# Patient Record
Sex: Female | Born: 1980
Health system: Southern US, Community
[De-identification: ages and names within clinical notes are randomized; demographics above are authoritative.]

## PROBLEM LIST (undated history)

## (undated) DIAGNOSIS — D649 Anemia, unspecified: Secondary | ICD-10-CM

## (undated) DIAGNOSIS — K922 Gastrointestinal hemorrhage, unspecified: Secondary | ICD-10-CM

## (undated) DIAGNOSIS — F419 Anxiety disorder, unspecified: Secondary | ICD-10-CM

## (undated) DIAGNOSIS — F329 Major depressive disorder, single episode, unspecified: Secondary | ICD-10-CM

## (undated) DIAGNOSIS — Z5189 Encounter for other specified aftercare: Secondary | ICD-10-CM

## (undated) DIAGNOSIS — F32A Depression, unspecified: Secondary | ICD-10-CM

## (undated) DIAGNOSIS — K529 Noninfective gastroenteritis and colitis, unspecified: Secondary | ICD-10-CM

## (undated) DIAGNOSIS — Z862 Personal history of diseases of the blood and blood-forming organs and certain disorders involving the immune mechanism: Secondary | ICD-10-CM

## (undated) DIAGNOSIS — D689 Coagulation defect, unspecified: Secondary | ICD-10-CM

## (undated) HISTORY — PX: COSMETIC SURGERY: SHX468

## (undated) HISTORY — DX: Personal history of diseases of the blood and blood-forming organs and certain disorders involving the immune mechanism: Z86.2

## (undated) HISTORY — DX: Anxiety disorder, unspecified: F41.9

## (undated) HISTORY — DX: Anemia, unspecified: D64.9

## (undated) HISTORY — DX: Encounter for other specified aftercare: Z51.89

## (undated) HISTORY — PX: OTHER SURGICAL HISTORY: SHX169

## (undated) HISTORY — PX: BREAST ENHANCEMENT SURGERY: SHX7

## (undated) HISTORY — DX: Major depressive disorder, single episode, unspecified: F32.9

## (undated) HISTORY — DX: Gastrointestinal hemorrhage, unspecified: K92.2

## (undated) HISTORY — DX: Coagulation defect, unspecified: D68.9

## (undated) HISTORY — DX: Depression, unspecified: F32.A

## (undated) HISTORY — DX: Noninfective gastroenteritis and colitis, unspecified: K52.9

---

## 2012-12-06 ENCOUNTER — Other Ambulatory Visit: Payer: Self-pay | Admitting: Physician Assistant

## 2012-12-06 DIAGNOSIS — Z83438 Family history of other disorder of lipoprotein metabolism and other lipidemia: Secondary | ICD-10-CM

## 2012-12-06 DIAGNOSIS — Z131 Encounter for screening for diabetes mellitus: Secondary | ICD-10-CM

## 2012-12-06 DIAGNOSIS — Z13220 Encounter for screening for lipoid disorders: Secondary | ICD-10-CM

## 2012-12-06 NOTE — Progress Notes (Signed)
Coming for visit tomorrow.

## 2012-12-07 ENCOUNTER — Telehealth: Payer: Self-pay | Admitting: Physician Assistant

## 2012-12-07 ENCOUNTER — Ambulatory Visit (INDEPENDENT_AMBULATORY_CARE_PROVIDER_SITE_OTHER): Payer: 59 | Admitting: Physician Assistant

## 2012-12-07 ENCOUNTER — Encounter: Payer: Self-pay | Admitting: Physician Assistant

## 2012-12-07 VITALS — BP 113/74 | HR 101 | Wt 127.0 lb

## 2012-12-07 DIAGNOSIS — G43109 Migraine with aura, not intractable, without status migrainosus: Secondary | ICD-10-CM | POA: Insufficient documentation

## 2012-12-07 DIAGNOSIS — I493 Ventricular premature depolarization: Secondary | ICD-10-CM

## 2012-12-07 DIAGNOSIS — R5381 Other malaise: Secondary | ICD-10-CM

## 2012-12-07 DIAGNOSIS — R51 Headache: Secondary | ICD-10-CM

## 2012-12-07 DIAGNOSIS — I4949 Other premature depolarization: Secondary | ICD-10-CM

## 2012-12-07 LAB — COMPREHENSIVE METABOLIC PANEL
Alkaline Phosphatase: 39 U/L (ref 39–117)
CO2: 24 mEq/L (ref 19–32)
Creat: 0.8 mg/dL (ref 0.50–1.10)
Glucose, Bld: 83 mg/dL (ref 70–99)
Total Bilirubin: 0.5 mg/dL (ref 0.3–1.2)

## 2012-12-07 LAB — LIPID PANEL
Cholesterol: 179 mg/dL (ref 0–200)
LDL Cholesterol: 107 mg/dL — ABNORMAL HIGH (ref 0–99)
Total CHOL/HDL Ratio: 3.1 Ratio
Triglycerides: 72 mg/dL (ref <150)
VLDL: 14 mg/dL (ref 0–40)

## 2012-12-07 NOTE — Patient Instructions (Addendum)
Premature Ventricular Contraction Premature ventricular contraction (PVC) is an irregularity of the heart rhythm involving extra or skipped heartbeats. In some cases, they may occur without obvious cause or heart disease. Other times, they can be caused by an electrolyte change in the blood. These need to be corrected. They can also be seen when there is not enough oxygen going to the heart. A common cause of this is plaque or cholesterol buildup. This buildup decreases the blood supply to the heart. In addition, extra beats may be caused or aggravated by:  Excessive smoking.  Alcohol consumption.  Caffeine.  Certain medications  Some street drugs. SYMPTOMS   The sensation of feeling your heart skipping a beat (palpitations).  In many cases, the person may have no symptoms. SIGNS AND TESTS   A physical examination may show an occasional irregularity, but if the PVC beats do not happen often, they may not be found on physical exam.  Blood pressure is usually normal.  Other tests that may find extra beats of the heart are:  An EKG (electrocardiogram)  A Holter monitor which can monitor your heart over longer periods of time  An Angiogram (study of the heart arteries). TREATMENT  Usually extra heartbeats do not need treatment. The condition is treated only if symptoms are severe or if extra beats are very frequent or are causing problems. An underlying cause, if discovered, may also require treatment.  Treatment may also be needed if there may be a risk for other more serious cardiac arrhythmias.  PREVENTION   Moderation in caffeine, alcohol, and tobacco use may reduce the risk of ectopic heartbeats in some people.  Exercise often helps people who lead a sedentary (inactive) lifestyle. PROGNOSIS  PVC heartbeats are generally harmless and do not need treatment.  RISKS AND COMPLICATIONS   Ventricular tachycardia (occasionally).  There usually are no complications.  Other  arrhythmias (occasionally). SEEK IMMEDIATE MEDICAL CARE IF:   You feel palpitations that are frequent or continual.  You develop chest pain or other problems such as shortness of breath, sweating, or nausea and vomiting.  You become light-headed or faint (pass out).  You get worse or do not improve with treatment. Document Released: 02/27/2004 Document Revised: 10/04/2011 Document Reviewed: 09/08/2007 [facility] Patient Information 2013 [place], [place].

## 2012-12-07 NOTE — Telephone Encounter (Signed)
Can we call lab and see if we can add CBC and TSH to labs this am.

## 2012-12-07 NOTE — Telephone Encounter (Signed)
Added TSH could not add CBC as no lavendar tube was filled. Barry Dienes, LPN

## 2012-12-07 NOTE — Telephone Encounter (Signed)
Will get POC hgb in office.

## 2012-12-07 NOTE — Progress Notes (Signed)
  Subjective:    Patient ID: Jennifer Abbott, female    DOB: 04-01-1981, 32 y.o.   MRN: [id number]  HPI Patient presents to the clinic to establish care. PMH is negative for any ongoing conditions. She does smoke a black and mild every day. She drinks alcohol occasionally. Family hx positive for hyperlipidemia and heart attacks.   She comes in today wanting blood work to screen for any cholesterol issues.   She also complains of regular headaches.They are not every day but 1-2 a week. They happen usually during work week after lunch. They are located around the top of her head and do not radiate. No n/v, light, or sound sensitive. Ibuprofen works. NO vision changes.denies stress but reports does not happen on weekends. Drinks 2 cups of coffee every morning but water throughout the day.   She has also been having some heart palpitations or feeling like heart is skipping a beat. 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 She has had these feeling for years.they are the worst when she is hung over. She denies any CP, SOB.      Review of Systems  All other systems reviewed and are negative.       Objective:   Physical Exam  Constitutional: She is oriented to person, place, and time. She appears well-developed and well-nourished.  HENT:  Head: Normocephalic and atraumatic.  Neck: Normal range of motion. Neck supple. No thyromegaly present.  Cardiovascular: Normal rate, regular rhythm and normal heart sounds.   Pulmonary/Chest: Effort normal and breath sounds normal.  Lymphadenopathy:    She has no cervical adenopathy.  Neurological: She is alert and oriented to person, place, and time.  Skin: Skin is warm and dry.  Psychiatric: She has a normal mood and affect. Her behavior is normal.          Assessment & Plan:  Screening for lipid/diabetes- sent to lab.  Headaches- They sound like simple headaches. Since ibuprofen  is working I suggest using what works. They do not sound worrisome. There could be a food, stress, dehydration trigger. Stay hydrated. Follow up if headaches worsen or ibuprofen not working.   PVC's- Gave handout on PVC's. Discuss potential causes. Will check TSH, CMP. hgb was 12.5. On low side told to start taking iron daily 325mg  and see if that helps any. If continues will send to cardiology for a holter monitor. Reassured pt.

## 2012-12-11 ENCOUNTER — Encounter: Payer: Self-pay | Admitting: *Deleted

## 2012-12-27 ENCOUNTER — Telehealth: Payer: Self-pay | Admitting: Physician Assistant

## 2012-12-27 MED ORDER — CLINDAMYCIN PHOS-BENZOYL PEROX 1-5 % EX GEL: Freq: Two times a day (BID) | CUTANEOUS | Status: DC

## 2012-12-27 NOTE — Telephone Encounter (Signed)
Pt calls in with acne. Would like a cream called in.

## 2012-12-29 ENCOUNTER — Other Ambulatory Visit: Payer: Self-pay | Admitting: Physician Assistant

## 2012-12-29 ENCOUNTER — Ambulatory Visit (INDEPENDENT_AMBULATORY_CARE_PROVIDER_SITE_OTHER): Payer: 59 | Admitting: Physician Assistant

## 2012-12-29 VITALS — BP 98/64 | HR 71

## 2012-12-29 DIAGNOSIS — R34 Anuria and oliguria: Secondary | ICD-10-CM

## 2012-12-29 DIAGNOSIS — N390 Urinary tract infection, site not specified: Secondary | ICD-10-CM

## 2012-12-29 LAB — POCT URINALYSIS DIPSTICK
Bilirubin, UA: NEGATIVE
Ketones, UA: NEGATIVE
Spec Grav, UA: >=1.03
pH, UA: 6

## 2012-12-29 MED ORDER — CIPROFLOXACIN HCL 500 MG PO TABS: 500.0000 mg | ORAL_TABLET | Freq: Two times a day (BID) | ORAL | Status: DC

## 2012-12-29 NOTE — Progress Notes (Signed)
  Subjective:    Patient ID: Jennifer Abbott, female    DOB: 05-Jul-1981, 32 y.o.   MRN: [id number]  HPI Patient presents to the clinic with decreased urine output for 2 days. She has also felt very fatigued and worn down today. She denies fever, chills, abdominal or back pain. She has not tried anything to make better. She does have a history of getting UTI's after sex. She had sex earlier this week for the first time in a while. She denies any n/v/d.    Review of Systems     Objective:   Physical Exam  Constitutional: She is oriented to person, place, and time. She appears well-developed and well-nourished.  HENT:  Head: Normocephalic and atraumatic.  Cardiovascular: Normal rate, regular rhythm and normal heart sounds.   Pulmonary/Chest: Effort normal and breath sounds normal.  Negative for CVA tenderness.  Abdominal: Soft. There is no tenderness.  Neurological: She is alert and oriented to person, place, and time.  Skin: Skin is warm and dry.  Psychiatric: She has a normal mood and affect. Her behavior is normal.          Assessment & Plan:  UTI- UA positive for blood and leuks. Treated with cipro for 3 days. HO given for symptomatic care. Follow up if not improving.

## 2013-01-15 ENCOUNTER — Other Ambulatory Visit: Payer: Self-pay | Admitting: Physician Assistant

## 2013-01-15 DIAGNOSIS — N939 Abnormal uterine and vaginal bleeding, unspecified: Secondary | ICD-10-CM

## 2013-01-15 DIAGNOSIS — R5383 Other fatigue: Secondary | ICD-10-CM

## 2013-01-15 LAB — VITAMIN B12: Vitamin B-12: 644 pg/mL (ref 211–911)

## 2013-01-15 LAB — IRON AND TIBC
%SAT: 26 % (ref 20–55)
TIBC: 368 ug/dL (ref 250–470)

## 2013-04-07 ENCOUNTER — Emergency Department (INDEPENDENT_AMBULATORY_CARE_PROVIDER_SITE_OTHER)
Admission: EM | Admit: 2013-04-07 | Discharge: 2013-04-07 | Disposition: A | Discharge status: Home / Self Care | Payer: 59 | Source: Home / Self Care | Attending: Family Medicine | Admitting: Family Medicine

## 2013-04-07 ENCOUNTER — Encounter: Payer: Self-pay | Admitting: Emergency Medicine

## 2013-04-07 DIAGNOSIS — J029 Acute pharyngitis, unspecified: Secondary | ICD-10-CM

## 2013-04-07 MED ORDER — PENICILLIN V POTASSIUM 500 MG PO TABS: ORAL_TABLET | ORAL | Status: DC

## 2013-04-07 NOTE — ED Notes (Signed)
Reports onset of sore throat, headache, general aches, and fever 24 hours ago; works in [employer]. Took 1Gm tylenol 2 hours ago.

## 2013-04-07 NOTE — ED Provider Notes (Signed)
CSN: [id number]     Arrival date & time 04/07/13  1632 History   First MD Initiated Contact with Patient 04/07/13 1650     Chief Complaint  Patient presents with  . Sore Throat  . Generalized Body Aches  . Fever  . Headache     HPI Comments: Patient complains of onset of sore throat, headache, myalgias, and fever yesterday without sinus congestion or cough.  She noticed a rash developing on her arms today.  The history is provided by the patient.    History reviewed. No pertinent past medical history. Past Surgical History  Procedure Laterality Date  . Ovarirectomy       right for cyst  . Breast enhancement surgery Bilateral     2003   Family History  Problem Relation Age of Onset  . Hyperlipidemia Father   . Hypertension Father   . Heart attack Father   . Diabetes Maternal Grandfather   . Heart attack Paternal Grandfather    History  Substance Use Topics  . Smoking status: Current Every Day Smoker  . Smokeless tobacco: Not on file  . Alcohol Use: Not on file   OB History   Grav Para Term Preterm Abortions TAB SAB Ect Mult Living                 Review of Systems + sore throat No cough No pleuritic pain No wheezing No nasal congestion No post-nasal drainage No sinus pain/pressure No itchy/red eyes No earache No hemoptysis No SOB + fever, + chills No nausea No vomiting No abdominal pain No diarrhea No urinary symptoms + skin rash today + fatigue + myalgias + headache Used OTC meds without relief  Allergies  Review of patient's allergies indicates no known allergies.  Home Medications   Current Outpatient Rx  Name  Route  Sig  Dispense  Refill  . Biotin 800 MCG TABS   Oral   Take 1,600 mcg by mouth.         . ciprofloxacin (CIPRO) 500 MG tablet   Oral   Take 1 tablet (500 mg total) by mouth 2 (two) times daily. For 3 days.   6 tablet   0   . clindamycin-benzoyl peroxide (BENZACLIN) gel   Topical   Apply topically 2 (two) times  daily.   25 g   0   . Multiple Vitamin (MULTIVITAMIN) tablet   Oral   Take 1 tablet by mouth daily.         . penicillin v potassium (VEETID) 500 MG tablet      Take one tab by mouth twice daily for 10 days   20 tablet   0    BP 92/65  Pulse 107  Temp(Src) 98.3 F (36.8 C) (Oral)  Resp 16  Ht 5\' 5"  (1.651 m)  Wt 129 lb (58.514 kg)  BMI 21.47 kg/m2  SpO2 98% Physical Exam Nursing notes and Vital Signs reviewed. Appearance:  Patient appears healthy, stated age, and in no acute distress Eyes:  Pupils are equal, round, and reactive to light and accomodation.  Extraocular movement is intact.  Conjunctivae are not inflamed  Ears:  Canals normal.  Tympanic membranes normal.  Nose:  Normal turbinates.  No sinus tenderness.    Pharynx:  Erythematous right pharynx; no exudate Neck:  Supple.   Tender enlarged right anterior tonsillar node. Lungs:  Clear to auscultation.  Breath sounds are equal.  Heart:  Regular rate and rhythm without murmurs, rubs, or gallops.  Abdomen:  Nontender without masses or hepatosplenomegaly.  Bowel sounds are present.  No CVA or flank tenderness.  Extremities:  No edema.  No calf tenderness Skin:  Faint macular erythema on forearms, neck, and.flexural creases of arms   ED Course  Procedures  none    Labs Reviewed  STREP A DNA PROBE pending  POCT RAPID STREP A (OFFICE) negative       MDM   1. Acute pharyngitis; ?false negative strep    Throat culture pending. Begin penicillin. Take Ibuprofen 200mg , 4 tabs every 8 hours with food.  Try warm salt water gargles. Followup with Family Doctor if not improved in about 5 days.    Lattie Haw, MD 04/08/13 1256

## 2013-04-10 ENCOUNTER — Telehealth: Payer: Self-pay | Admitting: *Deleted

## 2013-04-27 ENCOUNTER — Other Ambulatory Visit: Payer: Self-pay | Admitting: Physician Assistant

## 2013-04-27 MED ORDER — PREDNISONE 20 MG PO TABS: ORAL_TABLET | ORAL | Status: DC

## 2013-05-07 ENCOUNTER — Other Ambulatory Visit: Payer: Self-pay | Admitting: Physician Assistant

## 2013-05-28 ENCOUNTER — Encounter: Payer: Self-pay | Admitting: Sports Medicine

## 2013-05-28 ENCOUNTER — Ambulatory Visit (INDEPENDENT_AMBULATORY_CARE_PROVIDER_SITE_OTHER): Payer: 59

## 2013-05-28 ENCOUNTER — Other Ambulatory Visit: Payer: Self-pay | Admitting: Sports Medicine

## 2013-05-28 ENCOUNTER — Ambulatory Visit (INDEPENDENT_AMBULATORY_CARE_PROVIDER_SITE_OTHER): Payer: 59 | Admitting: Sports Medicine

## 2013-05-28 VITALS — BP 96/70 | HR 85 | Resp 18 | Wt 128.0 lb

## 2013-05-28 DIAGNOSIS — M79672 Pain in left foot: Secondary | ICD-10-CM

## 2013-05-28 DIAGNOSIS — S92302A Fracture of unspecified metatarsal bone(s), left foot, initial encounter for closed fracture: Secondary | ICD-10-CM | POA: Insufficient documentation

## 2013-05-28 DIAGNOSIS — I498 Other specified cardiac arrhythmias: Secondary | ICD-10-CM | POA: Insufficient documentation

## 2013-05-28 DIAGNOSIS — S92309A Fracture of unspecified metatarsal bone(s), unspecified foot, initial encounter for closed fracture: Secondary | ICD-10-CM

## 2013-05-28 DIAGNOSIS — I4949 Other premature depolarization: Secondary | ICD-10-CM

## 2013-05-28 DIAGNOSIS — M79609 Pain in unspecified limb: Secondary | ICD-10-CM

## 2013-05-28 DIAGNOSIS — M7989 Other specified soft tissue disorders: Secondary | ICD-10-CM

## 2013-05-28 DIAGNOSIS — I493 Ventricular premature depolarization: Secondary | ICD-10-CM

## 2013-05-28 MED ORDER — NEBIVOLOL HCL 5 MG PO TABS: 5.0000 mg | ORAL_TABLET | Freq: Every day | ORAL | Status: DC

## 2013-05-28 NOTE — Assessment & Plan Note (Addendum)
After inversion injury. I am oriented she does have a fracture through the base of the fifth metatarsal. X-rays.  The foot is broken, I strapped it with compressive dressing, she would be a candidate for a cast boot, we will discuss this in person, she is a nurse here at the office.

## 2013-05-28 NOTE — Progress Notes (Signed)
  Subjective:    CC: Foot pain  HPI: This is a very pleasant 32 year old female, 2 days ago she inverted her left foot, she had immediate pain, swelling, bruising, with pain localized over the base of the fifth metatarsal bone without radiation. Bruising has been persistent and progressive. Pain is moderate to severe. No numbness or tingling, no pain over the medial or lateral malleoli.  PVCs: Still having some PVCs on low dose beta blocker/bystolic. Can up titrate this to affect with PCP.   Past medical history, Surgical history, Family history not pertinant except as noted below, Social history, Allergies, and medications have been entered into the medical record, reviewed, and no changes needed.   Review of Systems: No fevers, chills, night sweats, weight loss, chest pain, or shortness of breath.   Objective:    General: Well Developed, well nourished, and in no acute distress.  Neuro: Alert and oriented x3, extra-ocular muscles intact, sensation grossly intact.  HEENT: Normocephalic, atraumatic, pupils equal round reactive to light, neck supple, no masses, no lymphadenopathy, thyroid nonpalpable.  Skin: Warm and dry, no rashes. Cardiac: Regular rate and rhythm, no murmurs rubs or gallops, no lower extremity edema.  Respiratory: Clear to auscultation bilaterally. Not using accessory muscles, speaking in full sentences. Left Foot: Bruised and swollen. Range of motion is full in all directions. Strength is 5/5 in all directions. No hallux valgus. No pes cavus or pes planus. No abnormal callus noted. No pain over the navicular prominence, or base of fifth metatarsal. No tenderness to palpation of the calcaneal insertion of plantar fascia. No pain at the Achilles insertion. No pain over the calcaneal bursa. No pain of the retrocalcaneal bursa. Tender to palpation at the base of the fifth metatarsal shaft. No hallux rigidus or limitus. No tenderness palpation over interphalangeal  joints. No pain with compression of the metatarsal heads. Neurovascularly intact distally.  Foot was strapped with compressive dressing.  X-rays were personally reviewed, there does appear to be a fracture through the base of the fifth metatarsal bone in the avulsion region, this is only seen a single image, and was discussed with the radiologist.  Impression and Recommendations:

## 2013-05-28 NOTE — Assessment & Plan Note (Signed)
Continue low dose Bystolic, titrate to effect.

## 2013-06-18 ENCOUNTER — Encounter: Payer: Self-pay | Admitting: Physician Assistant

## 2013-06-18 ENCOUNTER — Ambulatory Visit (INDEPENDENT_AMBULATORY_CARE_PROVIDER_SITE_OTHER): Payer: 59 | Admitting: Physician Assistant

## 2013-06-18 VITALS — BP 95/58 | HR 98 | Ht 66.0 in | Wt 127.0 lb

## 2013-06-18 DIAGNOSIS — I4949 Other premature depolarization: Secondary | ICD-10-CM

## 2013-06-18 DIAGNOSIS — D229 Melanocytic nevi, unspecified: Secondary | ICD-10-CM

## 2013-06-18 DIAGNOSIS — Z0000 Encounter for general adult medical examination without abnormal findings: Secondary | ICD-10-CM

## 2013-06-18 DIAGNOSIS — I493 Ventricular premature depolarization: Secondary | ICD-10-CM

## 2013-06-18 DIAGNOSIS — D239 Other benign neoplasm of skin, unspecified: Secondary | ICD-10-CM

## 2013-06-18 DIAGNOSIS — D509 Iron deficiency anemia, unspecified: Secondary | ICD-10-CM

## 2013-06-18 NOTE — Patient Instructions (Addendum)

## 2013-06-18 NOTE — Progress Notes (Signed)
  Subjective:     Jennifer Abbott is a 32 y.o. female and is here for a comprehensive physical exam. The patient reports no problems.  PVC's ongoing- stopped byostolic because did not feel was helping. Happens worse when laying down and resting.   History   Social History  . Marital Status: Single    Spouse Name: N/A    Number of Children: N/A  . Years of Education: N/A   Occupational History  . Not on file.   Social History Main Topics  . Smoking status: Current Some Day Smoker  . Smokeless tobacco: Not on file  . Alcohol Use: Not on file  . Drug Use: Not on file  . Sexual Activity: No   Other Topics Concern  . Not on file   Social History Narrative  . No narrative on file   Health Maintenance  Topic Date Due  . Influenza Vaccine  02/23/2014  . Pap Smear  01/24/2016  . Tetanus/tdap  07/26/2021    The following portions of the patient's history were reviewed and updated as appropriate: allergies, current medications, past family history, past medical history, past social history, past surgical history and problem list.  Review of Systems A comprehensive review of systems was negative.   Objective:    BP 95/58  Pulse 98  Ht 5\' 6"  (1.676 m)  Wt 127 lb (57.607 kg)  BMI 20.51 kg/m2  LMP 05/14/2013 General appearance: alert, cooperative and appears stated age Head: Normocephalic, without obvious abnormality, atraumatic Eyes: conjunctivae/corneas clear. PERRL, EOM's intact. Fundi benign. Ears: normal TM's and external ear canals both ears Nose: Nares normal. Septum midline. Mucosa normal. No drainage or sinus tenderness. Throat: lips, mucosa, and tongue normal; teeth and gums normal Neck: no adenopathy, no carotid bruit, no JVD, supple, symmetrical, trachea midline and thyroid not enlarged, symmetric, no tenderness/mass/nodules Back: symmetric, no curvature. ROM normal. No CVA tenderness. Lungs: clear to auscultation bilaterally Heart: regular rate and rhythm, S1, S2  normal, no murmur, click, rub or gallop Abdomen: soft, non-tender; bowel sounds normal; no masses,  no organomegaly Extremities: extremities normal, atraumatic, no cyanosis or edema Pulses: 2+ and symmetric Skin: Skin color, texture, turgor normal. No rashes or lesions or atypical pigmented mole on right lower back. Other moles scattered with some atypia. Lymph nodes: Cervical, supraclavicular, and axillary nodes normal. Neurologic: Grossly normal    Assessment:    Healthy female exam.      Plan:    CPE- Flu shot, Tdap other immunizations up to date. Pap smear done this year. Gave HO for health maintence. Discussed regular exercise and low fat diet. Recommended calcium and vitamin D 1200mg  and 800mg .   IDA- pt is not taking ferrous sulfate due to GI side effects.   PVCS- pt only got to 10mg  of byostolic before stopping. Encourage her to start at 10mg  daily. Check BP and pulse to make sure not dropping too low and then will consider increasing to 20mg . Perhaps pt would response better to higher dose of beta blocker.   Atypical mole- schedule appt to have mole removed and sent for biopsy.  See After Visit Summary for Counseling Recommendations

## 2013-07-23 ENCOUNTER — Other Ambulatory Visit: Payer: Self-pay | Admitting: Physician Assistant

## 2013-07-23 ENCOUNTER — Ambulatory Visit (INDEPENDENT_AMBULATORY_CARE_PROVIDER_SITE_OTHER): Payer: 59 | Admitting: Physician Assistant

## 2013-07-23 ENCOUNTER — Encounter: Payer: Self-pay | Admitting: Physician Assistant

## 2013-07-23 VITALS — BP 131/86 | HR 88 | Temp 98.3°F | Wt 130.0 lb

## 2013-07-23 DIAGNOSIS — J329 Chronic sinusitis, unspecified: Secondary | ICD-10-CM

## 2013-07-23 DIAGNOSIS — J069 Acute upper respiratory infection, unspecified: Secondary | ICD-10-CM

## 2013-07-23 MED ORDER — AMOXICILLIN 500 MG PO TABS: 500.0000 mg | ORAL_TABLET | Freq: Two times a day (BID) | ORAL | Status: DC

## 2013-07-23 NOTE — Patient Instructions (Signed)

## 2013-07-23 NOTE — Progress Notes (Signed)
   Subjective:    Patient ID: Jennifer Abbott, female    DOB: Dec 27, 1980, 32 y.o.   MRN: [id number]  HPI Pt is a 32 yo female who presents to the clinic with 3 days of sinus pressure, ST, and feeling bad. She reported some teeth pain on Friday. Saturday teeth pain went away but she got a headache. Now she has lots of sinus pressure and feel fatigue and worn down. She does have ST. She continues to eat and drink. She denies fever, chills, n/v/d, ear pain. She has not tried anything to make better. Seems to be getting worse. She denies a mild non productive cough.   Review of Systems     Objective:   Physical Exam  Constitutional: She is oriented to person, place, and time. She appears well-developed and well-nourished.  HENT:  Head: Normocephalic and atraumatic.  Right Ear: External ear normal.  Left Ear: External ear normal.  Nose: Nose normal.  TM's clear.  Oropharynx erythematous with 1+ bilateral swollen tonsils with no exudate.   Frontal and maxillary tenderness to palpation. No illumination of light in frontal and minimal in maxillary sinuses.   Eyes: Conjunctivae are normal.  Neck: Normal range of motion. Neck supple.  Anterior cervical lymphnodes swollen but not tender bilaterally.   Cardiovascular: Normal rate, regular rhythm and normal heart sounds.   Pulmonary/Chest: Effort normal and breath sounds normal. She has no wheezes.  Neurological: She is alert and oriented to person, place, and time.  Skin: Skin is warm and dry.  Psychiatric: She has a normal mood and affect. Her behavior is normal.          Assessment & Plan:  Sinusitis/URI- Discussed with pt that I feel like not bacterial at this point. She has not tired anything and discussed trying mucinex, flonase, sinus rinses, ibuprofen and tylenol. If not improving or worsening in next 48 hours gave amoxil to take for 10 days.

## 2013-10-04 ENCOUNTER — Other Ambulatory Visit: Payer: Self-pay | Admitting: *Deleted

## 2013-10-04 MED ORDER — FLUCONAZOLE 150 MG PO TABS: 150.0000 mg | ORAL_TABLET | Freq: Once | ORAL | Status: DC

## 2013-11-02 ENCOUNTER — Ambulatory Visit (INDEPENDENT_AMBULATORY_CARE_PROVIDER_SITE_OTHER): Payer: 59 | Admitting: Physician Assistant

## 2013-11-02 ENCOUNTER — Encounter: Payer: Self-pay | Admitting: Physician Assistant

## 2013-11-02 VITALS — BP 94/63 | HR 103 | Temp 98.2°F | Wt 132.0 lb

## 2013-11-02 DIAGNOSIS — J069 Acute upper respiratory infection, unspecified: Secondary | ICD-10-CM

## 2013-11-02 DIAGNOSIS — J329 Chronic sinusitis, unspecified: Secondary | ICD-10-CM

## 2013-11-02 DIAGNOSIS — J029 Acute pharyngitis, unspecified: Secondary | ICD-10-CM

## 2013-11-02 MED ORDER — AMOXICILLIN 500 MG PO CAPS: 500.0000 mg | ORAL_CAPSULE | Freq: Two times a day (BID) | ORAL | Status: DC

## 2013-11-02 NOTE — Progress Notes (Addendum)
   Subjective:    Patient ID: Jennifer Abbott, female    DOB: [birth year], 33 y.o.   MRN: [id number]  HPI Pt is a 33 yo female who presents to the clinic with 21 days of sinus pressure, ST, bilateral ear pressure. She works here in office. She felt bad a week ago but she felt like her symptoms have resolved. Acutely yesterday she started feeling worse.  ST is most concerning symptom but feels pressure behind eyes and face. . No other sick contacts. Mild cough but when she coughs feels like something is pushing through her right lower ribs. Cough not productive. Tried sudafed and tylenol with minimal relief. No fever, chills, nausea, vomiting, wheezing, or SOB.    Review of Systems     Objective:   Physical Exam  Constitutional: She is oriented to person, place, and time. She appears well-developed and well-nourished.  HENT:  Head: Normocephalic and atraumatic.  Right TM slightly buldging with some dullness accumulating in lower TM.   Tenderness over nasal bridge and frontal sinus. With palpation she almost feels like relieves pressure. No transillumination with light source of frontal sinuses.   Oropharynx erythematous. Tonsils not swollen, no exudate. PND present.   Bilateral nares pale turbinates and patent bilaterally.   Eyes: Conjunctivae are normal. Right eye exhibits no discharge. Left eye exhibits no discharge.  Neck: Normal range of motion. Neck supple.  Tender enlarged anterior cervical adenopathy bilaterally.   Cardiovascular: Normal rate, regular rhythm and normal heart sounds.   Pulmonary/Chest: Effort normal and breath sounds normal. She has no wheezes.  Palpated right ribs with no sign of herniation/buldge.   Neurological: She is alert and oriented to person, place, and time.  Skin: Skin is dry.  Psychiatric: She has a normal mood and affect. Her behavior is normal.          Assessment & Plan:  URI/sinusitis/acute pharyngitis- short duration could most certainly be URI  with acute pharyngitis;however, it seems like she could be experiencing a secondary sickening consistent with sinus infection. Will treat with amoxil. Discussed adding mucinex. Discussed medrol dose pk but pt does not want to try prednisone. Call if not improving. Discussed symptomatic control of ST.   Cough- discussed with patient muscle spasm/costochondritis can occur with forceful coughing. Continue to monitor. No alarming signs today. Consider OtC cough syrup.

## 2013-11-20 ENCOUNTER — Other Ambulatory Visit: Payer: Self-pay | Admitting: Physician Assistant

## 2013-11-22 ENCOUNTER — Ambulatory Visit (INDEPENDENT_AMBULATORY_CARE_PROVIDER_SITE_OTHER): Payer: 59 | Admitting: Sports Medicine

## 2013-11-22 ENCOUNTER — Encounter: Payer: Self-pay | Admitting: Sports Medicine

## 2013-11-22 VITALS — BP 108/70 | HR 85 | Wt 134.0 lb

## 2013-11-22 DIAGNOSIS — R51 Headache: Secondary | ICD-10-CM

## 2013-11-22 DIAGNOSIS — IMO0002 Reserved for concepts with insufficient information to code with codable children: Secondary | ICD-10-CM

## 2013-11-22 MED ORDER — DOXYCYCLINE HYCLATE 100 MG PO TABS: 100.0000 mg | ORAL_TABLET | Freq: Two times a day (BID) | ORAL | Status: AC

## 2013-11-22 MED ORDER — TOPIRAMATE 50 MG PO TABS: ORAL_TABLET | ORAL | Status: DC

## 2013-11-22 NOTE — Progress Notes (Signed)
  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 Symptoms are moderate, persistent.  Headaches: She gets these occasionally, there are various triggers, the headaches are excruciating, typically start in her neck, and radiating to the head, throbbing, associated with nausea, no localizing or focal neurologic symptoms, no visual symptoms. She has no aura. They last for hours, and then resolve. No head trauma. She does tend to prefer a dark quiet room and she gets these headaches.  Past medical history, Surgical history, Family history not pertinant except as noted below, Social history, Allergies, and medications have been entered into the medical record, reviewed, and no changes needed.   Review of Systems: No fevers, chills, night sweats, weight loss, chest pain, or shortness of breath.   Objective:    General: Well Developed, well nourished, and in no acute distress.  Neuro: Alert and oriented x3, extra-ocular muscles intact, sensation grossly intact. Cranial nerves II through XII are intact, motor, sensory, and coordination functions are all intact. Funduscopy was normal. HEENT: Normocephalic, atraumatic, pupils equal round reactive to light, neck supple, no masses, no lymphadenopathy, thyroid nonpalpable.  Skin: Warm and dry, no rashes. There is a paronychia on the ulnar aspect of the right ring finger. There is no purulence, is only erythematous and swollen. Cardiac: Regular rate and rhythm, no murmurs rubs or gallops, no lower extremity edema.  Respiratory: Clear to auscultation bilaterally. Not using accessory muscles, speaking in full sentences.  Impression and Recommendations:

## 2013-11-22 NOTE — Assessment & Plan Note (Addendum)
Headaches do seem migrainous with various triggers. We are going to start Topamax in an up taper for preventative purposes. With a normal neurologic exam, and no warning signs it is unlikely this represents headaches due to subarachnoid hemorrhage, or vascular aneurysms, if headaches persist despite treatment, we certainly do want to consider brain MRI.

## 2013-11-22 NOTE — Patient Instructions (Signed)
Paronychia Paronychia is an inflammatory reaction involving the folds of the skin surrounding the fingernail. This is commonly caused by an infection in the skin around a nail. The most common cause of paronychia is frequent wetting of the hands (as seen with bartenders, food servers, nurses or others who wet their hands). This makes the skin around the fingernail susceptible to infection by bacteria (germs) or fungus. Other predisposing factors are:  Aggressive manicuring.  Nail biting.  Thumb sucking. The most common cause is a staphylococcal (a type of germ) infection, or a fungal (Candida) infection. When caused by a germ, it usually comes on suddenly with redness, swelling, pus and is often painful. It may get under the nail and form an abscess (collection of pus), or form an abscess around the nail. If the nail itself is infected with a fungus, the treatment is usually prolonged and may require oral medicine for up to one year. Your caregiver will determine the length of time treatment is required. The paronychia caused by bacteria (germs) may largely be avoided by not pulling on hangnails or picking at cuticles. When the infection occurs at the tips of the finger it is called felon. When the cause of paronychia is from the herpes simplex virus (HSV) it is called herpetic whitlow. TREATMENT  When an abscess is present treatment is often incision and drainage. This means that the abscess must be cut open so the pus can get out. When this is done, the following home care instructions should be followed. HOME CARE INSTRUCTIONS   It is important to keep the affected fingers very dry. Rubber or plastic gloves over cotton gloves should be used whenever the hand must be placed in water.  Keep wound clean, dry and dressed as suggested by your caregiver between warm soaks or warm compresses.  Soak in warm water for fifteen to twenty minutes three to four times per day for bacterial infections. Fungal  infections are very difficult to treat, so often require treatment for long periods of time.  For bacterial (germ) infections take antibiotics (medicine which kill germs) as directed and finish the prescription, even if the problem appears to be solved before the medicine is gone.  Only take over-the-counter or prescription medicines for pain, discomfort, or fever as directed by your caregiver. SEEK IMMEDIATE MEDICAL CARE IF:  You have redness, swelling, or increasing pain in the wound.  You notice pus coming from the wound.  You have a fever.  You notice a bad smell coming from the wound or dressing. Document Released: 01/05/2001 Document Revised: 10/04/2011 Document Reviewed: 09/06/2008 [employer] Patient Information 2014 [employer].  

## 2013-11-22 NOTE — Assessment & Plan Note (Signed)
Paronychia is located on the ulnar aspect of the right ring finger. We will start course of doxycycline, continue topical compresses and massage.

## 2013-12-20 ENCOUNTER — Other Ambulatory Visit: Payer: Self-pay | Admitting: Sports Medicine

## 2013-12-20 DIAGNOSIS — N3090 Cystitis, unspecified without hematuria: Secondary | ICD-10-CM | POA: Insufficient documentation

## 2013-12-20 MED ORDER — CEPHALEXIN 500 MG PO CAPS: 500.0000 mg | ORAL_CAPSULE | Freq: Two times a day (BID) | ORAL | Status: DC

## 2013-12-20 MED ORDER — PHENAZOPYRIDINE HCL 200 MG PO TABS: 200.0000 mg | ORAL_TABLET | Freq: Three times a day (TID) | ORAL | Status: AC

## 2013-12-20 NOTE — Assessment & Plan Note (Signed)
With leukocytes, blood, dysuria. Keflex, Pyridium. Return as needed.

## 2014-01-07 ENCOUNTER — Ambulatory Visit (INDEPENDENT_AMBULATORY_CARE_PROVIDER_SITE_OTHER): Payer: 59 | Admitting: Physician Assistant

## 2014-01-07 ENCOUNTER — Encounter: Payer: Self-pay | Admitting: Physician Assistant

## 2014-01-07 VITALS — BP 92/60 | HR 96 | Wt 133.0 lb

## 2014-01-07 DIAGNOSIS — D229 Melanocytic nevi, unspecified: Secondary | ICD-10-CM

## 2014-01-07 DIAGNOSIS — D485 Neoplasm of uncertain behavior of skin: Secondary | ICD-10-CM

## 2014-01-07 NOTE — Progress Notes (Deleted)
   Subjective:    Patient ID: Jennifer Abbott, female    DOB: [birth year], 33 y.o.   MRN: [id number]  HPI    Review of Systems     Objective:   Physical Exam        Assessment & Plan:  Punch Biopsy Procedure Note  Pre-operative Diagnosis: {lesions:10351}  Post-operative Diagnosis: {Condition:17813}  Locations:{description:15099} {body part:32401}  Indications: ***  Anesthesia: {RFFMB:84665} {BICARB:16398}  Procedure Details  History of allergy to iodine: {YES***/NO:17258} Patient informed of the risks (including bleeding and infection) and benefits of the  procedure and {VERBAL:16408} informed consent obtained.  The lesion and surrounding area was given a sterile prep using {SKIN PREP:16616} and draped in the usual sterile fashion. The skin was then stretched perpendicular to the skin tension lines and the lesion removed using the ***mm punch. The resulting ellipse was then closed. The wound was closed with {ED SUTURE SIZE:11070} {SUTURE:16395} using {STITCH:16399}. Antibiotic ointment and a sterile dressing applied. The specimen {LDJ:57017} sent for pathologic examination. The patient tolerated the procedure well.  EBL: *** ml  Findings: ***  Condition: Stable  Complications: {BLTJQZESPQZRA:07622}.  Plan: 1. Instructed to keep the wound dry and covered for 24-48h and clean thereafter. 2. Warning signs of infection were reviewed.   3. Recommended that the patient use {PAIN MEDS:16413} as needed for pain.  4. Return for suture removal in {NUMBERS; 0-10:33138} {time units w/ wo plural:11}.

## 2014-01-08 NOTE — Progress Notes (Signed)
   Subjective:    Patient ID: Jennifer Abbott, female    DOB: 1981/01/27, 33 y.o.   MRN: [id number]  HPI Patient comes in to have an atypical nevus removed off her right lower abdomen/flank area. She's had history of abnormal cells but no skin cancer diagnosis. She tans and sunbathes regularly and more in the summer. She has multiple new moles that have popped up.    Review of Systems  All other systems reviewed and are negative.      Objective:   Physical Exam  Abdominal:            Assessment & Plan:  Atypical nevus- biopsy sent today.  Shave Biopsy Procedure Note  Pre-operative Diagnosis: Suspicious lesion  Post-operative Diagnosis: same  Locations:right lower abdomen  Indications: suspicous  Anesthesia: Lidocaine 1% without epinephrine without added sodium bicarbonate  Procedure Details  History of allergy to iodine: no  Patient informed of the risks (including bleeding and infection) and benefits of the  procedure and Verbal informed consent obtained.  The lesion and surrounding area were given a sterile prep using alcohol and draped in the usual sterile fashion. A scalpel was used to shave an area of skin approximately 1cm by 1cm.  Hemostasis achieved with alumuninum chloride. Antibiotic ointment and a sterile dressing applied.  The specimen was sent for pathologic examination. The patient tolerated the procedure well.  EBL: scant ml  Findings:  Condition: Stable  Complications: none.  Plan: 1. Instructed to keep the wound dry and covered for 24-48h and clean thereafter. 2. Warning signs of infection were reviewed.   3. Recommended that the patient use OTC acetaminophen as needed for pain.

## 2014-01-09 ENCOUNTER — Other Ambulatory Visit: Payer: Self-pay | Admitting: Physician Assistant

## 2014-01-09 DIAGNOSIS — D239 Other benign neoplasm of skin, unspecified: Secondary | ICD-10-CM

## 2014-01-23 ENCOUNTER — Other Ambulatory Visit: Payer: Self-pay | Admitting: Sports Medicine

## 2014-01-23 DIAGNOSIS — N760 Acute vaginitis: Secondary | ICD-10-CM | POA: Insufficient documentation

## 2014-01-23 MED ORDER — FLUCONAZOLE 150 MG PO TABS: 150.0000 mg | ORAL_TABLET | Freq: Once | ORAL | Status: DC

## 2014-01-23 NOTE — Assessment & Plan Note (Signed)
Discharge, odor, irritation. Diflucan per patient request, declines wet prep, flagyl if no response.

## 2014-02-10 ENCOUNTER — Other Ambulatory Visit: Payer: Self-pay | Admitting: Sports Medicine

## 2014-02-10 MED ORDER — AMOXICILLIN-POT CLAVULANATE 875-125 MG PO TABS: 1.0000 | ORAL_TABLET | Freq: Two times a day (BID) | ORAL | Status: DC

## 2014-02-10 NOTE — Progress Notes (Signed)
Increasing sinus pressure and pain. tx with amox x10d.

## 2014-02-13 ENCOUNTER — Ambulatory Visit (INDEPENDENT_AMBULATORY_CARE_PROVIDER_SITE_OTHER): Payer: 59 | Admitting: Sports Medicine

## 2014-02-13 VITALS — BP 108/70 | HR 80 | Temp 97.6°F | Resp 18 | Wt 134.0 lb

## 2014-02-13 DIAGNOSIS — S60459A Superficial foreign body of unspecified finger, initial encounter: Secondary | ICD-10-CM

## 2014-02-13 NOTE — Progress Notes (Signed)
  Subjective:    CC: Foreign body right thumb  HPI: This is a pleasant 33 year old female, for the past couple of days she's had a painful foreign body that appears to be a splinter in her right thumb over the metacarpophalangeal joint. There is pain, and loss of function, moderate, persistent.  Past medical history, Surgical history, Family history not pertinant except as noted below, Social history, Allergies, and medications have been entered into the medical record, reviewed, and no changes needed.   Review of Systems: No fevers, chills, night sweats, weight loss, chest pain, or shortness of breath.   Objective:    General: Well Developed, well nourished, and in no acute distress.  Neuro: Alert and oriented x3, extra-ocular muscles intact, sensation grossly intact.  HEENT: Normocephalic, atraumatic, pupils equal round reactive to light, neck supple, no masses, no lymphadenopathy, thyroid nonpalpable.  Skin: Warm and dry, no rashes. Cardiac: Regular rate and rhythm, no murmurs rubs or gallops, no lower extremity edema.  Respiratory: Clear to auscultation bilaterally. Not using accessory muscles, speaking in full sentences. Right hand: There is a visible shard, appears to be a splinter, in the skin overlying the first metacarpophalangeal joint.  Procedure:  Removal of foreign body. Risks, benefits, alternatives explained to patient. Consent obtained. Time out conducted. Noted no overlying induration or erythema at site of injection. A small amount of lidocaine infiltrated under and around the foreign body for local anesthesia. An incision was made over the foreign body with an 18-gauge needle and using both sharp and blunt dissection, the foreign body was successfully removed. Antibiotic ointment applied. Wound dressed. Advised to return if increased redness, swelling, drainage, fevers, or chills.  Impression and Recommendations:

## 2014-02-13 NOTE — Assessment & Plan Note (Signed)
Removed with both sharp and blunt dissection. Return as needed. Up-to-date on tetanus vaccination.

## 2014-02-14 ENCOUNTER — Ambulatory Visit (INDEPENDENT_AMBULATORY_CARE_PROVIDER_SITE_OTHER): Payer: 59 | Admitting: Sports Medicine

## 2014-02-14 DIAGNOSIS — L909 Atrophic disorder of skin, unspecified: Secondary | ICD-10-CM

## 2014-02-14 DIAGNOSIS — L919 Hypertrophic disorder of the skin, unspecified: Secondary | ICD-10-CM

## 2014-02-14 DIAGNOSIS — L918 Other hypertrophic disorders of the skin: Secondary | ICD-10-CM | POA: Insufficient documentation

## 2014-02-14 NOTE — Assessment & Plan Note (Signed)
Removal with Hyfrecator. Return as needed.

## 2014-02-14 NOTE — Progress Notes (Signed)
  Subjective:    CC: Skin lesion  HPI: Breezy complains of the left shoulder skin lesion present for some time now, it gets in the way of her bra strap, catches, and causes pain. Symptoms are moderate, persistent. Has been present for a long time, no changes.  Past medical history, Surgical history, Family history not pertinant except as noted below, Social history, Allergies, and medications have been entered into the medical record, reviewed, and no changes needed.   Review of Systems: No fevers, chills, night sweats, weight loss, chest pain, or shortness of breath.   Objective:    General: Well Developed, well nourished, and in no acute distress.  Neuro: Alert and oriented x3, extra-ocular muscles intact, sensation grossly intact.  HEENT: Normocephalic, atraumatic, pupils equal round reactive to light, neck supple, no masses, no lymphadenopathy, thyroid nonpalpable.  Skin: Warm and dry, no rashes. There is a small left shoulder skin tag/acrochordon Cardiac: Regular rate and rhythm, no murmurs rubs or gallops, no lower extremity edema.  Respiratory: Clear to auscultation bilaterally. Not using accessory muscles, speaking in full sentences.  Procedure:  Removal of left shoulder skin tag(s). Risks, benefits, alternatives explained to patient. Consent obtained. Time out conducted. Noted no overlying induration or erythema at site of injection. A small amount of lidocaine with epinephrine infiltrated under the skin tag(s) for local anesthesia. Hyfrecator used to both destroy skin tag and control minor bleeding. Antibiotic ointment applied. Wound dressed. Advised to return if increased redness, swelling, drainage, fevers, or chills.  Impression and Recommendations:

## 2014-03-05 ENCOUNTER — Encounter: Payer: Self-pay | Admitting: Physician Assistant

## 2014-03-05 ENCOUNTER — Ambulatory Visit (INDEPENDENT_AMBULATORY_CARE_PROVIDER_SITE_OTHER): Payer: 59 | Admitting: Physician Assistant

## 2014-03-05 VITALS — BP 104/62 | HR 72 | Ht 66.0 in | Wt 135.0 lb

## 2014-03-05 DIAGNOSIS — L708 Other acne: Secondary | ICD-10-CM

## 2014-03-05 DIAGNOSIS — J0100 Acute maxillary sinusitis, unspecified: Secondary | ICD-10-CM

## 2014-03-05 DIAGNOSIS — L700 Acne vulgaris: Secondary | ICD-10-CM

## 2014-03-05 DIAGNOSIS — R05 Cough: Secondary | ICD-10-CM

## 2014-03-05 DIAGNOSIS — R059 Cough, unspecified: Secondary | ICD-10-CM

## 2014-03-05 MED ORDER — DROSPIRENONE-ETHINYL ESTRADIOL 3-0.02 MG PO TABS: 1.0000 | ORAL_TABLET | Freq: Every day | ORAL | Status: DC

## 2014-03-05 MED ORDER — HYDROCODONE-HOMATROPINE 5-1.5 MG/5ML PO SYRP: 5.0000 mL | ORAL_SOLUTION | Freq: Every evening | ORAL | Status: DC | PRN

## 2014-03-05 MED ORDER — AZITHROMYCIN 250 MG PO TABS: ORAL_TABLET | ORAL | Status: DC

## 2014-03-05 NOTE — Progress Notes (Signed)
   Subjective:    Patient ID: Jennifer Abbott, female    DOB: Jun 16, 1981, 33 y.o.   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 She cannot sleep at night due to cough. She has a lot of drainage down the back of throat. Not tried anything to make better. No fever, chills, vomiting, ST. She does have some ear pressure and occasional nausea. She is very run down and tired.   She would also like something else for acne. She has had some many more skin lesions in the last couple of years. She notices worsening around her period. Her period are also very heavy and painful. She uses cetaphil acne wash, clindamycin gel, and epiduo. They help but acne persist.    Review of Systems  All other systems reviewed and are negative.      Objective:   Physical Exam  Constitutional: She is oriented to person, place, and time. She appears well-developed and well-nourished.  HENT:  Head: Normocephalic and atraumatic.    Right Ear: External ear normal.  Left Ear: External ear normal.  Nose: Nose normal.  TM"s with slight bulging of fluid behind.  Left sided maxillary tenderness to palpation.  Bilateral nasal turbinates red and swollen.  Oropharynx with PND.   Eyes: Conjunctivae are normal. Right eye exhibits no discharge. Left eye exhibits no discharge.  Neck: Normal range of motion. Neck supple.  Bilateral anterior cervical adenopathy, non-tender.   Cardiovascular: Normal rate, regular rhythm and normal heart sounds.   Pulmonary/Chest: Effort normal and breath sounds normal. She has no wheezes.  Neurological: She is alert and oriented to person, place, and time.  Psychiatric: She has a normal mood and affect. Her behavior is normal.          Assessment & Plan:  Acute sinusitis/cough- discussed with pt cough from drainage on back of throat. zpak given. Hycodan for cough. Consider mucinex. Follow up if not improving. Reassured pt that  lungs sound great.   Acne- continue clindamycin and epiduo with cetaphil was twice a day. Will add birth control. Jennifer Abbott was discussed and started. She had side effects with other OCP. Discussed can happen again. She is not sexually active so can start pill this Sunday. Discussed NOT smoking with OcP due to increased risk of blood clots. Follow up in 3 months. Discussed doxycycline but acne seems more hormonal than inflammatory.

## 2014-03-15 ENCOUNTER — Telehealth: Payer: Self-pay | Admitting: Family Medicine

## 2014-03-15 MED ORDER — PHENTERMINE HCL 15 MG PO CAPS
15.0000 mg | ORAL_CAPSULE | ORAL | Status: DC
Start: 2014-03-15 — End: 2014-04-23

## 2014-03-15 NOTE — Telephone Encounter (Addendum)
Pt requesting to take phentermine for short period of time to lose about 5 lbs.  She would like to try a low dose  She weighs 135 lbs and she is 5'6'' tall.  She doesn't have hx of BP or heart problems. 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  Ok to fill for one month. BP 98/62 today  Jennifer Lecher, MD

## 2014-04-23 ENCOUNTER — Ambulatory Visit (INDEPENDENT_AMBULATORY_CARE_PROVIDER_SITE_OTHER): Payer: 59 | Admitting: Sports Medicine

## 2014-04-23 VITALS — BP 100/70 | HR 70 | Ht 66.0 in | Wt 127.0 lb

## 2014-04-23 DIAGNOSIS — R635 Abnormal weight gain: Secondary | ICD-10-CM | POA: Insufficient documentation

## 2014-04-23 MED ORDER — PHENTERMINE HCL 37.5 MG PO TABS: ORAL_TABLET | ORAL | Status: DC

## 2014-04-23 NOTE — Assessment & Plan Note (Signed)
Patient has now been on phentermine, good weight loss. Refilling one half tab daily.

## 2014-04-23 NOTE — Progress Notes (Signed)
  Subjective:    CC: Weight check  HPI: This is a pleasant 33 year old female, she has been on phentermine for the past month and has lost a good 7 pounds. No side effects, currently doing 15 mg daily. Eager to continue.  Past medical history, Surgical history, Family history not pertinant except as noted below, Social history, Allergies, and medications have been entered into the medical record, reviewed, and no changes needed.   Review of Systems: No fevers, chills, night sweats, weight loss, chest pain, or shortness of breath.   Objective:    General: Well Developed, well nourished, and in no acute distress.  Neuro: Alert and oriented x3, extra-ocular muscles intact, sensation grossly intact.  HEENT: Normocephalic, atraumatic, pupils equal round reactive to light, neck supple, no masses, no lymphadenopathy, thyroid nonpalpable.  Skin: Warm and dry, no rashes. Cardiac: Regular rate and rhythm, no murmurs rubs or gallops, no lower extremity edema.  Respiratory: Clear to auscultation bilaterally. Not using accessory muscles, speaking in full sentences.  Impression and Recommendations:

## 2014-05-22 ENCOUNTER — Other Ambulatory Visit: Payer: Self-pay | Admitting: Physician Assistant

## 2014-05-22 MED ORDER — CIPROFLOXACIN HCL 500 MG PO TABS: 500.0000 mg | ORAL_TABLET | Freq: Two times a day (BID) | ORAL | Status: DC

## 2014-05-22 NOTE — Progress Notes (Signed)
Nurse visit. Positive for blood and leuks in dipstick. Treated with cipro for 3 days.

## 2014-06-03 ENCOUNTER — Other Ambulatory Visit: Payer: Self-pay | Admitting: Sports Medicine

## 2014-06-03 DIAGNOSIS — R635 Abnormal weight gain: Secondary | ICD-10-CM

## 2014-06-03 MED ORDER — PHENTERMINE HCL 37.5 MG PO TABS: ORAL_TABLET | ORAL | Status: DC

## 2014-06-03 NOTE — Assessment & Plan Note (Signed)
Good weight loss.  Refilling phentermine for another 4 months. We will track weight here at the office.

## 2014-07-17 ENCOUNTER — Other Ambulatory Visit: Payer: Self-pay | Admitting: Sports Medicine

## 2014-07-17 MED ORDER — TRAMADOL HCL 50 MG PO TABS: ORAL_TABLET | ORAL | Status: DC

## 2014-07-17 NOTE — Telephone Encounter (Signed)
Persistent, frequent, and intractable migraines despite NSAIDs, a sedimentation, ergo alkaloids. Adding tramadol for use as needed. Normal neurologic exam, no red flags.

## 2014-08-27 ENCOUNTER — Other Ambulatory Visit: Payer: Self-pay | Admitting: Physician Assistant

## 2014-08-27 MED ORDER — TERBINAFINE HCL 250 MG PO TABS: 250.0000 mg | ORAL_TABLET | Freq: Every day | ORAL | Status: AC

## 2014-08-28 ENCOUNTER — Other Ambulatory Visit: Payer: Self-pay | Admitting: Physician Assistant

## 2014-08-28 DIAGNOSIS — Z79899 Other long term (current) drug therapy: Secondary | ICD-10-CM

## 2014-08-28 NOTE — Progress Notes (Signed)
Needs liver enzymes checked before starting lamisil.

## 2014-08-29 LAB — COMPREHENSIVE METABOLIC PANEL
ALBUMIN: 4.4 g/dL (ref 3.5–5.2)
ALT: 8 U/L (ref 0–35)
AST: 11 U/L (ref 0–37)
Alkaline Phosphatase: 34 U/L — ABNORMAL LOW (ref 39–117)
BUN: 16 mg/dL (ref 6–23)
CHLORIDE: 102 meq/L (ref 96–112)
CO2: 26 meq/L (ref 19–32)
Calcium: 9.8 mg/dL (ref 8.4–10.5)
Creat: 0.71 mg/dL (ref 0.50–1.10)
Glucose, Bld: 87 mg/dL (ref 70–99)
POTASSIUM: 4.3 meq/L (ref 3.5–5.3)
SODIUM: 137 meq/L (ref 135–145)
TOTAL PROTEIN: 7.3 g/dL (ref 6.0–8.3)
Total Bilirubin: 0.3 mg/dL (ref 0.2–1.2)

## 2014-09-20 ENCOUNTER — Ambulatory Visit (INDEPENDENT_AMBULATORY_CARE_PROVIDER_SITE_OTHER): Payer: 59 | Admitting: Family Medicine

## 2014-09-20 VITALS — BP 108/71 | HR 106 | Wt 122.0 lb

## 2014-09-20 DIAGNOSIS — G43009 Migraine without aura, not intractable, without status migrainosus: Secondary | ICD-10-CM

## 2014-09-20 MED ORDER — PROMETHAZINE HCL 25 MG/ML IJ SOLN
25.0000 mg | Freq: Once | INTRAMUSCULAR | Status: AC
  Administered 2014-09-20: 25 mg via INTRAMUSCULAR

## 2014-09-20 MED ORDER — KETOROLAC TROMETHAMINE 60 MG/2ML IM SOLN
60.0000 mg | Freq: Once | INTRAMUSCULAR | Status: AC
  Administered 2014-09-20: 60 mg via INTRAMUSCULAR

## 2014-09-20 NOTE — Progress Notes (Signed)
   Subjective:    Patient ID: Jennifer Abbott, female    DOB: 1980/09/26, 34 y.o.   MRN: [id number]  HPI  Jennifer Abbott has had migraines daily for the last week. She reports light sensitivity and nausea. She did take Midol with some relief. In the past she was on maintanice medication, topiramate, and it caused her to have a change in taste and she didn't feel good while on medication.   Review of Systems     Objective:   Physical Exam        Assessment & Plan:  Migraine - Toradol 60 mg IM - Promethazine 25 mg IM - Patient advised to rest and follow up next week.   Jennifer Lecher, MD

## 2014-09-23 ENCOUNTER — Ambulatory Visit: Payer: 59 | Admitting: Physician Assistant

## 2014-09-30 ENCOUNTER — Ambulatory Visit (INDEPENDENT_AMBULATORY_CARE_PROVIDER_SITE_OTHER): Payer: 59 | Admitting: Physician Assistant

## 2014-09-30 ENCOUNTER — Encounter: Payer: Self-pay | Admitting: Physician Assistant

## 2014-09-30 VITALS — BP 99/64 | HR 84 | Ht 66.0 in | Wt 125.0 lb

## 2014-09-30 DIAGNOSIS — F329 Major depressive disorder, single episode, unspecified: Secondary | ICD-10-CM

## 2014-09-30 DIAGNOSIS — G43011 Migraine without aura, intractable, with status migrainosus: Secondary | ICD-10-CM

## 2014-09-30 DIAGNOSIS — F32A Depression, unspecified: Secondary | ICD-10-CM | POA: Insufficient documentation

## 2014-09-30 DIAGNOSIS — F39 Unspecified mood [affective] disorder: Secondary | ICD-10-CM | POA: Diagnosis not present

## 2014-09-30 DIAGNOSIS — R4586 Emotional lability: Secondary | ICD-10-CM | POA: Insufficient documentation

## 2014-09-30 MED ORDER — CITALOPRAM HYDROBROMIDE 10 MG PO TABS: 10.0000 mg | ORAL_TABLET | Freq: Every day | ORAL | Status: DC

## 2014-09-30 NOTE — Progress Notes (Signed)
   Subjective:    Patient ID: Jennifer Abbott, female    DOB: 24-Jan-1981, 34 y.o.   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   Headaches started about 6 months ago and usually right after lunch. That has resolved and now she will even wake up with headaches. Usually over right pariatal lobe but the worse one about 1 week ago was above the left eye. No vision changes or light sensitivity. She is nauseated and has used zofran to ease. She had another headache 3 days ago and took midol, execedrin migraine and finally duexis which did help. She had a dull headache this morning that resolved with ibuprofen. On average having 1-2 headaches a week.  In past tried topamax for migraine like headache. Changed taste to the point she had no desire to eat. She has not tried anything else. Never tried rescue.   Pt also admits that her stress and mood level are not controlled. She is a single mother, working and in school. She does not have a lot of support. She is very irritable and angry at times. She does find her self worrying. She has no hobbies and does not find a lot of pleasure in doing much of anything. Denies any sucidal or homicidal thoughts. Never tried anything for mood.      Review of Systems  All other systems reviewed and are negative.      Objective:   Physical Exam  Constitutional: She is oriented to person, place, and time. She appears well-developed and well-nourished.  HENT:  Head: Normocephalic and atraumatic.  Cardiovascular: Normal rate, regular rhythm and normal heart sounds.   Pulmonary/Chest: Effort normal and breath sounds normal.  Neurological: She is alert and oriented to person, place, and time.  Psychiatric: She has a normal mood and affect. Her behavior is normal.          Assessment & Plan:  depression/mood changes- GAD-7 was 5. PHQ-9 was 8. Pt admits scores change based on when she takes the  test. Will start celexa at bedtime. Side effects discussed. Follow up in 4-6 weeks.   Migraine intractable without aura and status migrainosus- discussed preventative options. Pt has not tolerated topamax in the past even at lowest dose. BP low and hesistate to put on propranolol. Discussed Elavil but pt would like to hold off at this time and consider stress/anxiety as a trigger and treat that first. Please complete HA diary to get a better picture of triggers. i did encourage pt to try a sample of relpax at onset of next migraine to see if giving any relief.   Spent 30 minutes with patient and greater than 50 percent of visit spent counseling pt regarding treatment plan.

## 2014-10-02 MED ORDER — ELETRIPTAN HYDROBROMIDE 20 MG PO TABS: 20.0000 mg | ORAL_TABLET | ORAL | Status: DC | PRN

## 2014-10-16 ENCOUNTER — Telehealth: Payer: Self-pay | Admitting: Physician Assistant

## 2014-10-16 NOTE — Telephone Encounter (Signed)
Confirmed wrong patient will re route.

## 2014-10-16 NOTE — Telephone Encounter (Signed)
Please call pt finally got assesment. Dx negative for ADHD rather GAD and MDD. This means that likely and stimulant would not be the solution to your problem and rather exacerbate it with another controlled substance.   We do however need to find better control in medication and behavioral techniques.

## 2014-10-16 NOTE — Telephone Encounter (Signed)
Incorrect patient

## 2014-11-19 ENCOUNTER — Other Ambulatory Visit: Payer: Self-pay | Admitting: Physician Assistant

## 2014-11-19 MED ORDER — CITALOPRAM HYDROBROMIDE 10 MG PO TABS: 10.0000 mg | ORAL_TABLET | Freq: Every day | ORAL | Status: DC

## 2014-11-19 NOTE — Telephone Encounter (Signed)
PHQ-9 score: 3 Gad-7 anxiety: 2  Patient scores are much improved, will refill for 6 months.

## 2014-12-03 ENCOUNTER — Other Ambulatory Visit: Payer: Self-pay

## 2014-12-03 DIAGNOSIS — R635 Abnormal weight gain: Secondary | ICD-10-CM

## 2014-12-03 MED ORDER — PHENTERMINE HCL 37.5 MG PO TABS: ORAL_TABLET | ORAL | Status: DC

## 2014-12-18 ENCOUNTER — Encounter: Payer: Self-pay | Admitting: Sports Medicine

## 2014-12-19 ENCOUNTER — Other Ambulatory Visit: Payer: Self-pay | Admitting: Sports Medicine

## 2014-12-19 ENCOUNTER — Other Ambulatory Visit: Payer: Self-pay | Admitting: Family Medicine

## 2014-12-19 DIAGNOSIS — R000 Tachycardia, unspecified: Secondary | ICD-10-CM

## 2014-12-19 DIAGNOSIS — R002 Palpitations: Secondary | ICD-10-CM

## 2014-12-19 DIAGNOSIS — D509 Iron deficiency anemia, unspecified: Secondary | ICD-10-CM

## 2014-12-19 LAB — CBC WITH DIFFERENTIAL/PLATELET
BASOS ABS: 0.1 10*3/uL (ref 0.0–0.1)
Basophils Relative: 1 % (ref 0–1)
Eosinophils Absolute: 0.1 10*3/uL (ref 0.0–0.7)
Eosinophils Relative: 2 % (ref 0–5)
HEMATOCRIT: 40.4 % (ref 36.0–46.0)
HEMOGLOBIN: 14 g/dL (ref 12.0–15.0)
LYMPHS ABS: 2 10*3/uL (ref 0.7–4.0)
LYMPHS PCT: 34 % (ref 12–46)
MCH: 31.1 pg (ref 26.0–34.0)
MCHC: 34.7 g/dL (ref 30.0–36.0)
MCV: 89.8 fL (ref 78.0–100.0)
MONO ABS: 0.6 10*3/uL (ref 0.1–1.0)
MONOS PCT: 10 % (ref 3–12)
MPV: 11 fL (ref 8.6–12.4)
NEUTROS PCT: 53 % (ref 43–77)
Neutro Abs: 3.1 10*3/uL (ref 1.7–7.7)
PLATELETS: 316 10*3/uL (ref 150–400)
RBC: 4.5 MIL/uL (ref 3.87–5.11)
RDW: 12.3 % (ref 11.5–15.5)
WBC: 5.9 10*3/uL (ref 4.0–10.5)

## 2014-12-19 LAB — IRON AND TIBC
%SAT: 35 % (ref 20–55)
Iron: 141 ug/dL (ref 42–145)
TIBC: 399 ug/dL (ref 250–470)
UIBC: 258 ug/dL (ref 125–400)

## 2014-12-19 LAB — TSH: TSH: 1.096 u[IU]/mL (ref 0.350–4.500)

## 2014-12-19 LAB — FERRITIN: FERRITIN: 44 ng/mL (ref 10–291)

## 2014-12-19 MED ORDER — FERROUS FUMARATE 325 (106 FE) MG PO TABS: 1.0000 | ORAL_TABLET | Freq: Two times a day (BID) | ORAL | Status: DC

## 2014-12-19 NOTE — Progress Notes (Signed)
Pat have tachycardia and palps x 2 days. Minimal caffeine intake. No other meds. Hx of iron de 2 yr ago.  Recheck per Dr. Madilyn Fireman, MD.

## 2014-12-25 ENCOUNTER — Other Ambulatory Visit: Payer: Self-pay | Admitting: Sports Medicine

## 2014-12-25 DIAGNOSIS — R002 Palpitations: Secondary | ICD-10-CM

## 2015-01-09 ENCOUNTER — Other Ambulatory Visit: Payer: Self-pay | Admitting: Physician Assistant

## 2015-01-09 MED ORDER — DROSPIRENONE-ETHINYL ESTRADIOL 3-0.02 MG PO TABS: 1.0000 | ORAL_TABLET | Freq: Every day | ORAL | Status: DC

## 2015-02-16 ENCOUNTER — Other Ambulatory Visit: Payer: Self-pay | Admitting: Sports Medicine

## 2015-02-21 ENCOUNTER — Encounter: Payer: Self-pay | Admitting: Family Medicine

## 2015-02-21 ENCOUNTER — Ambulatory Visit (INDEPENDENT_AMBULATORY_CARE_PROVIDER_SITE_OTHER): Payer: 59 | Admitting: Family Medicine

## 2015-02-21 VITALS — BP 108/70 | HR 92 | Ht 66.0 in | Wt 125.0 lb

## 2015-02-21 DIAGNOSIS — I493 Ventricular premature depolarization: Secondary | ICD-10-CM

## 2015-02-21 DIAGNOSIS — R002 Palpitations: Secondary | ICD-10-CM

## 2015-02-21 NOTE — Progress Notes (Signed)
Jennifer Abbott is a 34 y.o. female who presents to [facility]  today for occasions. Patient notes persistent bothersome palpitations present for years. She notes the palpitations are typically worse in the morning at work. She denies any chest pains or shortness of breath with palpitations. She notes a few episodes of syncope and her youth typically occurring when she stood up suddenly from a seated position. She has never had exertional syncope. She has been documented to have PVCs in the past. Her symptoms have been somewhat worse recently. She notes that she stopped taking phentermine about a week and a half ago which seemed to help reduce or palpitations.   No past medical history on file. Past Surgical History  Procedure Laterality Date  . Ovarirectomy       right for cyst  . Breast enhancement surgery Bilateral     2003   History  Substance Use Topics  . Smoking status: Former Research scientist (life sciences)  . Smokeless tobacco: Not on file  . Alcohol Use: Not on file   ROS as above Medications: Current Outpatient Prescriptions  Medication Sig Dispense Refill  . Biotin 800 MCG TABS Take 1,600 mcg by mouth.    . citalopram (CELEXA) 10 MG tablet Take 1 tablet (10 mg total) by mouth daily. 90 tablet 1  . drospirenone-ethinyl estradiol (YAZ,GIANVI,LORYNA) 3-0.02 MG tablet Take 1 tablet by mouth daily. 3 Package 4  . Multiple Vitamin (MULTIVITAMIN) tablet Take 1 tablet by mouth daily.    Marland Kitchen eletriptan (RELPAX) 20 MG tablet Take 1 tablet (20 mg total) by mouth as needed for migraine (Use at the first sign of migraine, may repeat x1 in 2h if needed.). One tablet by mouth at onset of headache. May repeat in 2 hours if headache persists or recurs. (Patient not taking: Reported on 02/21/2015) 2 tablet 0  . phentermine (ADIPEX-P) 37.5 MG tablet One half tab daily at around 11 AM (Patient not taking: Reported on 02/21/2015) 60 tablet 0   No current facility-administered medications  for this visit.   Allergies  Allergen Reactions  . Sulfa Antibiotics     Excessive yeast infection overload.  . Topamax [Topiramate]     Extreme taste changes.      Exam:  BP 108/70 mmHg  Pulse 92  Ht 5\' 6"  (1.676 m)  Wt 125 lb (56.7 kg)  BMI 20.19 kg/m2 Gen: Well NAD HEENT: EOMI,  MMM Lungs: Normal work of breathing. CTABL Heart: RRR no MRG Abd: NABS, Soft. Nondistended, Nontender Exts: Brisk capillary refill, warm and well perfused.   Twelve-lead EKG shows normal sinus rhythm with no ST segment elevation or depression. One PVC noted on EKG. Normal intervals.  No results found for this or any previous visit (from the past 24 hour(s)). No results found.   Please see individual assessment and plan sections.

## 2015-02-21 NOTE — Assessment & Plan Note (Signed)
Palpitations with PVCs. This is persistent and bothersome. Sent for Holter monitor to fully evaluate the cause of palpitations and frequency of PVC. May consider low dose beta blocker for symptomatically control as needed. Continue to abstain from phentermine.

## 2015-02-21 NOTE — Patient Instructions (Signed)
Thank you for coming in today. Lets get the holter.  Depending on the test we will start low dose beta blockers.  Call or go to the emergency room if you get worse, have trouble breathing, have chest pains, or palpitations.   Premature Ventricular Contraction Premature ventricular contraction (PVC) is an irregularity of the heart rhythm involving extra or skipped heartbeats. In some cases, they may occur without obvious cause or heart disease. Other times, they can be caused by an electrolyte change in the blood. These need to be corrected. They can also be seen when there is not enough oxygen going to the heart. A common cause of this is plaque or cholesterol buildup. This buildup decreases the blood supply to the heart. In addition, extra beats may be caused or aggravated by:  Excessive smoking.  Alcohol consumption.  Caffeine.  Certain medications  Some street drugs. SYMPTOMS   The sensation of feeling your heart skipping a beat (palpitations).  In many cases, the person may have no symptoms. SIGNS AND TESTS   A physical examination may show an occasional irregularity, but if the PVC beats do not happen often, they may not be found on physical exam.  Blood pressure is usually normal.  Other tests that may find extra beats of the heart are:  An EKG (electrocardiogram)  A Holter monitor which can monitor your heart over longer periods of time  An Angiogram (study of the heart arteries). TREATMENT  Usually extra heartbeats do not need treatment. The condition is treated only if symptoms are severe or if extra beats are very frequent or are causing problems. An underlying cause, if discovered, may also require treatment.  Treatment may also be needed if there may be a risk for other more serious cardiac arrhythmias.  PREVENTION   Moderation in caffeine, alcohol, and tobacco use may reduce the risk of ectopic heartbeats in some people.  Exercise often helps people who lead a  sedentary (inactive) lifestyle. PROGNOSIS  PVC heartbeats are generally harmless and do not need treatment.  RISKS AND COMPLICATIONS   Ventricular tachycardia (occasionally).  There usually are no complications.  Other arrhythmias (occasionally). SEEK IMMEDIATE MEDICAL CARE IF:   You feel palpitations that are frequent or continual.  You develop chest pain or other problems such as shortness of breath, sweating, or nausea and vomiting.  You become light-headed or faint (pass out).  You get worse or do not improve with treatment. Document Released: 02/27/2004 Document Revised: 10/04/2011 Document Reviewed: 09/08/2007 [facility] Patient Information 2015 [place], [place]. This information is not intended to replace advice given to you by your health care provider. Make sure you discuss any questions you have with your health care provider.

## 2015-02-27 NOTE — Addendum Note (Signed)
Addended by: Gregor Hams on: 02/27/2015 03:17 PM   Modules accepted: Orders

## 2015-02-27 NOTE — Addendum Note (Signed)
Addended by: Darla Lesches T on: 02/27/2015 03:21 PM   Modules accepted: Orders

## 2015-02-27 NOTE — Addendum Note (Signed)
Addended by: Huel Cote on: 02/27/2015 09:05 AM   Modules accepted: Orders

## 2015-03-03 DIAGNOSIS — R002 Palpitations: Secondary | ICD-10-CM | POA: Insufficient documentation

## 2015-03-04 ENCOUNTER — Ambulatory Visit (INDEPENDENT_AMBULATORY_CARE_PROVIDER_SITE_OTHER): Payer: 59

## 2015-03-04 DIAGNOSIS — I493 Ventricular premature depolarization: Secondary | ICD-10-CM | POA: Diagnosis not present

## 2015-03-04 DIAGNOSIS — R002 Palpitations: Secondary | ICD-10-CM

## 2015-03-06 ENCOUNTER — Telehealth: Payer: Self-pay | Admitting: Sports Medicine

## 2015-03-06 MED ORDER — AZITHROMYCIN 250 MG PO TABS: ORAL_TABLET | ORAL | Status: DC

## 2015-03-06 NOTE — Telephone Encounter (Signed)
Pleasant 34 year old female with increasing sinus pain, pressure, as well as malaise. Adding azithromycin for suspected acute maxillary sinusitis. She will do over-the-counter nasal decongestants.

## 2015-03-19 ENCOUNTER — Other Ambulatory Visit: Payer: Self-pay | Admitting: Physician Assistant

## 2015-03-19 MED ORDER — CITALOPRAM HYDROBROMIDE 10 MG PO TABS: 10.0000 mg | ORAL_TABLET | Freq: Every day | ORAL | Status: DC

## 2015-03-19 NOTE — Progress Notes (Signed)
Pt needs refill today. Sent 90 supply. PHQ-9 was 11. Told pt to increase to 20mg  daily for next couple of weeks and see if any improvement. If so will send over increased dose.

## 2015-03-25 ENCOUNTER — Telehealth: Payer: Self-pay | Admitting: Family Medicine

## 2015-03-25 NOTE — Telephone Encounter (Signed)
I reviewed the Holter monitor results with the patient. She expressed understanding. I offered beta blockers for symptom control and she stated that she will think about it.

## 2015-04-24 ENCOUNTER — Other Ambulatory Visit: Payer: Self-pay | Admitting: Sports Medicine

## 2015-04-24 ENCOUNTER — Other Ambulatory Visit: Payer: Self-pay | Admitting: Physician Assistant

## 2015-04-24 MED ORDER — PRENATAL VITAMIN PLUS LOW IRON 27-1 MG PO TABS: 1.0000 | ORAL_TABLET | Freq: Every day | ORAL | Status: DC

## 2015-04-24 MED ORDER — FLUCONAZOLE 150 MG PO TABS: ORAL_TABLET | ORAL | Status: DC

## 2015-05-12 ENCOUNTER — Other Ambulatory Visit: Payer: Self-pay | Admitting: Physician Assistant

## 2015-05-12 MED ORDER — CITALOPRAM HYDROBROMIDE 20 MG PO TABS: 20.0000 mg | ORAL_TABLET | Freq: Every day | ORAL | Status: DC

## 2015-05-15 ENCOUNTER — Telehealth: Payer: Self-pay | Admitting: Family Medicine

## 2015-05-15 NOTE — Telephone Encounter (Signed)
Chart updated

## 2015-05-28 ENCOUNTER — Other Ambulatory Visit: Payer: Self-pay | Admitting: Sports Medicine

## 2015-05-28 MED ORDER — PRENATAL VITAMIN PLUS LOW IRON 27-1 MG PO TABS: 1.0000 | ORAL_TABLET | Freq: Every day | ORAL | Status: DC

## 2015-06-26 ENCOUNTER — Ambulatory Visit (INDEPENDENT_AMBULATORY_CARE_PROVIDER_SITE_OTHER): Payer: 59 | Admitting: Family Medicine

## 2015-06-26 VITALS — BP 107/70 | HR 69

## 2015-06-26 DIAGNOSIS — G43011 Migraine without aura, intractable, with status migrainosus: Secondary | ICD-10-CM

## 2015-06-26 DIAGNOSIS — R519 Headache, unspecified: Secondary | ICD-10-CM

## 2015-06-26 DIAGNOSIS — R51 Headache: Secondary | ICD-10-CM

## 2015-06-26 MED ORDER — KETOROLAC TROMETHAMINE 60 MG/2ML IM SOLN
60.0000 mg | Freq: Once | INTRAMUSCULAR | Status: AC
  Administered 2015-06-26: 60 mg via INTRAMUSCULAR

## 2015-06-26 NOTE — Progress Notes (Signed)
   Subjective:    Patient ID: Jennifer Abbott, female    DOB: 1980/10/13, 35 y.o.   MRN: [id number]  HPI  Jennifer Abbott complains of headache since Monday. She also has not been sleeping well. She also reports some nausea for 1 day. Today her headache is worse to the point where she feels she may need to leave work early. Pain level is an 8/10 on the pain scale. She has a history of migraines and typically uses ibuprofen and sometimes Relpax.  Review of Systems     Objective:   Physical Exam  Constitutional: She is oriented to person, place, and time. She appears well-developed and well-nourished.  HENT:  Head: Normocephalic and atraumatic.  Eyes: Conjunctivae and EOM are normal.  Cardiovascular: Normal rate.   Pulmonary/Chest: Effort normal.  Neurological: She is alert and oriented to person, place, and time.  Skin: Skin is dry. No pallor.  Psychiatric: She has a normal mood and affect. Her behavior is normal.  Vitals reviewed.         Assessment & Plan:  Headache - tordal 60 mg given, per Dr Madilyn Fireman - Patient tolerated injection well without complications. Patient sent home from work to go home and rest and try to sleep today. Call if headache recurs. Okay to use Relpax up to 2 tabs in 24 hours if needed.  Jennifer Lecher, MD

## 2015-07-29 ENCOUNTER — Other Ambulatory Visit: Payer: Self-pay | Admitting: Physician Assistant

## 2015-07-29 MED ORDER — DROSPIRENONE-ETHINYL ESTRADIOL 3-0.02 MG PO TABS: 1.0000 | ORAL_TABLET | Freq: Every day | ORAL | Status: DC

## 2015-07-29 NOTE — Progress Notes (Signed)
Pt called and was told her OCP was not being made any more and needed to switch.

## 2015-07-31 ENCOUNTER — Other Ambulatory Visit: Payer: Self-pay | Admitting: Sports Medicine

## 2015-07-31 ENCOUNTER — Ambulatory Visit (INDEPENDENT_AMBULATORY_CARE_PROVIDER_SITE_OTHER): Payer: 59 | Admitting: Family Medicine

## 2015-07-31 VITALS — BP 114/70 | HR 101 | Temp 98.6°F | Ht 66.0 in | Wt 123.0 lb

## 2015-07-31 DIAGNOSIS — G43801 Other migraine, not intractable, with status migrainosus: Secondary | ICD-10-CM | POA: Diagnosis not present

## 2015-07-31 DIAGNOSIS — G43109 Migraine with aura, not intractable, without status migrainosus: Secondary | ICD-10-CM

## 2015-07-31 MED ORDER — RIZATRIPTAN BENZOATE 5 MG PO TBDP: 5.0000 mg | ORAL_TABLET | ORAL | Status: DC | PRN

## 2015-07-31 MED ORDER — DEXAMETHASONE SODIUM PHOSPHATE 4 MG/ML IJ SOLN
4.0000 mg | Freq: Once | INTRAMUSCULAR | Status: AC
  Administered 2015-07-31: 4 mg via INTRAMUSCULAR

## 2015-07-31 MED ORDER — ATENOLOL 25 MG PO TABS: 12.5000 mg | ORAL_TABLET | Freq: Every day | ORAL | Status: DC

## 2015-07-31 MED ORDER — KETOROLAC TROMETHAMINE 60 MG/2ML IM SOLN
60.0000 mg | Freq: Once | INTRAMUSCULAR | Status: AC
  Administered 2015-07-31: 60 mg via INTRAMUSCULAR

## 2015-07-31 NOTE — Progress Notes (Signed)
   Subjective:    Patient ID: [name], female    DOB: 1981/06/26, 35 y.o.   MRN: [id number]  HPI 35 year old female with a history of migraines who was seen today for persistent headache. It started Monday night, approximately 3 days ago. Most of her pain is focused on the top left side of her head. She rates it as severe.. Fact yesterday she had to leave work early to go home and rest. She did try taking some Advil. She does not have a prescription for a triptan and has not tried them in the past. Today her headache has persisted and she is requesting an injection of Toradol for pain relief. This normally does work well for her.  Review of Systems     Objective:   Physical Exam  Constitutional: She is oriented to person, place, and time. She appears well-developed and well-nourished.  HENT:  Head: Normocephalic and atraumatic.  Eyes: Conjunctivae and EOM are normal.  Cardiovascular: Normal rate.   Pulmonary/Chest: Effort normal.  Neurological: She is alert and oriented to person, place, and time.  Skin: Skin is dry. No pallor.  Psychiatric: She has a normal mood and affect. Her behavior is normal.  Vitals reviewed.         Assessment & Plan:  Migraine headache, intractable-given Toradol injection. After 2 hours she did not get any significant relief so given an injection of Depo-Medrol. She also really needs a prescription for a triptan to use at the onset of these headaches. Recommend follow-up with PCP to discuss prophylaxis as well as need for possible FMLA.

## 2015-07-31 NOTE — Telephone Encounter (Signed)
Persistent migraine headache, Toradol 60 and Decadron for given intramuscular, good improvement in symptoms however increase in palpitations, patient with known PACs and PVCs. Adding low-dose atenolol for migraine prevention and to block palpitations.

## 2015-07-31 NOTE — Assessment & Plan Note (Signed)
Persistent migraine headache, Toradol 60 and Decadron for given intramuscular, good (75%) improvement in symptoms however increase in palpitations, patient with known PACs and PVCs. Adding low-dose atenolol for migraine prevention and to block palpitations. She was intolerant to Topamax.

## 2015-07-31 NOTE — Addendum Note (Signed)
Addended by: Darla Lesches T on: 07/31/2015 01:39 PM   Modules accepted: Orders

## 2015-08-18 DIAGNOSIS — H5213 Myopia, bilateral: Secondary | ICD-10-CM | POA: Diagnosis not present

## 2015-08-18 DIAGNOSIS — H52223 Regular astigmatism, bilateral: Secondary | ICD-10-CM | POA: Diagnosis not present

## 2015-08-29 ENCOUNTER — Other Ambulatory Visit: Payer: Self-pay | Admitting: Physician Assistant

## 2015-08-29 MED ORDER — CITALOPRAM HYDROBROMIDE 20 MG PO TABS: 20.0000 mg | ORAL_TABLET | Freq: Every day | ORAL | Status: DC

## 2015-09-08 ENCOUNTER — Ambulatory Visit (INDEPENDENT_AMBULATORY_CARE_PROVIDER_SITE_OTHER): Payer: 59 | Admitting: Sports Medicine

## 2015-09-08 VITALS — BP 120/80 | HR 75 | Ht 66.0 in | Wt 127.0 lb

## 2015-09-08 DIAGNOSIS — R635 Abnormal weight gain: Secondary | ICD-10-CM | POA: Diagnosis not present

## 2015-09-08 MED ORDER — PHENTERMINE HCL 37.5 MG PO TABS: ORAL_TABLET | ORAL | Status: DC

## 2015-09-08 MED FILL — PHENTERMINE 37.5 MG TABLET: 37.5 | 30 days supply | Qty: 30 | Fill #0

## 2015-09-08 NOTE — Progress Notes (Signed)
  Subjective:    CC: Abnormal weight gain  HPI: This is a pleasant 36 year old female, she has struggled with adiposity for sometime now, she has tried dieting, exercise, unfortunately needs some help losing weight.  Past medical history, Surgical history, Family history not pertinant except as noted below, Social history, Allergies, and medications have been entered into the medical record, reviewed, and no changes needed.   Review of Systems: No fevers, chills, night sweats, weight loss, chest pain, or shortness of breath.   Objective:    General: Well Developed, well nourished, and in no acute distress.  Neuro: Alert and oriented x3, extra-ocular muscles intact, sensation grossly intact.  HEENT: Normocephalic, atraumatic, pupils equal round reactive to light, neck supple, no masses, no lymphadenopathy, thyroid nonpalpable.  Skin: Warm and dry, no rashes. Cardiac: Regular rate and rhythm, no murmurs rubs or gallops, no lower extremity edema.  Respiratory: Clear to auscultation bilaterally. Not using accessory muscles, speaking in full sentences.  Impression and Recommendations:

## 2015-09-08 NOTE — Assessment & Plan Note (Signed)
Adding phentermine, patient will return monthly for weight checks and refills. She understands that she will stop this medication if there is any worsening of migraines or palpitations.

## 2015-10-09 MED FILL — PRENATAL VITAMIN PLUS LOW I: 27-1 | 90 days supply | Qty: 90 | Fill #1

## 2015-10-21 ENCOUNTER — Other Ambulatory Visit: Payer: Self-pay | Admitting: Physician Assistant

## 2015-10-21 MED ORDER — AMOXICILLIN 875 MG PO TABS: 875.0000 mg | ORAL_TABLET | Freq: Two times a day (BID) | ORAL | Status: DC

## 2015-10-21 NOTE — Progress Notes (Signed)
Pt having left sinus pressure and teeth pain. No fever, chills, ST, ear pain.

## 2015-10-24 ENCOUNTER — Other Ambulatory Visit: Payer: Self-pay | Admitting: Physician Assistant

## 2015-10-24 ENCOUNTER — Other Ambulatory Visit: Payer: 59

## 2015-10-24 ENCOUNTER — Ambulatory Visit (HOSPITAL_BASED_OUTPATIENT_CLINIC_OR_DEPARTMENT_OTHER)
Admission: RE | Admit: 2015-10-24 | Discharge: 2015-10-24 | Disposition: A | Discharge status: Home / Self Care | Payer: 59 | Source: Ambulatory Visit | Attending: Physician Assistant | Admitting: Physician Assistant

## 2015-10-24 DIAGNOSIS — R51 Headache: Principal | ICD-10-CM

## 2015-10-24 DIAGNOSIS — J320 Chronic maxillary sinusitis: Secondary | ICD-10-CM

## 2015-10-24 DIAGNOSIS — K047 Periapical abscess without sinus: Secondary | ICD-10-CM | POA: Diagnosis not present

## 2015-10-24 DIAGNOSIS — R519 Headache, unspecified: Secondary | ICD-10-CM

## 2015-10-24 DIAGNOSIS — R221 Localized swelling, mass and lump, neck: Secondary | ICD-10-CM | POA: Diagnosis not present

## 2015-10-24 DIAGNOSIS — R220 Localized swelling, mass and lump, head: Secondary | ICD-10-CM | POA: Insufficient documentation

## 2015-10-24 MED ORDER — IOPAMIDOL (ISOVUE-300) INJECTION 61%
80.0000 mL | Freq: Once | INTRAVENOUS | Status: AC | PRN
  Administered 2015-10-24: 80 mL via INTRAVENOUS

## 2015-10-24 MED ORDER — LEVOFLOXACIN 750 MG PO TABS: 750.0000 mg | ORAL_TABLET | Freq: Every day | ORAL | Status: DC

## 2015-10-24 NOTE — Progress Notes (Signed)
3 days of left sided facial pain/pressure and swelling. Started on amoxicillin 1 day ago with depo medrol 80mg  and toradol 60mg  with no improvement. Ordered CT scan.

## 2015-10-27 ENCOUNTER — Other Ambulatory Visit: Payer: Self-pay | Admitting: Physician Assistant

## 2015-10-27 DIAGNOSIS — K047 Periapical abscess without sinus: Secondary | ICD-10-CM | POA: Insufficient documentation

## 2015-10-27 DIAGNOSIS — J320 Chronic maxillary sinusitis: Secondary | ICD-10-CM

## 2015-10-27 NOTE — Progress Notes (Signed)
Referral sent to Dr. Jetta Lout.

## 2015-11-26 ENCOUNTER — Other Ambulatory Visit: Payer: Self-pay | Admitting: Sports Medicine

## 2015-11-26 MED ORDER — TRETINOIN 0.1 % EX CREA: TOPICAL_CREAM | Freq: Every day | CUTANEOUS | Status: DC

## 2015-11-27 ENCOUNTER — Telehealth: Payer: Self-pay | Admitting: Sports Medicine

## 2015-11-27 DIAGNOSIS — L858 Other specified epidermal thickening: Secondary | ICD-10-CM | POA: Insufficient documentation

## 2015-11-27 NOTE — Telephone Encounter (Addendum)
Discussed tretinoin denial at length with insurance reviewer, now approved.

## 2015-11-27 NOTE — Telephone Encounter (Signed)
Patient with complaints of papular rash on legs extending to mid thigh, arms.  Has failed topical steroids, antibiotics.  Likely keratosis pilaris.  PA required on Tretinoin.

## 2015-11-28 ENCOUNTER — Telehealth: Payer: Self-pay | Admitting: *Deleted

## 2015-11-28 NOTE — Telephone Encounter (Signed)
closed

## 2015-12-02 ENCOUNTER — Other Ambulatory Visit: Payer: Self-pay

## 2015-12-02 MED ORDER — RIZATRIPTAN BENZOATE 5 MG PO TBDP: 5.0000 mg | ORAL_TABLET | ORAL | Status: DC | PRN

## 2015-12-05 MED FILL — TRETINOIN 0.1% CREAM: 0.1 | 14 days supply | Qty: 20 | Fill #0

## 2015-12-08 ENCOUNTER — Other Ambulatory Visit: Payer: Self-pay | Admitting: Sports Medicine

## 2015-12-08 MED ORDER — MAGNESIUM OXIDE 400 MG PO TABS: 800.0000 mg | ORAL_TABLET | Freq: Every day | ORAL | Status: DC

## 2015-12-16 ENCOUNTER — Other Ambulatory Visit: Payer: Self-pay | Admitting: Physician Assistant

## 2015-12-16 DIAGNOSIS — G43109 Migraine with aura, not intractable, without status migrainosus: Secondary | ICD-10-CM

## 2015-12-16 MED ORDER — ATENOLOL 25 MG PO TABS: 25.0000 mg | ORAL_TABLET | Freq: Every day | ORAL | Status: DC

## 2015-12-16 NOTE — Progress Notes (Signed)
Pt has been taking one tablet daily and helping more with migraine prevention and palpitations. Will send refill.

## 2015-12-19 ENCOUNTER — Ambulatory Visit (INDEPENDENT_AMBULATORY_CARE_PROVIDER_SITE_OTHER): Payer: 59 | Admitting: Sports Medicine

## 2015-12-19 VITALS — BP 111/72 | HR 96 | Resp 16 | Wt 130.0 lb

## 2015-12-19 DIAGNOSIS — R635 Abnormal weight gain: Secondary | ICD-10-CM

## 2015-12-19 MED ORDER — PHENTERMINE HCL 37.5 MG PO TABS: ORAL_TABLET | ORAL | Status: DC

## 2015-12-19 MED FILL — PHENTERMINE 37.5 MG TABLET: 37.5 | 30 days supply | Qty: 30 | Fill #0

## 2015-12-19 NOTE — Progress Notes (Signed)
  Subjective:    CC: Abnormal weight gain  HPI: This is a pleasant 35 year old female, she needs a refill on her phentermine.  Past medical history, Surgical history, Family history not pertinant except as noted below, Social history, Allergies, and medications have been entered into the medical record, reviewed, and no changes needed.   Review of Systems: No fevers, chills, night sweats, weight loss, chest pain, or shortness of breath.   Objective:    General: Well Developed, well nourished, and in no acute distress.  Neuro: Alert and oriented x3, extra-ocular muscles intact, sensation grossly intact.  HEENT: Normocephalic, atraumatic, pupils equal round reactive to light, neck supple, no masses, no lymphadenopathy, thyroid nonpalpable.  Skin: Warm and dry, no rashes. Cardiac: Regular rate and rhythm, no murmurs rubs or gallops, no lower extremity edema.  Respiratory: Clear to auscultation bilaterally. Not using accessory muscles, speaking in full sentences.  Impression and Recommendations:    I spent 25 minutes with this patient, greater than 50% was face-to-face time counseling regarding the above diagnoses

## 2015-12-19 NOTE — Assessment & Plan Note (Signed)
Refilling phentermine.

## 2015-12-26 ENCOUNTER — Other Ambulatory Visit: Payer: Self-pay | Admitting: *Deleted

## 2015-12-26 MED ORDER — DROSPIRENONE-ETHINYL ESTRADIOL 3-0.02 MG PO TABS: 1.0000 | ORAL_TABLET | Freq: Every day | ORAL | Status: DC

## 2016-01-16 ENCOUNTER — Encounter: Payer: Self-pay | Admitting: Physician Assistant

## 2016-01-16 ENCOUNTER — Ambulatory Visit (INDEPENDENT_AMBULATORY_CARE_PROVIDER_SITE_OTHER): Payer: 59 | Admitting: Physician Assistant

## 2016-01-16 VITALS — BP 95/64 | HR 71 | Resp 16 | Wt 125.0 lb

## 2016-01-16 DIAGNOSIS — R519 Headache, unspecified: Secondary | ICD-10-CM

## 2016-01-16 DIAGNOSIS — R51 Headache: Secondary | ICD-10-CM | POA: Diagnosis not present

## 2016-01-16 MED ORDER — KETOROLAC TROMETHAMINE 60 MG/2ML IM SOLN
60.0000 mg | Freq: Once | INTRAMUSCULAR | Status: AC
  Administered 2016-01-16: 60 mg via INTRAMUSCULAR

## 2016-01-30 ENCOUNTER — Ambulatory Visit (INDEPENDENT_AMBULATORY_CARE_PROVIDER_SITE_OTHER): Payer: 59 | Admitting: Physician Assistant

## 2016-01-30 VITALS — BP 111/72 | HR 96 | Wt 130.0 lb

## 2016-01-30 DIAGNOSIS — G43109 Migraine with aura, not intractable, without status migrainosus: Secondary | ICD-10-CM

## 2016-01-30 MED ORDER — DEXAMETHASONE SODIUM PHOSPHATE 4 MG/ML IJ SOLN
4.0000 mg | Freq: Once | INTRAMUSCULAR | Status: AC
  Administered 2016-01-30: 4 mg via INTRAMUSCULAR

## 2016-01-30 MED ORDER — KETOROLAC TROMETHAMINE 60 MG/2ML IM SOLN
60.0000 mg | Freq: Once | INTRAMUSCULAR | Status: AC
  Administered 2016-01-30: 60 mg via INTRAMUSCULAR

## 2016-01-30 NOTE — Progress Notes (Signed)
   Subjective:    Patient ID: Jennifer Abbott, female    DOB: 08-Nov-1980, 35 y.o.   MRN: [id number]  HPI   Patient is here with a headache that has progressively gotten worse as the day has gone by. Review of Systems     Objective:   Physical Exam        Assessment & Plan:  Injections given without complication

## 2016-02-02 ENCOUNTER — Ambulatory Visit: Payer: 59

## 2016-02-18 ENCOUNTER — Other Ambulatory Visit: Payer: Self-pay | Admitting: Physician Assistant

## 2016-02-18 ENCOUNTER — Telehealth: Payer: Self-pay | Admitting: Sports Medicine

## 2016-02-18 ENCOUNTER — Other Ambulatory Visit: Payer: Self-pay | Admitting: Sports Medicine

## 2016-02-18 DIAGNOSIS — G43011 Migraine without aura, intractable, with status migrainosus: Secondary | ICD-10-CM

## 2016-02-18 MED ORDER — FLUCONAZOLE 150 MG PO TABS: 150.0000 mg | ORAL_TABLET | Freq: Once | ORAL | 0 refills | Status: AC

## 2016-02-18 MED ORDER — PANTOPRAZOLE SODIUM 40 MG PO TBEC: 40.0000 mg | DELAYED_RELEASE_TABLET | Freq: Every day | ORAL | 3 refills | Status: DC

## 2016-02-18 MED ORDER — FLUCONAZOLE 150 MG PO TABS: 150.0000 mg | ORAL_TABLET | Freq: Once | ORAL | 0 refills | Status: DC

## 2016-02-18 MED ORDER — RIZATRIPTAN BENZOATE 5 MG PO TBDP
5.0000 mg | ORAL_TABLET | ORAL | 11 refills | Status: DC | PRN
Start: 2016-02-18 — End: 2017-04-18

## 2016-02-18 MED FILL — RIZATRIPTAN 5 MG ODT: 5 | 30 days supply | Qty: 9 | Fill #0

## 2016-02-18 NOTE — Assessment & Plan Note (Signed)
Refilling Maxalt.

## 2016-02-18 NOTE — Telephone Encounter (Signed)
Refilling Maxalt.

## 2016-02-26 ENCOUNTER — Other Ambulatory Visit: Payer: Self-pay | Admitting: Sports Medicine

## 2016-02-26 DIAGNOSIS — G43109 Migraine with aura, not intractable, without status migrainosus: Secondary | ICD-10-CM

## 2016-02-26 MED ORDER — ATENOLOL 50 MG PO TABS: 25.0000 mg | ORAL_TABLET | Freq: Every day | ORAL | 3 refills | Status: DC

## 2016-03-19 MED FILL — predniSONE 50 MG TABS: 50 | 5 days supply | Qty: 5 | Fill #0

## 2016-03-26 ENCOUNTER — Ambulatory Visit (INDEPENDENT_AMBULATORY_CARE_PROVIDER_SITE_OTHER): Payer: 59 | Admitting: Physician Assistant

## 2016-03-26 ENCOUNTER — Encounter: Payer: Self-pay | Admitting: Physician Assistant

## 2016-03-26 VITALS — BP 94/53 | HR 66 | Wt 127.0 lb

## 2016-03-26 DIAGNOSIS — D235 Other benign neoplasm of skin of trunk: Secondary | ICD-10-CM | POA: Diagnosis not present

## 2016-03-26 DIAGNOSIS — D225 Melanocytic nevi of trunk: Secondary | ICD-10-CM

## 2016-03-26 NOTE — Progress Notes (Signed)
   Subjective:    Patient ID: Jennifer Abbott, female    DOB: [birth year], 35 y.o.   MRN: [id number]  HPI Pt has a irritated mole in right axilla that she would like removed today. She cuts it shaving and it rubs her bra under shirt.    Review of Systems See HPI.     Objective:   Physical Exam  Constitutional: She appears well-developed and well-nourished.  HENT:  Head: Normocephalic and atraumatic.  Cardiovascular: Normal rate, regular rhythm and normal heart sounds.   Skin:  Right axilla irritated nevus 44mm by 2 mm.   Psychiatric: She has a normal mood and affect. Her behavior is normal.          Assessment & Plan:  Irritated nevus of axilla- shave off today.  Shave Biopsy Procedure Note  Pre-operative Diagnosis: irritated nevus   Post-operative Diagnosis: same   Locations right axilla  Indications: irritation/bleeding  Anesthesia: Lidocaine 1% without epinephrine without added sodium bicarbonate  Procedure Details  History of allergy to iodine: no  Patient informed of the risks (including bleeding and infection) and benefits of the  procedure and Verbal informed consent obtained.  The lesion and surrounding area were given a sterile prep using chlorhexidine and draped in the usual sterile fashion. A scalpel was used to shave an area of skin approximately 45mm by 20mm.  Hemostasis achieved with alumuninum chloride. Antibiotic ointment and a sterile dressing applied.  The specimen was not sent for pathologic examination. The patient tolerated the procedure well.  EBL: scant ml  Condition: Stable  Complications: none.  Plan: 1. Instructed to keep the wound dry and covered for 24-48h and clean thereafter. 2. Warning signs of infection were reviewed.   3. Recommended that the patient use OTC acetaminophen as needed for pain.

## 2016-03-30 MED FILL — PRENATAL VITAMIN PLUS LOW I: 27-1 | 90 days supply | Qty: 90 | Fill #2

## 2016-03-31 ENCOUNTER — Other Ambulatory Visit: Payer: Self-pay | Admitting: Physician Assistant

## 2016-03-31 MED ORDER — ATENOLOL 25 MG PO TABS: 25.0000 mg | ORAL_TABLET | Freq: Every day | ORAL | 3 refills | Status: DC

## 2016-03-31 MED ORDER — CITALOPRAM HYDROBROMIDE 20 MG PO TABS: 20.0000 mg | ORAL_TABLET | Freq: Every day | ORAL | 1 refills | Status: DC

## 2016-03-31 MED FILL — CITALOPRAM HBR 20 MG TABLET: 20 | 90 days supply | Qty: 90 | Fill #0

## 2016-03-31 MED FILL — ATENOLOL 25 MG TABLET: 25 | 90 days supply | Qty: 90 | Fill #0

## 2016-04-16 ENCOUNTER — Encounter: Payer: Self-pay | Admitting: Physician Assistant

## 2016-04-16 ENCOUNTER — Ambulatory Visit (INDEPENDENT_AMBULATORY_CARE_PROVIDER_SITE_OTHER): Payer: 59 | Admitting: Physician Assistant

## 2016-04-16 VITALS — BP 106/70 | HR 76 | Wt 127.0 lb

## 2016-04-16 DIAGNOSIS — G479 Sleep disorder, unspecified: Secondary | ICD-10-CM | POA: Insufficient documentation

## 2016-04-16 DIAGNOSIS — Z0000 Encounter for general adult medical examination without abnormal findings: Secondary | ICD-10-CM | POA: Diagnosis not present

## 2016-04-16 LAB — LIPID PANEL
CHOL/HDL RATIO: 2.1 ratio (ref <=5.0)
CHOLESTEROL: 213 mg/dL — AB (ref 125–200)
HDL: 100 mg/dL (ref >=46)
LDL Cholesterol: 94 mg/dL (ref <130)
TRIGLYCERIDES: 97 mg/dL (ref <150)
VLDL: 19 mg/dL (ref <30)

## 2016-04-16 LAB — COMPREHENSIVE METABOLIC PANEL WITH GFR
ALT: 11 U/L (ref 6–29)
AST: 15 U/L (ref 10–30)
Albumin: 4.4 g/dL (ref 3.6–5.1)
Alkaline Phosphatase: 31 U/L — ABNORMAL LOW (ref 33–115)
BUN: 17 mg/dL (ref 7–25)
CO2: 22 mmol/L (ref 20–31)
Calcium: 9.6 mg/dL (ref 8.6–10.2)
Chloride: 105 mmol/L (ref 98–110)
Creat: 0.82 mg/dL (ref 0.50–1.10)
Glucose, Bld: 99 mg/dL (ref 65–99)
Potassium: 4.6 mmol/L (ref 3.5–5.3)
Sodium: 137 mmol/L (ref 135–146)
Total Bilirubin: 0.3 mg/dL (ref 0.2–1.2)
Total Protein: 7.3 g/dL (ref 6.1–8.1)

## 2016-04-16 LAB — TSH: TSH: 1.33 mIU/L

## 2016-04-16 LAB — VITAMIN B12: Vitamin B-12: 397 pg/mL (ref 200–1100)

## 2016-04-16 MED ORDER — SUVOREXANT 10 MG PO TABS: 1.0000 | ORAL_TABLET | Freq: Every day | ORAL | 0 refills | Status: DC

## 2016-04-16 MED FILL — BELSOMRA 10 MG TABLET: 10 | 10 days supply | Qty: 10 | Fill #0

## 2016-04-16 NOTE — Progress Notes (Signed)
Subjective:    Patient ID: Jennifer Abbott, female    DOB: 1980/11/26, 35 y.o.   MRN: [id number]  HPI Pt is a 35 yo female who presents to the clinic for CPE. She is having some trouble staying asleep. She is able to go to sleep but not stay asleep. She continues to take celexa for mood and feels like it is helping.   .. Active Ambulatory Problems    Diagnosis Date Noted  . Migraine equivalent 12/07/2012  . PVC (premature ventricular contraction) 05/28/2013  . IDA (iron deficiency anemia) 06/18/2013  . Mood changes ([place]) 09/30/2014  . Depression 09/30/2014  . Intractable migraine without aura and with status migrainosus 09/30/2014  . Palpitations 03/03/2015  . Abnormal weight gain 09/08/2015  . Dental infection 10/24/2015  . Left maxillary sinusitis 10/24/2015  . Infection of tooth 10/27/2015  . Keratosis pilaris 11/27/2015  . Sleeping difficulties 04/16/2016   Resolved Ambulatory Problems    Diagnosis Date Noted  . Fracture of metatarsal of left foot, closed 05/28/2013  . Paronychia 11/22/2013  . Cystitis 12/20/2013  . Vaginitis and vulvovaginitis 01/23/2014  . Foreign body in skin of finger 02/13/2014  . Skin tag 02/14/2014  . Abnormal weight gain 04/23/2014   No Additional Past Medical History   .Marland Kitchen Family History  Problem Relation Age of Onset  . Hyperlipidemia Father   . Hypertension Father   . Heart attack Father   . Diabetes Maternal Grandfather   . Heart attack Paternal Grandfather    .Marland Kitchen Social History   Social History  . Marital status: Single    Spouse name: N/A  . Number of children: N/A  . Years of education: N/A   Occupational History  . Not on file.   Social History Main Topics  . Smoking status: Former Research scientist (life sciences)  . Smokeless tobacco: Not on file  . Alcohol use Not on file  . Drug use: Unknown  . Sexual activity: No   Other Topics Concern  . Not on file   Social History Narrative  . No narrative on file      Review of Systems  All  other systems reviewed and are negative.      Objective:   Physical Exam  Constitutional: She is oriented to person, place, and time. She appears well-developed and well-nourished.  HENT:  Head: Normocephalic and atraumatic.  Right Ear: External ear normal.  Left Ear: External ear normal.  Nose: Nose normal.  Mouth/Throat: Oropharynx is clear and moist. No oropharyngeal exudate.  Eyes: Conjunctivae and EOM are normal. Pupils are equal, round, and reactive to light. Right eye exhibits no discharge. Left eye exhibits no discharge.  Neck: Normal range of motion. Neck supple. No thyromegaly present.  Cardiovascular: Normal rate, regular rhythm and normal heart sounds.   Pulmonary/Chest: Effort normal and breath sounds normal. She has no wheezes.  Abdominal: Soft. Bowel sounds are normal. She exhibits no distension and no mass. There is no tenderness. There is no rebound and no guarding.  Musculoskeletal: Normal range of motion.  Lymphadenopathy:    She has no cervical adenopathy.  Neurological: She is alert and oriented to person, place, and time.  Skin: No rash noted. No erythema.  Left shoulder 57mm superficial abrasions.   Psychiatric: She has a normal mood and affect. Her behavior is normal.          Assessment & Plan:  CPE- discussed need for pap. Lipid, cmp, TSH, vitamin b12, D ordered. Encouraged 150 minutes of  exercise a week, vitamin D 800 units and calcium 1500mg  or 4 servings of dairy daily.   Abrasion- reassurance given. Encouraged to keep clean. Follow up as needed.   Trouble sleeping- I think pt would be a good candidate for belsomnra since trouble staying asleep. Coupon card given. Follow up in 1-2 months. Will start with 10mg  dose.

## 2016-04-16 NOTE — Patient Instructions (Signed)

## 2016-04-17 LAB — VITAMIN D 25 HYDROXY (VIT D DEFICIENCY, FRACTURES): Vit D, 25-Hydroxy: 40 ng/mL (ref 30–100)

## 2016-04-21 ENCOUNTER — Other Ambulatory Visit: Payer: Self-pay | Admitting: Sports Medicine

## 2016-04-21 DIAGNOSIS — R635 Abnormal weight gain: Secondary | ICD-10-CM

## 2016-04-21 MED ORDER — SUVOREXANT 10 MG PO TABS: 1.0000 | ORAL_TABLET | Freq: Every day | ORAL | 3 refills | Status: DC

## 2016-04-21 MED ORDER — PHENTERMINE HCL 37.5 MG PO TABS: ORAL_TABLET | ORAL | 0 refills | Status: DC

## 2016-04-21 MED FILL — PHENTERMINE 37.5 MG TABLET: 37.5 | 30 days supply | Qty: 30 | Fill #0

## 2016-05-14 MED FILL — RIZATRIPTAN 5 MG ODT: 5 | 30 days supply | Qty: 9 | Fill #1

## 2016-07-01 ENCOUNTER — Encounter: Payer: 59 | Admitting: Sports Medicine

## 2016-07-09 ENCOUNTER — Other Ambulatory Visit: Payer: Self-pay | Admitting: Physician Assistant

## 2016-07-09 MED ORDER — CITALOPRAM HYDROBROMIDE 40 MG PO TABS: 40.0000 mg | ORAL_TABLET | Freq: Every day | ORAL | 0 refills | Status: DC

## 2016-07-09 MED FILL — RIZATRIPTAN 5 MG ODT: 5 | 30 days supply | Qty: 9 | Fill #2

## 2016-07-09 MED FILL — CITALOPRAM HBR 40 MG TABLET: 40 | 90 days supply | Qty: 90 | Fill #0

## 2016-07-09 NOTE — Progress Notes (Signed)
Pt needs refill of celexa. She increased to 40mg  for the last week. She has notice she is not crying as easily. She denies any suicidal or homicidal thoughts.  Iran Planas PAC

## 2016-07-13 MED FILL — ATENOLOL 25 MG TABLET: 25 | 90 days supply | Qty: 90 | Fill #1

## 2016-07-27 ENCOUNTER — Ambulatory Visit: Payer: 59 | Admitting: Physician Assistant

## 2016-08-05 ENCOUNTER — Ambulatory Visit (INDEPENDENT_AMBULATORY_CARE_PROVIDER_SITE_OTHER): Payer: 59 | Admitting: Family Medicine

## 2016-08-05 VITALS — BP 102/65 | HR 68 | Temp 99.0°F | Resp 18 | Wt 128.0 lb

## 2016-08-05 DIAGNOSIS — R21 Rash and other nonspecific skin eruption: Secondary | ICD-10-CM

## 2016-08-05 NOTE — Progress Notes (Addendum)
Subjective:    Patient ID: Jennifer Abbott, female    DOB: 1981-02-04, 36 y.o.   MRN: [id number]  HPI 37 year old female comes in today complaining of a rash. She said she had noticed a little spot on her left hip about a week ago. She was going to see her medical provider it actually went away before the appointment. It lasted about 2-3 days and then resolved on its own. Then today she noticed some red spots on her inner upper arms towards the axilla as well as a few on her abdomen. Nothing on her back or face her distal extremities. She says they are not itchy or bothersome. She has not changed any soaps, lotions or detergents etc.  No recent changes in medications. She's currently on atenolol and citalopram, neither one of these medications are new. She denies any recent illnesses fevers chills or sweats.   Review of Systems  BP 102/65   Pulse 68   Temp 99 F (37.2 C)   Resp 18   Wt 128 lb (58.1 kg)   BMI 20.66 kg/m     Allergies  Allergen Reactions  . Sulfa Antibiotics     Excessive yeast infection overload.  . Topamax [Topiramate]     Extreme taste changes.     No past medical history on file.  Past Surgical History:  Procedure Laterality Date  . BREAST ENHANCEMENT SURGERY Bilateral    2003  . ovarirectomy      right for cyst    Social History   Social History  . Marital status: Single    Spouse name: N/A  . Number of children: N/A  . Years of education: N/A   Occupational History  . Not on file.   Social History Main Topics  . Smoking status: Former Research scientist (life sciences)  . Smokeless tobacco: Not on file  . Alcohol use Not on file  . Drug use: Unknown  . Sexual activity: No   Other Topics Concern  . Not on file   Social History Narrative  . No narrative on file    Family History  Problem Relation Age of Onset  . Hyperlipidemia Father   . Hypertension Father   . Heart attack Father   . Diabetes Maternal Grandfather   . Heart attack Paternal Grandfather      Outpatient Encounter Prescriptions as of 08/05/2016  Medication Sig  . atenolol (TENORMIN) 25 MG tablet Take 1 tablet (25 mg total) by mouth daily.  . Biotin 800 MCG TABS Take 1,600 mcg by mouth.  . citalopram (CELEXA) 40 MG tablet Take 1 tablet (40 mg total) by mouth daily.  . drospirenone-ethinyl estradiol (YAZ,GIANVI,LORYNA) 3-0.02 MG tablet Take 1 tablet by mouth daily. Please fill Gianvi  . phentermine (ADIPEX-P) 37.5 MG tablet One tab by mouth qAM  . Prenatal Vit-Fe Fumarate-FA (PRENATAL VITAMIN PLUS LOW IRON) 27-1 MG TABS Take 1 tablet by mouth daily.  . rizatriptan (MAXALT-MLT) 5 MG disintegrating tablet Take 1 tablet (5 mg total) by mouth as needed for migraine. May repeat in 2 hours if needed  . Suvorexant (BELSOMRA) 10 MG TABS Take 1 tablet by mouth at bedtime.  . tretinoin (RETIN-A) 0.1 % cream Apply topically at bedtime.   No facility-administered encounter medications on file as of 08/05/2016.           Objective:   Physical Exam  Constitutional: She is oriented to person, place, and time. She appears well-developed and well-nourished.  HENT:  Head: Normocephalic and atraumatic.  Eyes: Conjunctivae and EOM are normal.  Cardiovascular: Normal rate.   Pulmonary/Chest: Effort normal.  Neurological: She is alert and oriented to person, place, and time.  Skin: Skin is dry. No pallor.  Psychiatric: She has a normal mood and affect. Her behavior is normal.  Vitals reviewed.  Vitals:   08/05/16 1100  Weight: 128 lb (58.1 kg)   Vitals:   08/05/16 1100  BP: 102/65  Pulse: 68  Resp: 18  Temp: 99 F (37.2 C)               Assessment & Plan:  Rash, nonvesicular-unclear etiology at this point. Possible contact irritant versus nummular eczema versus granuloma annulare .  I really don't think that this is a drug reaction. The get a CBC with differential just to see if there is any type of allergic component that could be going on. There is no itching. And most  the lesions are macular Abbott not consistent with hives. We'll continue to monitor for fever or new symptoms or spreading of the rash. Can consider skin KOH for fungal evaluation. Thyroid levels were recently checked and normal.  Lab Results  Component Value Date   TSH 1.33 04/16/2016

## 2016-08-06 LAB — CBC WITH DIFFERENTIAL/PLATELET
Basophils Absolute: 121 cells/uL (ref 0–200)
Basophils Relative: 1 %
Eosinophils Absolute: 484 cells/uL (ref 15–500)
Eosinophils Relative: 4 %
HEMATOCRIT: 36.9 % (ref 35.0–45.0)
Hemoglobin: 12.5 g/dL (ref 11.7–15.5)
LYMPHS PCT: 21 %
Lymphs Abs: 2541 cells/uL (ref 850–3900)
MCH: 30.9 pg (ref 27.0–33.0)
MCHC: 33.9 g/dL (ref 32.0–36.0)
MCV: 91.3 fL (ref 80.0–100.0)
MONO ABS: 1089 {cells}/uL — AB (ref 200–950)
MONOS PCT: 9 %
MPV: 10.5 fL (ref 7.5–12.5)
Neutro Abs: 7865 cells/uL — ABNORMAL HIGH (ref 1500–7800)
Neutrophils Relative %: 65 %
PLATELETS: 369 10*3/uL (ref 140–400)
RBC: 4.04 MIL/uL (ref 3.80–5.10)
RDW: 12.2 % (ref 11.0–15.0)
WBC: 12.1 10*3/uL — AB (ref 3.8–10.8)

## 2016-08-27 ENCOUNTER — Other Ambulatory Visit: Payer: Self-pay | Admitting: Sports Medicine

## 2016-08-27 DIAGNOSIS — R635 Abnormal weight gain: Secondary | ICD-10-CM

## 2016-08-27 MED ORDER — PHENTERMINE HCL 37.5 MG PO TABS: ORAL_TABLET | ORAL | 0 refills | Status: DC

## 2016-08-27 MED FILL — PHENTERMINE 37.5 MG TABLET: 37.5 | 30 days supply | Qty: 30 | Fill #0

## 2016-08-30 MED FILL — RIZATRIPTAN 5 MG ODT: 5 | 30 days supply | Qty: 9 | Fill #3

## 2016-10-29 MED FILL — ATENOLOL 25 MG TABLET: 25 | 90 days supply | Qty: 90 | Fill #2

## 2016-11-08 ENCOUNTER — Ambulatory Visit (INDEPENDENT_AMBULATORY_CARE_PROVIDER_SITE_OTHER): Payer: 59 | Admitting: Sports Medicine

## 2016-11-08 DIAGNOSIS — F32A Depression, unspecified: Secondary | ICD-10-CM

## 2016-11-08 DIAGNOSIS — G43011 Migraine without aura, intractable, with status migrainosus: Secondary | ICD-10-CM

## 2016-11-08 DIAGNOSIS — I493 Ventricular premature depolarization: Secondary | ICD-10-CM

## 2016-11-08 DIAGNOSIS — R635 Abnormal weight gain: Secondary | ICD-10-CM

## 2016-11-08 DIAGNOSIS — F329 Major depressive disorder, single episode, unspecified: Secondary | ICD-10-CM

## 2016-11-08 MED ORDER — PRENATAL VITAMIN PLUS LOW IRON 27-1 MG PO TABS: 1.0000 | ORAL_TABLET | Freq: Every day | ORAL | 3 refills | Status: DC

## 2016-11-08 MED ORDER — CITALOPRAM HYDROBROMIDE 40 MG PO TABS: 20.0000 mg | ORAL_TABLET | Freq: Every day | ORAL | 0 refills | Status: DC

## 2016-11-08 MED ORDER — ATENOLOL 25 MG PO TABS: 37.5000 mg | ORAL_TABLET | Freq: Every day | ORAL | 3 refills | Status: DC

## 2016-11-08 NOTE — Progress Notes (Signed)
  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 More recently started phentermine, having increasing palpitations with mild headaches. No chest pain, shortness of breath, no presyncope.  Past medical history:  Negative.  See flowsheet/record as well for more information.  Surgical history: Negative.  See flowsheet/record as well for more information.  Family history: Negative.  See flowsheet/record as well for more information.  Social history: Negative.  See flowsheet/record as well for more information.  Allergies, and medications have been entered into the medical record, reviewed, and no changes needed.   Review of Systems: No fevers, chills, night sweats, weight loss, chest pain, or shortness of breath.   Objective:    General: Well Developed, well nourished, and in no acute distress.  Neuro: Alert and oriented x3, extra-ocular muscles intact, sensation grossly intact.  HEENT: Normocephalic, atraumatic, pupils equal round reactive to light, neck supple, no masses, no lymphadenopathy, thyroid nonpalpable.  Skin: Warm and dry, no rashes. Cardiac: Regular rate and rhythm, for the most part with occasional pauses, no murmurs rubs or gallops, no lower extremity edema.  Respiratory: Clear to auscultation bilaterally. Not using accessory muscles, speaking in full sentences.  12 ECG personally reviewed, rate of 74, no PR changes, no ST changes, good R-wave progression, normal axis. No PACs or PVCs.  Impression and Recommendations:    PVC (premature ventricular contraction) Increasing palpitations, I we are going to cut her phentermine in half. Didn't take any today. In addition she should probably cut back on caffeine intake. Interestingly ECG was normal sinus rhythm, normal PR  interval, normal axis, no PACs or PVCs. Previous 48 hour Holter monitor did show PACs and PVCs rarely. With history of iron deficiency anemia tomorrow morning we will get a fingerstick hemoglobin, all the machines have been turned off today. I also think we need to bump her atenolol up to 1.5 tabs daily, this will be 37.5 mg. We will keep an eye out for orthostasis.  Depression Doing well on Celexa 20.

## 2016-11-08 NOTE — Assessment & Plan Note (Signed)
Doing well on Celexa 20.

## 2016-11-08 NOTE — Assessment & Plan Note (Addendum)
Increasing palpitations, I we are going to cut her phentermine in half. Didn't take any today. In addition she should probably cut back on caffeine intake. Interestingly ECG was normal sinus rhythm, normal PR interval, normal axis, no PACs or PVCs. Previous 48 hour Holter monitor did show PACs and PVCs rarely. With history of iron deficiency anemia tomorrow morning we will get a fingerstick hemoglobin, all the machines have been turned off today. I also think we need to bump her atenolol up to 1.5 tabs daily, this will be 37.5 mg. We will keep an eye out for orthostasis.

## 2016-11-11 NOTE — Addendum Note (Signed)
Addended by: Huel Cote on: 11/11/2016 10:01 AM   Modules accepted: Orders

## 2016-11-18 MED FILL — PRENATAL VITAMIN PLUS LOW I: 27-1 | 90 days supply | Qty: 90 | Fill #0

## 2016-11-18 MED FILL — CITALOPRAM HBR 40 MG TABLET: 40 | 90 days supply | Qty: 45 | Fill #0

## 2016-12-15 ENCOUNTER — Other Ambulatory Visit: Payer: Self-pay | Admitting: Physician Assistant

## 2016-12-15 DIAGNOSIS — R635 Abnormal weight gain: Secondary | ICD-10-CM

## 2016-12-15 MED ORDER — CICLOPIROX 8 % EX SOLN: Freq: Every day | CUTANEOUS | 0 refills | Status: DC

## 2016-12-15 MED ORDER — PHENTERMINE HCL 37.5 MG PO TABS: ORAL_TABLET | ORAL | 0 refills | Status: DC

## 2016-12-15 MED ORDER — TERBINAFINE HCL 250 MG PO TABS: 250.0000 mg | ORAL_TABLET | Freq: Every day | ORAL | 1 refills | Status: DC

## 2016-12-15 MED FILL — PHENTERMINE 37.5 MG TABLET: 37.5 | 60 days supply | Qty: 30 | Fill #0

## 2016-12-15 MED FILL — CICLOPIROX 8% SOLUTION: 8 | 30 days supply | Qty: 7 | Fill #0

## 2016-12-15 NOTE — Progress Notes (Signed)
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 She has not lost any weight she is 138 from 134 at last visit. She admits she has not taken every day.  Discussed with her she much lose weight to refill. 1/2 tablet recheck in 2 months if no weight loss need to discontinue.  lamisil given for toenail fungus. Liver enzymes were checked in 03/2016 and great. Treatment is for 12 weeks.

## 2016-12-28 ENCOUNTER — Telehealth: Payer: Self-pay | Admitting: Physician Assistant

## 2016-12-28 MED ORDER — DROSPIRENONE-ETHINYL ESTRADIOL 3-0.02 MG PO TABS: 1.0000 | ORAL_TABLET | Freq: Every day | ORAL | 4 refills | Status: DC

## 2016-12-28 MED FILL — GIANVI 3-0.02 MG TABS: 3-0.02 | 63 days supply | Qty: 84 | Fill #0

## 2016-12-28 NOTE — Telephone Encounter (Signed)
Pt discontinue OCP due to increase CVA/DVT/STROke risk associated with migraines with aura and OCP. Her periods and mood are terrible and wants to take the risk. I will send over OCP to restart.

## 2017-01-17 MED FILL — ATENOLOL 25 MG TABLET: 25 | 90 days supply | Qty: 90 | Fill #3

## 2017-01-30 ENCOUNTER — Other Ambulatory Visit: Payer: Self-pay | Admitting: Sports Medicine

## 2017-01-30 MED ORDER — SUMATRIPTAN SUCCINATE 25 MG PO TABS: 25.0000 mg | ORAL_TABLET | ORAL | 3 refills | Status: DC | PRN

## 2017-01-30 MED ORDER — ONDANSETRON 8 MG PO TBDP: 8.0000 mg | ORAL_TABLET | Freq: Three times a day (TID) | ORAL | 3 refills | Status: DC | PRN

## 2017-01-30 MED ORDER — PROMETHAZINE HCL 25 MG PO TABS: 25.0000 mg | ORAL_TABLET | Freq: Four times a day (QID) | ORAL | 3 refills | Status: DC | PRN

## 2017-02-14 ENCOUNTER — Ambulatory Visit (INDEPENDENT_AMBULATORY_CARE_PROVIDER_SITE_OTHER): Payer: 59

## 2017-02-14 ENCOUNTER — Telehealth: Payer: Self-pay | Admitting: Sports Medicine

## 2017-02-14 DIAGNOSIS — R109 Unspecified abdominal pain: Secondary | ICD-10-CM | POA: Insufficient documentation

## 2017-02-14 DIAGNOSIS — K56609 Unspecified intestinal obstruction, unspecified as to partial versus complete obstruction: Secondary | ICD-10-CM | POA: Diagnosis not present

## 2017-02-14 DIAGNOSIS — R140 Abdominal distension (gaseous): Secondary | ICD-10-CM | POA: Diagnosis not present

## 2017-02-14 DIAGNOSIS — G43011 Migraine without aura, intractable, with status migrainosus: Secondary | ICD-10-CM

## 2017-02-14 NOTE — Assessment & Plan Note (Addendum)
Several months of abdominal bloating, mild lower pain, diarrhea with bits of blood.  No dysuria, frequency, urgency.  No constitutional symptoms.  Frequent headaches.  Adding CBC, CMP, lipase, amylase, UA, UCx, abdominal XR.  Looking for organ dysfunction and signs of PSBO. C diff, stool Cx.  Ddx includes, IBS, diverticulitis, PSBO, hemorrhoids, infectious diarrhea.  Discussed with gastroenterology, slow trickle GI bleed, months, with rapid loss of blood with a hemoglobin drop from 14 down to 11, suspect infectious versus inflammatory colitis, need CT stat.

## 2017-02-14 NOTE — Telephone Encounter (Signed)
Several months of abdominal bloating, mild lower pain, diarrhea with bits of blood.  No dysuria, frequency, urgency.  No constitutional symptoms.  Frequent headaches.  Adding CBC, CMP, lipase, amylase, UA, UCx, abdominal XR.  Looking for organ dysfunction and signs of PSBO.

## 2017-02-15 LAB — URINALYSIS

## 2017-02-15 LAB — CBC WITH DIFFERENTIAL/PLATELET
Basophils Absolute: 0 {cells}/uL (ref 0–200)
Basophils Relative: 0 %
Eosinophils Absolute: 4599 cells/uL — ABNORMAL HIGH (ref 15–500)
Eosinophils Relative: 21 %
HCT: 37.9 % (ref 35.0–45.0)
Hemoglobin: 13 g/dL (ref 11.7–15.5)
Lymphocytes Relative: 11 %
Lymphs Abs: 2409 cells/uL (ref 850–3900)
MCH: 30.3 pg (ref 27.0–33.0)
MCHC: 34.3 g/dL (ref 32.0–36.0)
MCV: 88.3 fL (ref 80.0–100.0)
MPV: 10.3 fL (ref 7.5–12.5)
Monocytes Absolute: 1533 cells/uL — ABNORMAL HIGH (ref 200–950)
Monocytes Relative: 7 %
Neutro Abs: 13359 cells/uL — ABNORMAL HIGH (ref 1500–7800)
Neutrophils Relative %: 61 %
Platelets: 341 K/uL (ref 140–400)
RBC: 4.29 MIL/uL (ref 3.80–5.10)
RDW: 12.8 % (ref 11.0–15.0)
WBC: 21.9 10*3/uL — ABNORMAL HIGH (ref 3.8–10.8)

## 2017-02-15 LAB — COMPREHENSIVE METABOLIC PANEL
ALT: 10 U/L (ref 6–29)
AST: 11 U/L (ref 10–30)
Alkaline Phosphatase: 59 U/L (ref 33–115)
BUN: 9 mg/dL (ref 7–25)
Calcium: 8.7 mg/dL (ref 8.6–10.2)
Glucose, Bld: 74 mg/dL (ref 65–99)
Total Bilirubin: 0.2 mg/dL (ref 0.2–1.2)
Total Protein: 6.9 g/dL (ref 6.1–8.1)

## 2017-02-15 LAB — LIPASE: Lipase: <5 U/L — ABNORMAL LOW (ref 7–60)

## 2017-02-15 LAB — COMPREHENSIVE METABOLIC PANEL WITH GFR
Albumin: 3.7 g/dL (ref 3.6–5.1)
CO2: 27 mmol/L (ref 20–31)
Chloride: 101 mmol/L (ref 98–110)
Creat: 0.74 mg/dL (ref 0.50–1.10)
Potassium: 4.1 mmol/L (ref 3.5–5.3)
Sodium: 136 mmol/L (ref 135–146)

## 2017-02-15 LAB — AMYLASE: Amylase: 15 U/L — ABNORMAL LOW (ref 21–101)

## 2017-02-16 ENCOUNTER — Ambulatory Visit: Payer: 59 | Admitting: Family Medicine

## 2017-02-16 DIAGNOSIS — R109 Unspecified abdominal pain: Secondary | ICD-10-CM | POA: Diagnosis not present

## 2017-02-16 MED ORDER — AMOXICILLIN-POT CLAVULANATE 875-125 MG PO TABS: 1.0000 | ORAL_TABLET | Freq: Two times a day (BID) | ORAL | 0 refills | Status: DC

## 2017-02-16 MED ORDER — TOPIRAMATE ER 25 MG PO CAP24: 1.0000 | ORAL_CAPSULE | Freq: Every day | ORAL | 3 refills | Status: DC

## 2017-02-16 MED ORDER — CIPROFLOXACIN HCL 750 MG PO TABS: 750.0000 mg | ORAL_TABLET | Freq: Two times a day (BID) | ORAL | 0 refills | Status: DC

## 2017-02-16 MED ORDER — FLUCONAZOLE 150 MG PO TABS: 150.0000 mg | ORAL_TABLET | Freq: Once | ORAL | 0 refills | Status: AC

## 2017-02-16 MED FILL — CIPROFLOXACIN HCL 750 MG TA: 750 | 10 days supply | Qty: 20 | Fill #0

## 2017-02-16 MED FILL — AMOX-CLAV 875-125 MG TABLET: 875-125 | 10 days supply | Qty: 20 | Fill #0

## 2017-02-16 MED FILL — FLUCONAZOLE 150 MG TABLET: 150 | 1 days supply | Qty: 1 | Fill #0

## 2017-02-16 MED FILL — TROKENDI XR 25 MG CAPSULE: 25 | 30 days supply | Qty: 30 | Fill #0

## 2017-02-16 NOTE — Addendum Note (Signed)
Addended by: Silverio Decamp on: 02/16/2017 01:34 PM   Modules accepted: Orders

## 2017-02-16 NOTE — Addendum Note (Signed)
Addended by: Silverio Decamp on: 02/16/2017 01:32 PM   Modules accepted: Orders

## 2017-02-16 NOTE — Telephone Encounter (Signed)
Also adding long-acting topiramate for migraine prophylaxis, short acting topiramate was intolerable. May use with atenolol 25 mg, keep migraine diary.

## 2017-02-16 NOTE — Telephone Encounter (Signed)
Patient intolerant to metronidazole, has leukocytosis, abdominal pain with diarrhea, bloody at times. Stool cultures and urine culture pending, adding ciprofloxacin and Augmentin, Augmentin ideally for anaerobic coverage. Patient refuses metronidazole.

## 2017-02-16 NOTE — Assessment & Plan Note (Signed)
Continue atenolol 25, Maxalt as needed, adding long-acting topiramate/Trokendi, keep migraine diary.

## 2017-02-17 LAB — URINE CULTURE: Organism ID, Bacteria: NO GROWTH

## 2017-02-17 LAB — C. DIFFICILE GDH AND TOXIN A/B
C. difficile GDH: NOT DETECTED
C. difficile Toxin A/B: NOT DETECTED

## 2017-02-19 ENCOUNTER — Encounter (HOSPITAL_COMMUNITY): Payer: Self-pay

## 2017-02-19 ENCOUNTER — Emergency Department (HOSPITAL_COMMUNITY): Payer: 59

## 2017-02-19 ENCOUNTER — Inpatient Hospital Stay (HOSPITAL_COMMUNITY)
Admission: EM | Admit: 2017-02-19 | Discharge: 2017-02-22 | DRG: 813 | Disposition: A | Discharge status: Home / Self Care | Payer: 59 | Attending: Internal Medicine | Admitting: Internal Medicine

## 2017-02-19 ENCOUNTER — Inpatient Hospital Stay (HOSPITAL_BASED_OUTPATIENT_CLINIC_OR_DEPARTMENT_OTHER): Admission: RE | Admit: 2017-02-19 | Payer: 59 | Source: Ambulatory Visit

## 2017-02-19 DIAGNOSIS — K922 Gastrointestinal hemorrhage, unspecified: Secondary | ICD-10-CM | POA: Diagnosis not present

## 2017-02-19 DIAGNOSIS — Z8249 Family history of ischemic heart disease and other diseases of the circulatory system: Secondary | ICD-10-CM

## 2017-02-19 DIAGNOSIS — K5190 Ulcerative colitis, unspecified, without complications: Secondary | ICD-10-CM | POA: Diagnosis present

## 2017-02-19 DIAGNOSIS — D72829 Elevated white blood cell count, unspecified: Secondary | ICD-10-CM | POA: Diagnosis not present

## 2017-02-19 DIAGNOSIS — D509 Iron deficiency anemia, unspecified: Secondary | ICD-10-CM | POA: Diagnosis not present

## 2017-02-19 DIAGNOSIS — K921 Melena: Secondary | ICD-10-CM | POA: Diagnosis present

## 2017-02-19 DIAGNOSIS — R000 Tachycardia, unspecified: Secondary | ICD-10-CM | POA: Diagnosis not present

## 2017-02-19 DIAGNOSIS — D62 Acute posthemorrhagic anemia: Secondary | ICD-10-CM | POA: Diagnosis present

## 2017-02-19 DIAGNOSIS — R51 Headache: Secondary | ICD-10-CM | POA: Diagnosis not present

## 2017-02-19 DIAGNOSIS — K529 Noninfective gastroenteritis and colitis, unspecified: Secondary | ICD-10-CM | POA: Diagnosis not present

## 2017-02-19 DIAGNOSIS — Z882 Allergy status to sulfonamides status: Secondary | ICD-10-CM | POA: Diagnosis not present

## 2017-02-19 DIAGNOSIS — D693 Immune thrombocytopenic purpura: Secondary | ICD-10-CM | POA: Diagnosis not present

## 2017-02-19 DIAGNOSIS — Z888 Allergy status to other drugs, medicaments and biological substances status: Secondary | ICD-10-CM

## 2017-02-19 DIAGNOSIS — Z87891 Personal history of nicotine dependence: Secondary | ICD-10-CM

## 2017-02-19 DIAGNOSIS — F329 Major depressive disorder, single episode, unspecified: Secondary | ICD-10-CM | POA: Diagnosis present

## 2017-02-19 DIAGNOSIS — Z792 Long term (current) use of antibiotics: Secondary | ICD-10-CM

## 2017-02-19 DIAGNOSIS — Z8349 Family history of other endocrine, nutritional and metabolic diseases: Secondary | ICD-10-CM | POA: Diagnosis not present

## 2017-02-19 DIAGNOSIS — D696 Thrombocytopenia, unspecified: Secondary | ICD-10-CM | POA: Diagnosis present

## 2017-02-19 DIAGNOSIS — Z79899 Other long term (current) drug therapy: Secondary | ICD-10-CM

## 2017-02-19 DIAGNOSIS — D72828 Other elevated white blood cell count: Secondary | ICD-10-CM | POA: Diagnosis not present

## 2017-02-19 DIAGNOSIS — Z862 Personal history of diseases of the blood and blood-forming organs and certain disorders involving the immune mechanism: Secondary | ICD-10-CM | POA: Diagnosis present

## 2017-02-19 DIAGNOSIS — I493 Ventricular premature depolarization: Secondary | ICD-10-CM | POA: Diagnosis not present

## 2017-02-19 DIAGNOSIS — Z833 Family history of diabetes mellitus: Secondary | ICD-10-CM

## 2017-02-19 LAB — CBC
HCT: 27.1 % — ABNORMAL LOW (ref 36.0–46.0)
HEMOGLOBIN: 9.2 g/dL — AB (ref 12.0–15.0)
MCH: 29.1 pg (ref 26.0–34.0)
MCHC: 33.9 g/dL (ref 30.0–36.0)
MCV: 85.8 fL (ref 78.0–100.0)
RBC: 3.16 MIL/uL — ABNORMAL LOW (ref 3.87–5.11)
RDW: 13.1 % (ref 11.5–15.5)
WBC: 23.2 10*3/uL — ABNORMAL HIGH (ref 4.0–10.5)

## 2017-02-19 LAB — COMPREHENSIVE METABOLIC PANEL
ALBUMIN: 2.8 g/dL — AB (ref 3.5–5.0)
ALK PHOS: 42 U/L (ref 38–126)
ALT: 9 U/L — ABNORMAL LOW (ref 14–54)
AST: 10 U/L — AB (ref 15–41)
Anion gap: 6 (ref 5–15)
BUN: 7 mg/dL (ref 6–20)
CALCIUM: 7.6 mg/dL — AB (ref 8.9–10.3)
CO2: 22 mmol/L (ref 22–32)
CREATININE: 0.65 mg/dL (ref 0.44–1.00)
Chloride: 108 mmol/L (ref 101–111)
GFR calc Af Amer: >60 mL/min (ref >60)
GFR calc non Af Amer: >60 mL/min (ref >60)
GLUCOSE: 110 mg/dL — AB (ref 65–99)
Potassium: 3.4 mmol/L — ABNORMAL LOW (ref 3.5–5.1)
SODIUM: 136 mmol/L (ref 135–145)
Total Bilirubin: 0.2 mg/dL — ABNORMAL LOW (ref 0.3–1.2)
Total Protein: 5.9 g/dL — ABNORMAL LOW (ref 6.5–8.1)

## 2017-02-19 LAB — I-STAT BETA HCG BLOOD, ED (MC, WL, AP ONLY)

## 2017-02-19 LAB — PROTIME-INR
INR: 1.11
Prothrombin Time: 14.3 seconds (ref 11.4–15.2)

## 2017-02-19 LAB — APTT: aPTT: 25 seconds (ref 24–36)

## 2017-02-19 LAB — ABO/RH: ABO/RH(D): A POS

## 2017-02-19 MED ORDER — IOPAMIDOL (ISOVUE-300) INJECTION 61%
100.0000 mL | Freq: Once | INTRAVENOUS | Status: AC | PRN
  Administered 2017-02-19: 100 mL via INTRAVENOUS

## 2017-02-19 MED ORDER — IOPAMIDOL (ISOVUE-300) INJECTION 61%
30.0000 mL | Freq: Once | INTRAVENOUS | Status: AC | PRN
  Administered 2017-02-19: 30 mL via ORAL

## 2017-02-19 MED ORDER — IOPAMIDOL (ISOVUE-300) INJECTION 61%
INTRAVENOUS | Status: AC
  Filled 2017-02-19: qty 100

## 2017-02-19 MED ORDER — IOPAMIDOL (ISOVUE-300) INJECTION 61%
INTRAVENOUS | Status: AC
  Administered 2017-02-19: 30 mL via ORAL
  Filled 2017-02-19: qty 30

## 2017-02-19 NOTE — ED Triage Notes (Addendum)
States increasing WBC with bright red GI bleed with clots voiced no pain and states loosing weight no fever per pt.with nausea voiced. States petechia noted on upper extremities and tongue no drooling noted clear speech noted.

## 2017-02-19 NOTE — Addendum Note (Signed)
Addended by: Silverio Decamp on: 02/19/2017 06:09 PM   Modules accepted: Orders

## 2017-02-19 NOTE — ED Provider Notes (Signed)
[facility] DEPT Provider Note   CSN: [id number] Arrival date & time: 02/19/17  1956     History   Chief Complaint Chief Complaint  Patient presents with  . GI Bleeding    HPI Maty L Costilow is a 36 y.o. female.  Patient is a 36 year old female with no significant past medical history who presents with rectal bleeding. She states over last 2 months she's had some intermittent lower abdominal cramping with some loose stools. This usually happens in the morning and then she is fine for the rest of the day. She states over the last week or so she's had some intermittent blood-streaked with her stools. Over the last 3-4 days she's had a blood with clots. She saw her physician to did some blood work including stool studies which came back negative on culture and C. difficile testing. She was started on Cipro and Augmentin. She's taking it for the last 3 days but has only taken 1 dose per day. Today she had repeat blood work which showed markedly low platelet count at 1000 and markedly elevated WBCs at 30,000. She also noticed today that she started having petechiae in her mouth and her extremities. She denies any other bleeding. She denies any other new medications. She has not been on any recent antibiotics other than the Cipro and Augmentin that she started radiating to go. She denies any fevers. She's had a little bit of nausea but no vomiting. No urinary symptoms or hematuria.      History reviewed. No pertinent past medical history.  Patient Active Problem List   Diagnosis Date Noted  . Acute ITP ([place]) 02/20/2017  . Hematochezia 02/20/2017  . Acute blood loss anemia 02/20/2017  . Thrombocytopenia ([place]) 02/20/2017  . Colitis 02/20/2017  . Leukocytosis 02/20/2017  . Acute abdominal pain 02/14/2017  . Keratosis pilaris 11/27/2015  . Depression 09/30/2014  . Intractable migraine without aura and with status migrainosus 09/30/2014  . IDA (iron deficiency anemia) 06/18/2013  . PVC  (premature ventricular contraction) 05/28/2013    Past Surgical History:  Procedure Laterality Date  . BREAST ENHANCEMENT SURGERY Bilateral    2003  . ovarirectomy      right for cyst    OB History    No data available       Home Medications    Prior to Admission medications   Medication Sig Start Date End Date Taking? Authorizing Provider  amoxicillin-clavulanate (AUGMENTIN) 875-125 MG tablet Take 1 tablet by mouth 2 (two) times daily. 02/16/17 02/26/17 Yes Silverio Decamp, MD  atenolol (TENORMIN) 25 MG tablet Take 1.5 tablets (37.5 mg total) by mouth daily. 03/31/16  Yes Silverio Decamp, MD  ciprofloxacin (CIPRO) 750 MG tablet Take 1 tablet (750 mg total) by mouth 2 (two) times daily. 02/16/17 02/26/17 Yes Silverio Decamp, MD  citalopram (CELEXA) 40 MG tablet Take 0.5 tablets (20 mg total) by mouth daily. 07/09/16  Yes Silverio Decamp, MD  drospirenone-ethinyl estradiol Sherrill Raring) 3-0.02 MG tablet Take 1 tablet by mouth daily. Please fill [facility] only. Skip placebos. 12/28/16  Yes Breeback, Jade L, PA-C  ondansetron (ZOFRAN-ODT) 8 MG disintegrating tablet Take 8 mg by mouth every 6 (six) hours as needed for nausea or vomiting.  01/30/17  Yes [provider]  Prenatal Vit-Fe Fumarate-FA (PRENATAL VITAMIN PLUS LOW IRON) 27-1 MG TABS Take 1 tablet by mouth daily. 11/08/16  Yes Silverio Decamp, MD  rizatriptan (MAXALT-MLT) 5 MG disintegrating tablet Take 1 tablet (5 mg total) by  mouth as needed for migraine. May repeat in 2 hours if needed 02/18/16  Yes Silverio Decamp, MD  Topiramate ER (TROKENDI XR) 25 MG CP24 Take 1 tablet by mouth daily. Patient taking differently: Take 25 mg by mouth daily.  02/16/17  Yes Silverio Decamp, MD  tretinoin (RETIN-A) 0.1 % cream Apply topically at bedtime. 11/26/15  Yes Silverio Decamp, MD    Family History Family History  Problem Relation Age of Onset  . Hyperlipidemia Father   .  Hypertension Father   . Heart attack Father   . Diabetes Maternal Grandfather   . Heart attack Paternal Grandfather     Social History Social History  Substance Use Topics  . Smoking status: Former Research scientist (life sciences)  . Smokeless tobacco: Never Used  . Alcohol use Not on file     Allergies   Sulfa antibiotics and Topamax [topiramate]   Review of Systems Review of Systems  Constitutional: Negative for chills, diaphoresis, fatigue and fever.  HENT: Negative for congestion, rhinorrhea and sneezing.   Eyes: Negative.   Respiratory: Negative for cough, chest tightness and shortness of breath.   Cardiovascular: Negative for chest pain and leg swelling.  Gastrointestinal: Positive for abdominal pain, blood in stool, diarrhea and nausea. Negative for vomiting.  Genitourinary: Negative for difficulty urinating, flank pain, frequency and hematuria.  Musculoskeletal: Negative for arthralgias and back pain.  Skin: Positive for rash.  Neurological: Negative for dizziness, speech difficulty, weakness, numbness and headaches.     Physical Exam Updated Vital Signs BP (!) 108/55 (BP Location: Right Arm)   Pulse 97   Temp 98 F (36.7 C) (Oral)   Resp 18   Ht 5\' 5"  (1.651 m)   Wt 60.3 kg (133 lb)   SpO2 95%   BMI 22.13 kg/m   Physical Exam  Constitutional: She is oriented to person, place, and time. She appears well-developed and well-nourished.  HENT:  Head: Normocephalic and atraumatic.  Oral petechiae noted to the tongue and lip  Eyes: Pupils are equal, round, and reactive to light.  Neck: Normal range of motion. Neck supple.  Cardiovascular: Normal rate, regular rhythm and normal heart sounds.   Pulmonary/Chest: Effort normal and breath sounds normal. No respiratory distress. She has no wheezes. She has no rales. She exhibits no tenderness.  Abdominal: Soft. Bowel sounds are normal. There is no tenderness. There is no rebound and no guarding.  Soft nonbleeding external hemorrhoids,  grossly bloody stool  Musculoskeletal: Normal range of motion. She exhibits no edema.  Lymphadenopathy:    She has no cervical adenopathy.  Neurological: She is alert and oriented to person, place, and time.  Skin: Skin is warm and dry. Rash noted.  Petechiae noted to the hands and lower extremities  Psychiatric: She has a normal mood and affect.     ED Treatments / Results  Labs (all labs ordered are listed, but only abnormal results are displayed) Labs Reviewed  COMPREHENSIVE METABOLIC PANEL - Abnormal; Notable for the following:       Result Value   Potassium 3.4 (*)    Glucose, Bld 110 (*)    Calcium 7.6 (*)    Total Protein 5.9 (*)    Albumin 2.8 (*)    AST 10 (*)    ALT 9 (*)    Total Bilirubin 0.2 (*)    All other components within normal limits  CBC - Abnormal; Notable for the following:    WBC 23.2 (*)    RBC 3.16 (*)  Hemoglobin 9.2 (*)    HCT 27.1 (*)    Platelets <5 (*)    All other components within normal limits  PROTIME-INR  APTT  HIV ANTIBODY (ROUTINE TESTING)  DIC (DISSEMINATED INTRAVASCULAR COAGULATION) PANEL  CBC  BASIC METABOLIC PANEL  I-STAT BETA HCG BLOOD, ED ([facility], [facility], [facility] ONLY)  TYPE AND SCREEN  ABO/RH    EKG  EKG Interpretation None       Radiology Ct Abdomen Pelvis W Contrast  Result Date: 02/19/2017 CLINICAL DATA:  Bright red blood per rectum.  Leukocytosis. EXAM: CT ABDOMEN AND PELVIS WITH CONTRAST TECHNIQUE: Multidetector CT imaging of the abdomen and pelvis was performed using the standard protocol following bolus administration of intravenous contrast. CONTRAST:  189mL ISOVUE-300 IOPAMIDOL (ISOVUE-300) INJECTION 61% COMPARISON:  None. FINDINGS: Lower chest: No acute abnormality. Hepatobiliary: No focal liver abnormality is seen. No gallstones, gallbladder wall thickening, or biliary dilatation. Pancreas: Unremarkable. No pancreatic ductal dilatation or surrounding inflammatory changes. Spleen: Normal in size without focal  abnormality. Adrenals/Urinary Tract: Adrenal glands are unremarkable. Kidneys are normal, without renal calculi, focal lesion, or hydronephrosis. Bladder is unremarkable. Stomach/Bowel: Stomach and small bowel are normal. Appendix is normal. Enteric contrast has reached the sigmoid colon, where it is diluted by intraluminal fluid. There is mural thickening and edema throughout the colon, suggesting colitis. No bowel obstruction. No perforation. Vascular/Lymphatic: No significant vascular findings are present. No enlarged abdominal or pelvic lymph nodes. Reproductive: Uterus and bilateral adnexa are unremarkable. Other: Small volume ascites. Musculoskeletal: No significant skeletal abnormality. Incidental vertebral hemangioma at T12. IMPRESSION: Mural thickening and edema throughout the colon, likely colitis. No bowel obstruction or perforation. Small volume ascites. Electronically Signed   By: Andreas Newport M.D.   On: 02/19/2017 22:42    Procedures Procedures (including critical care time)  Medications Ordered in ED Medications  iopamidol (ISOVUE-300) 61 % injection (not administered)  0.9 %  sodium chloride infusion (not administered)  atenolol (TENORMIN) tablet 37.5 mg (not administered)  citalopram (CELEXA) tablet 20 mg (not administered)  ondansetron (ZOFRAN-ODT) disintegrating tablet 8 mg (not administered)  Topiramate ER CP24 25 mg (not administered)  rizatriptan (MAXALT-MLT) disintegrating tablet 5 mg (not administered)  PRENATAL VITAMIN PLUS LOW IRON 27-1 MG TABS 1 tablet (not administered)  tretinoin (RETIN-A) 0.1 % cream (not administered)  acetaminophen (TYLENOL) tablet 1,000 mg (not administered)  iopamidol (ISOVUE-300) 61 % injection 30 mL (30 mLs Oral Contrast Given 02/19/17 2057)  iopamidol (ISOVUE-300) 61 % injection 100 mL (100 mLs Intravenous Contrast Given 02/19/17 2225)     Initial Impression / Assessment and Plan / ED Course  I have reviewed the triage vital signs and  the nursing notes.  Pertinent labs & imaging results that were available during my care of the patient were reviewed by me and considered in my medical decision making (see chart for details).     Patient is a 36 year old female who presents with some nonspecific abdominal cramping with progressive lower GI bleeding. She's noted to have thrombocytopenia and elevated white count. Her CT scan shows colitis. Her vital signs are stable. She has no evidence of renal failure so I feel that HUS is less likely. She has no neurologic changes. She is alert and oriented to surroundings. ITP is a possibility. I spoke with Dr. Loletha Carrow with [facility] who will see the patient in the morning. I also spoke with Dr. Alen Blew with oncology who does not recommend platelet transfusion. He recommends starting the patient on steroids. I spoke with Dr.  Eulas Post with the hospitalist service to admit the patient.  Final Clinical Impressions(s) / ED Diagnoses   Final diagnoses:  Lower GI bleed  Thrombocytopenia ([place])  Colitis    New Prescriptions New Prescriptions   No medications on file     Malvin Johns, MD 02/20/17 [phone]

## 2017-02-20 DIAGNOSIS — I493 Ventricular premature depolarization: Secondary | ICD-10-CM | POA: Diagnosis present

## 2017-02-20 DIAGNOSIS — D62 Acute posthemorrhagic anemia: Secondary | ICD-10-CM

## 2017-02-20 DIAGNOSIS — Z87891 Personal history of nicotine dependence: Secondary | ICD-10-CM | POA: Diagnosis not present

## 2017-02-20 DIAGNOSIS — Z833 Family history of diabetes mellitus: Secondary | ICD-10-CM | POA: Diagnosis not present

## 2017-02-20 DIAGNOSIS — K529 Noninfective gastroenteritis and colitis, unspecified: Secondary | ICD-10-CM

## 2017-02-20 DIAGNOSIS — D72828 Other elevated white blood cell count: Secondary | ICD-10-CM | POA: Diagnosis not present

## 2017-02-20 DIAGNOSIS — D693 Immune thrombocytopenic purpura: Principal | ICD-10-CM

## 2017-02-20 DIAGNOSIS — D696 Thrombocytopenia, unspecified: Secondary | ICD-10-CM | POA: Diagnosis not present

## 2017-02-20 DIAGNOSIS — Z862 Personal history of diseases of the blood and blood-forming organs and certain disorders involving the immune mechanism: Secondary | ICD-10-CM | POA: Diagnosis present

## 2017-02-20 DIAGNOSIS — D509 Iron deficiency anemia, unspecified: Secondary | ICD-10-CM

## 2017-02-20 DIAGNOSIS — Z882 Allergy status to sulfonamides status: Secondary | ICD-10-CM | POA: Diagnosis not present

## 2017-02-20 DIAGNOSIS — K921 Melena: Secondary | ICD-10-CM

## 2017-02-20 DIAGNOSIS — R000 Tachycardia, unspecified: Secondary | ICD-10-CM | POA: Diagnosis present

## 2017-02-20 DIAGNOSIS — K5190 Ulcerative colitis, unspecified, without complications: Secondary | ICD-10-CM | POA: Diagnosis present

## 2017-02-20 DIAGNOSIS — F329 Major depressive disorder, single episode, unspecified: Secondary | ICD-10-CM | POA: Diagnosis present

## 2017-02-20 DIAGNOSIS — Z888 Allergy status to other drugs, medicaments and biological substances status: Secondary | ICD-10-CM | POA: Diagnosis not present

## 2017-02-20 DIAGNOSIS — Z792 Long term (current) use of antibiotics: Secondary | ICD-10-CM | POA: Diagnosis not present

## 2017-02-20 DIAGNOSIS — Z8249 Family history of ischemic heart disease and other diseases of the circulatory system: Secondary | ICD-10-CM | POA: Diagnosis not present

## 2017-02-20 DIAGNOSIS — D72829 Elevated white blood cell count, unspecified: Secondary | ICD-10-CM | POA: Diagnosis present

## 2017-02-20 DIAGNOSIS — Z79899 Other long term (current) drug therapy: Secondary | ICD-10-CM | POA: Diagnosis not present

## 2017-02-20 DIAGNOSIS — R51 Headache: Secondary | ICD-10-CM | POA: Diagnosis not present

## 2017-02-20 DIAGNOSIS — Z8349 Family history of other endocrine, nutritional and metabolic diseases: Secondary | ICD-10-CM | POA: Diagnosis not present

## 2017-02-20 LAB — BASIC METABOLIC PANEL
Anion gap: 7 (ref 5–15)
BUN: <5 mg/dL — ABNORMAL LOW (ref 6–20)
CHLORIDE: 109 mmol/L (ref 101–111)
CO2: 22 mmol/L (ref 22–32)
Calcium: 8 mg/dL — ABNORMAL LOW (ref 8.9–10.3)
Creatinine, Ser: 0.6 mg/dL (ref 0.44–1.00)
GFR calc non Af Amer: >60 mL/min (ref >60)
Glucose, Bld: 148 mg/dL — ABNORMAL HIGH (ref 65–99)
POTASSIUM: 3.5 mmol/L (ref 3.5–5.1)
SODIUM: 138 mmol/L (ref 135–145)

## 2017-02-20 LAB — HIV ANTIBODY (ROUTINE TESTING W REFLEX): HIV SCREEN 4TH GENERATION: NONREACTIVE

## 2017-02-20 LAB — CBC
HCT: 22.6 % — ABNORMAL LOW (ref 36.0–46.0)
HEMATOCRIT: 24.6 % — AB (ref 36.0–46.0)
HEMOGLOBIN: 8.4 g/dL — AB (ref 12.0–15.0)
Hemoglobin: 7.7 g/dL — ABNORMAL LOW (ref 12.0–15.0)
MCH: 29.7 pg (ref 26.0–34.0)
MCH: 30 pg (ref 26.0–34.0)
MCHC: 34.1 g/dL (ref 30.0–36.0)
MCHC: 34.1 g/dL (ref 30.0–36.0)
MCV: 86.9 fL (ref 78.0–100.0)
MCV: 87.9 fL (ref 78.0–100.0)
PLATELETS: 18 10*3/uL — AB (ref 150–400)
Platelets: <5 10*3/uL — CL (ref 150–400)
RBC: 2.83 MIL/uL — AB (ref 3.87–5.11)
RBC: 2.57 MIL/uL — AB (ref 3.87–5.11)
RDW: 13.3 % (ref 11.5–15.5)
RDW: 13.6 % (ref 11.5–15.5)
WBC: 26.3 10*3/uL — ABNORMAL HIGH (ref 4.0–10.5)
WBC: 31.7 10*3/uL — AB (ref 4.0–10.5)

## 2017-02-20 LAB — DIC (DISSEMINATED INTRAVASCULAR COAGULATION)PANEL
D-Dimer, Quant: 2.61 ug/mL-FEU — ABNORMAL HIGH (ref 0.00–0.50)
Fibrinogen: 278 mg/dL (ref 210–475)
Prothrombin Time: 14.9 seconds (ref 11.4–15.2)
aPTT: 25 seconds (ref 24–36)

## 2017-02-20 LAB — DIC (DISSEMINATED INTRAVASCULAR COAGULATION) PANEL
INR: 1.16
SMEAR REVIEW: NONE SEEN

## 2017-02-20 LAB — DIFFERENTIAL
BASOS PCT: 0 %
Basophils Absolute: 0 10*3/uL (ref 0.0–0.1)
Eosinophils Absolute: 5 10*3/uL — ABNORMAL HIGH (ref 0.0–0.7)
Eosinophils Relative: 21 %
Lymphocytes Relative: 8 %
Lymphs Abs: 2 10*3/uL (ref 0.7–4.0)
MONO ABS: 0.8 10*3/uL (ref 0.1–1.0)
Monocytes Relative: 3 %
NEUTROS ABS: 16 10*3/uL — AB (ref 1.7–7.7)
Neutrophils Relative %: 67 %

## 2017-02-20 LAB — STOOL CULTURE

## 2017-02-20 MED ORDER — PREDNISONE 50 MG PO TABS
60.0000 mg | ORAL_TABLET | Freq: Every day | ORAL | Status: DC
  Administered 2017-02-20 – 2017-02-22 (×4): 60 mg via ORAL
  Filled 2017-02-20 (×2): qty 1
  Filled 2017-02-20: qty 3
  Filled 2017-02-20: qty 1

## 2017-02-20 MED ORDER — ACETAMINOPHEN 325 MG PO TABS
650.0000 mg | ORAL_TABLET | Freq: Four times a day (QID) | ORAL | Status: DC | PRN
  Administered 2017-02-20 – 2017-02-22 (×2): 650 mg via ORAL
  Filled 2017-02-20 (×2): qty 2

## 2017-02-20 MED ORDER — CITALOPRAM HYDROBROMIDE 20 MG PO TABS: 20.0000 mg | ORAL_TABLET | Freq: Every day | ORAL | Status: DC

## 2017-02-20 MED ORDER — SODIUM CHLORIDE 0.9 % IV SOLN
Freq: Once | INTRAVENOUS | Status: AC
  Administered 2017-02-20: 10:00:00 via INTRAVENOUS

## 2017-02-20 MED ORDER — ATENOLOL 25 MG PO TABS
12.5000 mg | ORAL_TABLET | Freq: Every day | ORAL | Status: DC
  Administered 2017-02-20 – 2017-02-22 (×3): 12.5 mg via ORAL
  Filled 2017-02-20 (×3): qty 1

## 2017-02-20 MED ORDER — TOPIRAMATE ER 25 MG PO CAP24: 25.0000 mg | ORAL_CAPSULE | Freq: Every day | ORAL | Status: DC

## 2017-02-20 MED ORDER — ONDANSETRON 4 MG PO TBDP: 8.0000 mg | ORAL_TABLET | Freq: Four times a day (QID) | ORAL | Status: DC | PRN

## 2017-02-20 MED ORDER — ATENOLOL 12.5 MG HALF TABLET: 37.5000 mg | ORAL_TABLET | Freq: Every day | ORAL | Status: DC

## 2017-02-20 MED ORDER — ACETAMINOPHEN 500 MG PO TABS
1000.0000 mg | ORAL_TABLET | Freq: Once | ORAL | Status: AC
  Administered 2017-02-20: 1000 mg via ORAL
  Filled 2017-02-20: qty 2

## 2017-02-20 MED ORDER — DIPHENHYDRAMINE HCL 25 MG PO CAPS
25.0000 mg | ORAL_CAPSULE | Freq: Once | ORAL | Status: AC
  Administered 2017-02-20: 25 mg via ORAL
  Filled 2017-02-20: qty 1

## 2017-02-20 MED ORDER — PREDNISONE 50 MG PO TABS: 1.0000 mg/kg/d | ORAL_TABLET | Freq: Every day | ORAL | Status: DC

## 2017-02-20 MED ORDER — CITALOPRAM HYDROBROMIDE 20 MG PO TABS
20.0000 mg | ORAL_TABLET | Freq: Every day | ORAL | Status: DC
  Administered 2017-02-20 – 2017-02-22 (×3): 20 mg via ORAL
  Filled 2017-02-20 (×3): qty 1

## 2017-02-20 MED ORDER — PRENATAL PLUS 27-1 MG PO TABS
1.0000 | ORAL_TABLET | Freq: Every day | ORAL | Status: DC
  Administered 2017-02-20 – 2017-02-22 (×3): 1 via ORAL
  Filled 2017-02-20 (×3): qty 1

## 2017-02-20 MED ORDER — SODIUM CHLORIDE 0.9 % IV SOLN
Freq: Once | INTRAVENOUS | Status: AC
  Administered 2017-02-20: via INTRAVENOUS

## 2017-02-20 MED ORDER — AMPICILLIN-SULBACTAM SODIUM 3 (2-1) G IJ SOLR
3.0000 g | Freq: Once | INTRAMUSCULAR | Status: AC
  Administered 2017-02-20: 3 g via INTRAVENOUS
  Filled 2017-02-20: qty 3

## 2017-02-20 MED ORDER — ONDANSETRON HCL 4 MG/2ML IJ SOLN: 4.0000 mg | Freq: Four times a day (QID) | INTRAMUSCULAR | Status: DC | PRN

## 2017-02-20 MED ORDER — TRETINOIN 0.1 % EX CREA: TOPICAL_CREAM | Freq: Every day | CUTANEOUS | Status: DC

## 2017-02-20 MED ORDER — IMMUNE GLOBULIN (HUMAN) 10 GM/100ML IV SOLN
1.0000 g/kg | INTRAVENOUS | Status: AC
  Administered 2017-02-20 – 2017-02-21 (×2): 60 g via INTRAVENOUS
  Filled 2017-02-20 (×2): qty 600

## 2017-02-20 MED ORDER — SUMATRIPTAN SUCCINATE 50 MG PO TABS
50.0000 mg | ORAL_TABLET | ORAL | Status: DC | PRN
  Filled 2017-02-20: qty 1

## 2017-02-20 MED ORDER — SODIUM CHLORIDE 0.9 % IV SOLN
3.0000 g | Freq: Three times a day (TID) | INTRAVENOUS | Status: DC
  Filled 2017-02-20: qty 3

## 2017-02-20 MED ORDER — RIZATRIPTAN BENZOATE 5 MG PO TBDP: 5.0000 mg | ORAL_TABLET | ORAL | Status: DC | PRN

## 2017-02-20 MED ORDER — ACETAMINOPHEN 325 MG PO TABS
650.0000 mg | ORAL_TABLET | Freq: Once | ORAL | Status: AC
  Administered 2017-02-20: 650 mg via ORAL
  Filled 2017-02-20: qty 2

## 2017-02-20 MED ORDER — PREDNISONE 1 MG PO TABS: 1.0000 mg/kg/d | ORAL_TABLET | Freq: Every day | ORAL | Status: DC

## 2017-02-20 MED ORDER — SODIUM CHLORIDE 0.9 % IV SOLN
INTRAVENOUS | Status: DC
  Administered 2017-02-20 – 2017-02-22 (×4): via INTRAVENOUS

## 2017-02-20 NOTE — H&P (Signed)
History and Physical    Jennifer Abbott GNF:[id number] DOB: 01-May-1981 DOA: 02/19/2017  PCP: [name], PA-C  Patient coming from: Home  I have personally briefly reviewed patient's old medical records in [place]  Chief Complaint: BRBPR  HPI: Jennifer Abbott is a 36 y.o. female with medical history significant of previously healthy.  She states over last 2 months she's had some intermittent lower abdominal cramping with some loose stools. This usually happens in the morning and then she is fine for the rest of the day.  She states over the last week or so she's had some intermittent blood-streaked with her stools.  Over the last 3-4 days she's had a blood with clots.  She saw her physician on the 23rd who did some blood work including stool studies which came back negative on culture and C. difficile testing, blood work at that time showed WBC 21k, 4.5k eos, nl platelets.  She was started on Cipro and Augmentin.  She's taking it for the last 3 days but has only taken 1 dose per day.  Today she had repeat blood work which showed markedly low platelet count at 1000 and markedly elevated WBCs at 30,000.  She also noticed today that she started having petechiae in her mouth and her extremities.  She denies any other new medications.  She has not been on any recent antibiotics other than the Cipro and Augmentin that she started radiating to go.  She denies any fevers. She's had a little bit of nausea but no vomiting. No urinary symptoms or hematuria.   ED Course: CT scan abd pelvis shows colitis, HGB is 9.3 down from 13 x6 days ago.  Platelets are <5 down from 341 x6 days ago.  Diff shows 5k eos.  Patient has absolutely normal mental status, and BUN and creat are also NL.  No fever.  Vitals are baseline for this patient (she says her BP usually runs low 100s/60s, and she has h/o mild tachycardia w/ PVCs that she takes atenolol for).   Review of Systems: As per HPI otherwise 10 point review  of systems negative.   History reviewed. No pertinent past medical history.  Past Surgical History:  Procedure Laterality Date  . BREAST ENHANCEMENT SURGERY Bilateral    2003  . ovarirectomy      right for cyst     reports that she has quit smoking. She has never used smokeless tobacco. Her alcohol and drug histories are not on file.  Allergies  Allergen Reactions  . Sulfa Antibiotics     Excessive yeast infection overload.  . Topamax [Topiramate]     Extreme taste changes.     Family History  Problem Relation Age of Onset  . Hyperlipidemia Father   . Hypertension Father   . Heart attack Father   . Diabetes Maternal Grandfather   . Heart attack Paternal Grandfather      Prior to Admission medications   Medication Sig Start Date End Date Taking? Authorizing Provider  amoxicillin-clavulanate (AUGMENTIN) 875-125 MG tablet Take 1 tablet by mouth 2 (two) times daily. 02/16/17 02/26/17 Yes Silverio Decamp, MD  atenolol (TENORMIN) 25 MG tablet Take 1.5 tablets (37.5 mg total) by mouth daily. 03/31/16  Yes Silverio Decamp, MD  ciprofloxacin (CIPRO) 750 MG tablet Take 1 tablet (750 mg total) by mouth 2 (two) times daily. 02/16/17 02/26/17 Yes Silverio Decamp, MD  citalopram (CELEXA) 40 MG tablet Take 0.5 tablets (20 mg total) by  mouth daily. 07/09/16  Yes Silverio Decamp, MD  drospirenone-ethinyl estradiol Sherrill Raring) 3-0.02 MG tablet Take 1 tablet by mouth daily. Please fill Gianvi only. Skip placebos. 12/28/16  Yes Breeback, Jade L, PA-C  ondansetron (ZOFRAN-ODT) 8 MG disintegrating tablet Take 8 mg by mouth every 6 (six) hours as needed for nausea or vomiting.  01/30/17  Yes [provider]  Prenatal Vit-Fe Fumarate-FA (PRENATAL VITAMIN PLUS LOW IRON) 27-1 MG TABS Take 1 tablet by mouth daily. 11/08/16  Yes Silverio Decamp, MD  rizatriptan (MAXALT-MLT) 5 MG disintegrating tablet Take 1 tablet (5 mg total) by mouth as needed for migraine. May  repeat in 2 hours if needed 02/18/16  Yes Silverio Decamp, MD  Topiramate ER (TROKENDI XR) 25 MG CP24 Take 1 tablet by mouth daily. Patient taking differently: Take 25 mg by mouth daily.  02/16/17  Yes Silverio Decamp, MD  tretinoin (RETIN-A) 0.1 % cream Apply topically at bedtime. 11/26/15  Yes Silverio Decamp, MD    Physical Exam: Vitals:   02/19/17 2204 02/19/17 2235 02/19/17 2300 02/20/17 0000  BP: 102/68 102/65 107/72 (!) 108/55  Pulse: 93 100 (!) 104 97  Resp: 18 16 19 18   Temp:      TempSrc:      SpO2: 100% 99% 100% 95%  Weight:      Height:        Constitutional: NAD, calm, comfortable Eyes: PERRL, lids and conjunctivae normal ENMT: Mucous membranes are moist. Posterior pharynx clear of any exudate or lesions.Normal dentition.  Neck: normal, supple, no masses, no thyromegaly Respiratory: clear to auscultation bilaterally, no wheezing, no crackles. Normal respiratory effort. No accessory muscle use.  Cardiovascular: Regular rate and rhythm, no murmurs / rubs / gallops. No extremity edema. 2+ pedal pulses. No carotid bruits.  Abdomen: no tenderness, no masses palpated. No hepatosplenomegaly. Bowel sounds positive.  Musculoskeletal: no clubbing / cyanosis. No joint deformity upper and lower extremities. Good ROM, no contractures. Normal muscle tone.  Skin: Purpura of mouth, lips, and few other spots on extremities. Neurologic: CN 2-12 grossly intact. Sensation intact, DTR normal. Strength 5/5 in all 4.  Psychiatric: Normal judgment and insight. Alert and oriented x 3. Normal mood.    Labs on Admission: I have personally reviewed following labs and imaging studies  CBC:  Recent Labs Lab 02/14/17 0914 02/19/17 2038  WBC 21.9* 23.2*  NEUTROABS 13,359* 16.0*  HGB 13.0 9.2*  HCT 37.9 27.1*  MCV 88.3 85.8  PLT 341 <5*   Basic Metabolic Panel:  Recent Labs Lab 02/14/17 0914 02/19/17 2038  NA 136 136  K 4.1 3.4*  CL 101 108  CO2 27 22  GLUCOSE 74  110*  BUN 9 7  CREATININE 0.74 0.65  CALCIUM 8.7 7.6*   GFR: Estimated Creatinine Clearance: 85.8 mL/min (by C-G formula based on SCr of 0.65 mg/dL). Liver Function Tests:  Recent Labs Lab 02/14/17 0914 02/19/17 2038  AST 11 10*  ALT 10 9*  ALKPHOS 59 42  BILITOT 0.2 0.2*  PROT 6.9 5.9*  ALBUMIN 3.7 2.8*    Recent Labs Lab 02/14/17 0914  LIPASE <5*  AMYLASE 15*   No results for input(s): AMMONIA in the last 168 hours. Coagulation Profile:  Recent Labs Lab 02/19/17 2038  INR 1.11   Cardiac Enzymes: No results for input(s): CKTOTAL, CKMB, CKMBINDEX, TROPONINI in the last 168 hours. BNP (last 3 results) No results for input(s): PROBNP in the last 8760 hours. HbA1C: No results for input(s):  HGBA1C in the last 72 hours. CBG: No results for input(s): GLUCAP in the last 168 hours. Lipid Profile: No results for input(s): CHOL, HDL, LDLCALC, TRIG, CHOLHDL, LDLDIRECT in the last 72 hours. Thyroid Function Tests: No results for input(s): TSH, T4TOTAL, FREET4, T3FREE, THYROIDAB in the last 72 hours. Anemia Panel: No results for input(s): VITAMINB12, FOLATE, FERRITIN, TIBC, IRON, RETICCTPCT in the last 72 hours. Urine analysis:    Component Value Date/Time   COLORURINE CANCELED 02/14/2017 0914   APPEARANCEUR CANCELED 02/14/2017 0914   LABSPEC CANCELED 02/14/2017 0914   PHURINE CANCELED 02/14/2017 0914   GLUCOSEU CANCELED 02/14/2017 0914   HGBUR CANCELED 02/14/2017 0914   BILIRUBINUR CANCELED 02/14/2017 0914   BILIRUBINUR neg 12/29/2012 1654   KETONESUR CANCELED 02/14/2017 0914   PROTEINUR CANCELED 02/14/2017 0914   UROBILINOGEN 0.2 12/29/2012 1654   NITRITE CANCELED 02/14/2017 0914   LEUKOCYTESUR CANCELED 02/14/2017 0914    Radiological Exams on Admission: Ct Abdomen Pelvis W Contrast  Result Date: 02/19/2017 CLINICAL DATA:  Bright red blood per rectum.  Leukocytosis. EXAM: CT ABDOMEN AND PELVIS WITH CONTRAST TECHNIQUE: Multidetector CT imaging of the abdomen  and pelvis was performed using the standard protocol following bolus administration of intravenous contrast. CONTRAST:  176mL ISOVUE-300 IOPAMIDOL (ISOVUE-300) INJECTION 61% COMPARISON:  None. FINDINGS: Lower chest: No acute abnormality. Hepatobiliary: No focal liver abnormality is seen. No gallstones, gallbladder wall thickening, or biliary dilatation. Pancreas: Unremarkable. No pancreatic ductal dilatation or surrounding inflammatory changes. Spleen: Normal in size without focal abnormality. Adrenals/Urinary Tract: Adrenal glands are unremarkable. Kidneys are normal, without renal calculi, focal lesion, or hydronephrosis. Bladder is unremarkable. Stomach/Bowel: Stomach and small bowel are normal. Appendix is normal. Enteric contrast has reached the sigmoid colon, where it is diluted by intraluminal fluid. There is mural thickening and edema throughout the colon, suggesting colitis. No bowel obstruction. No perforation. Vascular/Lymphatic: No significant vascular findings are present. No enlarged abdominal or pelvic lymph nodes. Reproductive: Uterus and bilateral adnexa are unremarkable. Other: Small volume ascites. Musculoskeletal: No significant skeletal abnormality. Incidental vertebral hemangioma at T12. IMPRESSION: Mural thickening and edema throughout the colon, likely colitis. No bowel obstruction or perforation. Small volume ascites. Electronically Signed   By: Andreas Newport M.D.   On: 02/19/2017 22:42    EKG: Independently reviewed.  Assessment/Plan Principal Problem:   Acute ITP (HCC) Active Problems:   Hematochezia   Acute blood loss anemia   Thrombocytopenia (HCC)   Colitis   Leukocytosis    1. Thrombocytopenia - 1. most likely ITP: 1. TTP less likely with NL mental status and NL kidney function 2. DIC also less likely 2. DIC panel ordered 3. Spoke with Dr. Alen Blew: 1. ITP most likely 2. 1mg /kg/day prednisone PO 3. For now would empirically treat colitis 4. No platelet  transfusion unless she becomes hemodynamically unstable 5. Add his name as consulting physician so patient shows up on his list and he will see her in AM. 4. EDP also spoke with GI 1. While this could be autoimmune colitis or infectious, I strongly doubt that they will recommend urgent colonoscopy on a patient with platelet count less than 5. 2. Dr. Loletha Carrow did say they would see patient in AM 2. Acute blood loss anemia 1. Repeat CBC in AM 2. Transfuse if needed 3. Colitis - presumed infectious 1. Will put patient on empiric unasyn (she requests no flagyl due to GI upset it causes)  DVT prophylaxis: SCDs Code Status: Full Family Communication: No family in room Disposition Plan: Home after admit  Consults called: Dr. Alen Blew called as above.  Believe that EDP also spoke with GI. Admission status: Admit to inpatient   [place], [facility] Pager [phone]  If 7AM-7PM, please contact day team taking care of patient [email or web address] Password [email or web address]  02/20/2017, 1:12 AM

## 2017-02-20 NOTE — Progress Notes (Signed)
Pharmacy Antibiotic Note  Jennifer Abbott is a 36 y.o. female admitted on 02/19/2017 with IAI.  Pharmacy has been consulted for Unasyn dosing.  Plan: Unasyn 3 gm IV q8h Pharmacy to sign off thanks  Height: 5\' 5"  (165.1 cm) Weight: 133 lb (60.3 kg) IBW/kg (Calculated) : 57  Temp (24hrs), Avg:98 F (36.7 C), Min:98 F (36.7 C), Max:98 F (36.7 C)   Recent Labs Lab 02/14/17 0914 02/19/17 2038  WBC 21.9* 23.2*  CREATININE 0.74 0.65    Estimated Creatinine Clearance: 85.8 mL/min (by C-G formula based on SCr of 0.65 mg/dL).    Allergies  Allergen Reactions  . Sulfa Antibiotics     Excessive yeast infection overload.  . Topamax [Topiramate]     Extreme taste changes.     Thank you for allowing pharmacy to be a part of this patient's care.  Eudelia Bunch, Pharm.D. [phone] 02/20/2017 1:24 AM

## 2017-02-20 NOTE — Consult Note (Signed)
Reason for Referral: Thrombocytopenia.   HPI: 36 year old woman without any significant past medical history who currently works as a Dentist. She had been describing symptoms of bloating lower abdominal pain and intermittent hematochezia for the last few months. She had a CBC done by her primary care provider on 02/14/2017 and at that time he was noted that she has white cell count of 21,000 hemoglobin is 13 and a platelet count of 341. Her differential was normal at that time. In the last 24 hours she started noticing more hematochezia and started developing petechial rash. She was seen in the emergency department on 02/19/2017 and a repeat CBC showed pelvic count less than 5 and white cell count of 23,000. Her hemoglobin was down to 9.2. CT scan of the abdomen showed evidence of colitis without any perforation or abscess. She was started on prednisone 60 mg daily and I will repeat a CBC on 02/20/2017 showed white cell count of 26,000 hemoglobin of 8.4 and platelets of less than 5. Clinically, she feels reasonably fair and denied any abdominal pain or discomfort. She denied any hemoptysis or hematemesis. She denied any chest pain or difficulty breathing. She continues to have bright blood per rectum with every bowel movement.   She denies any headaches, blurry vision or change in her mental status. She denies any chest pain, palpitation, orthopnea or leg edema. She does not report any cough, wheezing or hemoptysis. She does not report any nausea, vomiting, constipation or diarrhea. She denied any frequency urgency or hesitancy. She denied any hematuria. She denied any lymphadenopathy. Remaining review of systems unremarkable.   History reviewed. No pertinent past medical history.:  Past Surgical History:  Procedure Laterality Date  . BREAST ENHANCEMENT SURGERY Bilateral    2003  . ovarirectomy      right for cyst  :   Current Facility-Administered  Medications:  .  0.9 %  sodium chloride infusion, , Intravenous, Continuous, Etta Quill, DO, Last Rate: 100 mL/hr at 02/20/17 0142 .  acetaminophen (TYLENOL) tablet 650 mg, 650 mg, Oral, Q6H PRN, Etta Quill, DO .  citalopram (CELEXA) tablet 20 mg, 20 mg, Oral, Daily, Alcario Drought, Jared M, DO .  Immune Globulin 10% (PRIVIGEN) IV infusion 60 g, 1 g/kg, Intravenous, Q24H, Adilynne Fitzwater, Mathis Dad, MD .  iopamidol (ISOVUE-300) 61 % injection, , , ,  .  ondansetron (ZOFRAN) injection 4 mg, 4 mg, Intravenous, Q6H PRN, Alcario Drought, Jared M, DO .  ondansetron (ZOFRAN-ODT) disintegrating tablet 8 mg, 8 mg, Oral, Q6H PRN, Etta Quill, DO .  predniSONE (DELTASONE) tablet 60 mg, 60 mg, Oral, Q breakfast, Malvin Johns, MD, 60 mg at 02/20/17 0133 .  prenatal vitamin w/FE, FA (PRENATAL 1 + 1) 27-1 MG tablet 1 tablet, 1 tablet, Oral, Daily, Alcario Drought, Jared M, DO .  SUMAtriptan (IMITREX) tablet 50 mg, 50 mg, Oral, Q2H PRN, Etta Quill, DO:  Allergies  Allergen Reactions  . Sulfa Antibiotics     Excessive yeast infection overload.  . Topamax [Topiramate]     Extreme taste changes.   :  Family History  Problem Relation Age of Onset  . Hyperlipidemia Father   . Hypertension Father   . Heart attack Father   . Diabetes Maternal Grandfather   . Heart attack Paternal Grandfather   :  Social History   Social History  . Marital status: Single    Spouse name: N/A  . Number of children: N/A  . Years of education:  N/A   Occupational History  . Not on file.   Social History Main Topics  . Smoking status: Former Research scientist (life sciences)  . Smokeless tobacco: Never Used  . Alcohol use Not on file  . Drug use: Unknown  . Sexual activity: No   Other Topics Concern  . Not on file   Social History Narrative  . No narrative on file  :  Pertinent items are noted in HPI.  Exam: Blood pressure (!) 97/51, pulse 88, temperature 98.3 F (36.8 C), temperature source Oral, resp. rate 18, height 5\' 5"  (1.651 m),  weight 137 lb 8 oz (62.4 kg), SpO2 96 %.  ECOG 0 General appearance: alert and cooperative appeared without distress. Throat: lips, mucosa, and tongue normal; teeth and gums normal. Petechiae or rash noted around the site of her mouth. Neck: no adenopathy Resp: clear to auscultation bilaterally without rhonchi or wheezes. Chest wall: no tenderness Cardio: regular rate and rhythm, S1, S2 normal, no murmur, click, rub or gallop GI: soft, non-tender; bowel sounds normal; no masses,  no organomegaly or shifting dullness. Extremities: extremities normal, atraumatic, no cyanosis or edema Skin: Petechiae or rash noted on her upper and lower extremities. Ecchymosis noted around her catheter site. Lymph nodes: Cervical, supraclavicular, and axillary nodes normal.   Recent Labs  02/19/17 2038 02/20/17 0238 02/20/17 0457  WBC 23.2*  --  26.3*  HGB 9.2*  --  8.4*  HCT 27.1*  --  24.6*  PLT <5* <5* <5*    Recent Labs  02/19/17 2038 02/20/17 0457  NA 136 138  K 3.4* 3.5  CL 108 109  CO2 22 22  GLUCOSE 110* 148*  BUN 7 <5*  CREATININE 0.65 0.60  CALCIUM 7.6* 8.0*     Blood smear review: Her smear was personally reviewed today and showed no evidence of schistocytes, red cell fragments or dysplasia. She has little to no platelets noted at this time. Hypochromic red cells noted as well.   Ct Abdomen Pelvis W Contrast  Result Date: 02/19/2017 CLINICAL DATA:  Bright red blood per rectum.  Leukocytosis. EXAM: CT ABDOMEN AND PELVIS WITH CONTRAST TECHNIQUE: Multidetector CT imaging of the abdomen and pelvis was performed using the standard protocol following bolus administration of intravenous contrast. CONTRAST:  144mL ISOVUE-300 IOPAMIDOL (ISOVUE-300) INJECTION 61% COMPARISON:  None. FINDINGS: Lower chest: No acute abnormality. Hepatobiliary: No focal liver abnormality is seen. No gallstones, gallbladder wall thickening, or biliary dilatation. Pancreas: Unremarkable. No pancreatic ductal  dilatation or surrounding inflammatory changes. Spleen: Normal in size without focal abnormality. Adrenals/Urinary Tract: Adrenal glands are unremarkable. Kidneys are normal, without renal calculi, focal lesion, or hydronephrosis. Bladder is unremarkable. Stomach/Bowel: Stomach and small bowel are normal. Appendix is normal. Enteric contrast has reached the sigmoid colon, where it is diluted by intraluminal fluid. There is mural thickening and edema throughout the colon, suggesting colitis. No bowel obstruction. No perforation. Vascular/Lymphatic: No significant vascular findings are present. No enlarged abdominal or pelvic lymph nodes. Reproductive: Uterus and bilateral adnexa are unremarkable. Other: Small volume ascites. Musculoskeletal: No significant skeletal abnormality. Incidental vertebral hemangioma at T12. IMPRESSION: Mural thickening and edema throughout the colon, likely colitis. No bowel obstruction or perforation. Small volume ascites. Electronically Signed   By: Andreas Newport M.D.   On: 02/19/2017 22:42   Dg Abd 2 Views  Result Date: 02/14/2017 CLINICAL DATA:  Abdomen pain with bloating EXAM: ABDOMEN - 2 VIEW COMPARISON:  None. FINDINGS: Lung bases are clear. No free air beneath the diaphragm. Nonobstructed bowel-gas  pattern with mild stool in the colon. No abnormal calcifications. IMPRESSION: Nonobstructed bowel-gas pattern Electronically Signed   By: Donavan Foil M.D.   On: 02/14/2017 19:32    Assessment and Plan:   36 year old woman with the following issues:  1. Thrombocytopenia appears to be autoimmune in etiology: Her platelet count on 02/14/2017 was normal and currently less than 5. Her peripheral smear did not show any evidence of schistocytes and her DIC panel does not support the diagnosis of DIC. TTP, HUS on HIT are extremely unlikely.  This appears to be autoimmune thrombocytopenia related to her colitis. For management standpoint, given her acute bleeding I would proceed  with one unit of platelet transfusion while other measures takes effect. I would repeat platelet transfusion only if she continues to have brisk bleeding in the next 24 hours.  She already on prednisone 60 mg daily which should be adequate at this time. Switching to IV Solu-Medrol at a higher dose would be recommended if no benefit is noted in the next 24-48 hours.  I will start IVIG as well to accelerate her platelet recovery given her hematochezia. She will receive 1 g/kg daily for 2 days in attempt to improve her platelets. Complications associated with platelet transfusion and IVIG were discussed with the patient today. These includes arthralgias, myalgias and infusion related complications. Severe allergic reaction is aware that includes anaphylaxis.  I recommend premedication with Tylenol and Benadryl before platelet transfusion and IVIG.  If these measures are not effective in the next few days and additional therapy may be recommended. I anticipate platelet improvement in the next 48 hours and for platelet counts shows improvement as well as decrease in her hematochezia further management of her ITP can be continued as an outpatient.  2. Colitis: Appears to be autoimmune in nature. Gastroenterology was consulted at this time.  3. Anemia: She has element of microcytosis and hypochromia. I will check her iron studies and supplement with iron given her chronic blood loss. Her hemoglobin was 13 on 02/14/2017 but could be hemoconcentrated.  4. Follow-up: We will follow with you while she is hospitalized. We'll arrange follow-up for her upon discharge once she is medically ready.

## 2017-02-20 NOTE — Progress Notes (Signed)
Patient seen and examined this morning, but admitted overnight by Dr. Alcario Drought. H&P reviewed, agree with the assessment and plan.   In brief, this is a 36 year old woman without significant medical history 1 has been having progressive anemia and blood in her stool over the last 2 months. She also been having intermittent abdominal cramping for several months as well. She was also found to be progressively thrombocytopenic.   Colitis -Gastroenterology suspects ulcerative colitis less likely infectious given longtime course -Antibiotics are discontinued  Thrombocytopenia -Likely due to ITP possibly related ulcerative colitis, continue prednisone, continue IVIG per oncology -Given active GI bleed, transfuse platelets today. Discussed with Dr Alen Blew with oncology and Dr. Loletha Carrow with gastroenterology  GI bleed -In the setting of colitis and thrombocytopenia, no role for a colonoscopy currently  Acute blood loss anemia -Keep hemoglobin above 8, monitor CBCs every 8 hours   Jennifer Abbott M. Cruzita Lederer, MD [facility] [phone]  If 7PM-7AM, please contact night-coverage [email or web address] Password [email or web address]

## 2017-02-20 NOTE — Consult Note (Addendum)
[name] Gastroenterology Consult Note   History Jennifer Abbott MRN # [id number]  Date of Admission: 02/19/2017 Date of Consultation: 02/20/2017 Referring physician: Dr. Caren Griffins, MD  Reason for Consultation/Chief Complaint: Hematochezia  Subjective  HPI:  This is a 36 year old woman transferred to [facility] from the [facility] urgent care in [facility] yesterday for worsening hematochezia over this last week. She reports developing loose stool intermittently about 6 months ago. It was slowly getting worse and then he would occasionally see some streaks of blood. About a week ago she started having more frequent rectal bleeding, and it escalated over the course of this last several days. Earlier in the week she was seen in the urgent care and apparently had a hemoglobin of over 13. Yesterday it had dropped down to 11, but I have now learned that it was not a full CBC and thus was not discovered until transfer to the ED that she had platelet count less than 5. Hematology was consulted over the phone and recommended prednisone for suspected ITP. She is continued to have hematochezia twice overnight, about 30 minutes prior to my encounter. She has some increased borborygmi, but no abdominal pain or cramps. She is having several loose stools per day with the bleeding. Jennifer Abbott also noticed skin lesions over the last several days they're now very clearly petechiae on the underside of her bottom lip and left side of the chin. She denies painful eye redness, joint swelling or pain or other skin lesions. Her appetite has been good and her weight stable. She denies chest pain or dyspnea. She has had chronic palpitations for which she takes atenolol, and there's been an increase in these over the last with the acute illness.  ROS: She has noticed no adenopathy Psychiatric:  Chronic stable depression  All other systems are negative except as noted above in the HPI  Past Medical  History History reviewed. No pertinent past medical history.  Past Surgical History Past Surgical History:  Procedure Laterality Date  . BREAST ENHANCEMENT SURGERY Bilateral    2003  . ovarirectomy      right for cyst    Family History Family History  Problem Relation Age of Onset  . Hyperlipidemia Father   . Hypertension Father   . Heart attack Father   . Diabetes Maternal Grandfather   . Heart attack Paternal Grandfather     Social History Social History   Social History  . Marital status: Single    Spouse name: N/A  . Number of children: N/A  . Years of education: N/A   Social History Main Topics  . Smoking status: Former Research scientist (life sciences)  . Smokeless tobacco: Never Used  . Alcohol use None  . Drug use: Unknown  . Sexual activity: No   Other Topics Concern  . None   Social History Narrative  . 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 Sulfa Antibiotics     Excessive yeast infection overload.  . Topamax [Topiramate]     Extreme taste changes.     Outpatient Meds Home medications from the H+P and/or nursing med reconciliation reviewed.  Inpatient med list reviewed  _____________________________________________________________________ Objective   Exam:  Current vital signs  Patient Vitals for the past 8 hrs:  BP Temp Temp src Pulse Resp SpO2 Height Weight  02/20/17 0541 (!) 97/51 98.3 F (36.8 C) Oral 88 18 96 % - -  02/20/17 0259 Marland Kitchen)  100/59 98.6 F (37 C) Oral 71 16 99 % 5\' 5"  (1.651 m) 137 lb 8 oz (62.4 kg)  02/20/17 0230 (!) 104/57 - - 92 16 98 % - -  02/20/17 0123 104/64 - - 80 16 98 % - -  02/20/17 0000 (!) 108/55 - - 97 18 95 % - -    Intake/Output Summary (Last 24 hours) at 02/20/17 0746 Last data filed at 02/20/17 0300  Gross per 24 hour  Intake              250 ml  Output                0 ml  Net              250 ml    Physical Exam:    General: this is a Well-appearing  patient in no acute distress  Eyes: sclera anicteric, no redness  ENT: oral mucosa moist without lesions, no cervical or supraclavicular lymphadenopathy, good dentition. There are petechiae on the hard palate and underside of her bottom lip  CV: RRR without murmur, S1/S2, no JVD,, no peripheral edema  Resp: clear to auscultation bilaterally, normal RR and effort noted  GI: soft, no tenderness, with active bowel sounds. No guarding or palpable organomegaly noted  Skin; warm and dry, no rash or jaundice noted. Diffuse petechiae on, face and trunk and extremities  Neuro: awake, alert and oriented x 3. Normal gross motor function and fluent speech.  Labs:   Recent Labs Lab 02/14/17 0914 02/19/17 2038 02/20/17 0238 02/20/17 0457  WBC 21.9* 23.2*  --  26.3*  HGB 13.0 9.2*  --  8.4*  HCT 37.9 27.1*  --  24.6*  PLT 341 <5* <5* <5*    Recent Labs Lab 02/19/17 2038 02/20/17 0457  NA 136 138  K 3.4* 3.5  CL 108 109  CO2 22 22  BUN 7 <5*  ALBUMIN 2.8*  --   ALKPHOS 42  --   ALT 9*  --   AST 10*  --   GLUCOSE 110* 148*    Recent Labs Lab 02/20/17 0238  INR 1.16   C. difficile negative  Radiologic studies:  CT abdomen and pelvis with contrast was personally reviewed. There is diffuse mucosal thickening and wall enhancement of the colon consistent with colitis.  @ASSESSMENTPLANBEGIN @ Impression:  Hematochezia Chronic diarrhea Colitis, suspect ulcerative colitis (infectious seems less likely given the long time course) Severe thrombocytopenia, suspected ITP possibly related to UC.   Plan:  She received 60 mg of prednisone last evening, and apparently will be on this daily for treatment of the ITP. Of course, this would also help ulcerative colitis. I would not perform a colonoscopy at this juncture because of the thrombocytopenia. I have ordered a stool pathogen panel just to be certain there is no acute bacterial colitis.  Hematology will apparently see this  patient today. I've spoken with the triad physician Jennifer Abbott to make sure we have a clear planned by midday. I understand the limitation of administering platelets with ITP, but I'm increasingly concerned about the volume of this patient's blood loss in the last 2 days. I've ordered a repeat CBC  For 11 AM. I believe we need to set a transfusion parameter for hemoglobin that should be no less than 7.5. Also, if she drops her hemoglobin that far by the next blood count, I think that platelet transfusion should be given despite its limitation. I would not wait until this  patient becomes clinically unstable from blood loss before transfusing platelets.  I have discontinued the Unasyn since we do not really have a bacterial infection at this point (even with rising leukocytosis), and it did not want her to develop C. difficile colitis.  We will follow her closely, and Dr. Silverio Decamp will assume the consult service tomorrow.  Total time 80 minutes, over half spent in record, imaging review and discussion with providers.  Thank you for the courtesy of this consult.  Please contact me with any questions or concerns.  Nelida Meuse III Pager: [phone] Mon-Fri 8a-5p [phone] after 5p, weekends, holidays

## 2017-02-21 LAB — CBC
HCT: 26 % — ABNORMAL LOW (ref 36.0–46.0)
Hemoglobin: 8.9 g/dL — ABNORMAL LOW (ref 12.0–15.0)
MCH: 30.1 pg (ref 26.0–34.0)
MCHC: 34.2 g/dL (ref 30.0–36.0)
MCV: 87.8 fL (ref 78.0–100.0)
PLATELETS: 59 10*3/uL — AB (ref 150–400)
RBC: 2.96 MIL/uL — ABNORMAL LOW (ref 3.87–5.11)
RDW: 13.7 % (ref 11.5–15.5)
WBC: 26.7 10*3/uL — AB (ref 4.0–10.5)

## 2017-02-21 LAB — PREPARE PLATELET PHERESIS: Unit division: 0

## 2017-02-21 LAB — GASTROINTESTINAL PANEL BY PCR, STOOL (REPLACES STOOL CULTURE)
ASTROVIRUS: NOT DETECTED
Adenovirus F40/41: NOT DETECTED
CAMPYLOBACTER SPECIES: NOT DETECTED
CYCLOSPORA CAYETANENSIS: NOT DETECTED
Cryptosporidium: NOT DETECTED
ENTAMOEBA HISTOLYTICA: NOT DETECTED
ENTEROTOXIGENIC E COLI (ETEC): NOT DETECTED
Enteroaggregative E coli (EAEC): NOT DETECTED
Enteropathogenic E coli (EPEC): NOT DETECTED
Giardia lamblia: NOT DETECTED
Norovirus GI/GII: NOT DETECTED
PLESIMONAS SHIGELLOIDES: NOT DETECTED
Rotavirus A: NOT DETECTED
SALMONELLA SPECIES: NOT DETECTED
SAPOVIRUS (I, II, IV, AND V): NOT DETECTED
SHIGA LIKE TOXIN PRODUCING E COLI (STEC): NOT DETECTED
SHIGELLA/ENTEROINVASIVE E COLI (EIEC): NOT DETECTED
VIBRIO SPECIES: NOT DETECTED
Vibrio cholerae: NOT DETECTED
Yersinia enterocolitica: NOT DETECTED

## 2017-02-21 LAB — PREPARE RBC (CROSSMATCH)

## 2017-02-21 LAB — BPAM PLATELET PHERESIS
Blood Product Expiration Date: 201807292359
ISSUE DATE / TIME: 201807292021
UNIT TYPE AND RH: 7300

## 2017-02-21 MED ORDER — SUMATRIPTAN SUCCINATE 50 MG PO TABS
50.0000 mg | ORAL_TABLET | ORAL | Status: DC | PRN
  Filled 2017-02-21: qty 1

## 2017-02-21 NOTE — Progress Notes (Signed)
Progress Note   Subjective  Chief Complaint: Hematochezia  This morning the patient is found in good spirits, she tells me she does not have any abdominal pain but does continue to feel tired. She has had a slow of her bowel movements overnight, with only 1 loose stool so far this morning after breakfast and has had a large decrease in her bleeding since yesterday. In fact, her stool was "mostly brown this morning instead of red", like it had been for the past week or so. She denies any new complaints.   Objective   Vital signs in last 24 hours: Temp:  [97.9 F (36.6 C)-98.7 F (37.1 C)] 98.7 F (37.1 C) (07/30 0730) Pulse Rate:  [70-100] 92 (07/30 0730) Resp:  [16] 16 (07/30 0730) BP: (93-111)/(51-81) 109/71 (07/30 0730) SpO2:  [95 %-100 %] 98 % (07/30 0730) Last BM Date: 02/20/17 General: Caucasian female in NAD Heart:  Regular rate and rhythm; no murmurs Lungs: Respirations even and unlabored, lungs CTA bilaterally Abdomen:  Soft, nontender and nondistended. Normal bowel sounds. Extremities:  Without edema. Neurologic:  Alert and oriented,  grossly normal neurologically. Psych:  Cooperative. Normal mood and affect.  Intake/Output from previous day: 07/29 0701 - 07/30 0700 In: 2997.5 [I.V.:2700; Blood:297.5] Out: -  Intake/Output this shift: Total I/O In: 335 [Blood:335] Out: -   Lab Results:  Recent Labs  02/19/17 2038 02/20/17 0238 02/20/17 0457 02/20/17 2022  WBC 23.2*  --  26.3* 31.7*  HGB 9.2*  --  8.4* 7.7*  HCT 27.1*  --  24.6* 22.6*  PLT <5* <5* <5* 18*   BMET  Recent Labs  02/19/17 2038 02/20/17 0457  NA 136 138  K 3.4* 3.5  CL 108 109  CO2 22 22  GLUCOSE 110* 148*  BUN 7 <5*  CREATININE 0.65 0.60  CALCIUM 7.6* 8.0*   LFT  Recent Labs  02/19/17 2038  PROT 5.9*  ALBUMIN 2.8*  AST 10*  ALT 9*  ALKPHOS 42  BILITOT 0.2*   PT/INR  Recent Labs  02/19/17 2038 02/20/17 0238  LABPROT 14.3 14.9  INR 1.11 1.16     Studies/Results: Ct Abdomen Pelvis W Contrast  Result Date: 02/19/2017 CLINICAL DATA:  Bright red blood per rectum.  Leukocytosis. EXAM: CT ABDOMEN AND PELVIS WITH CONTRAST TECHNIQUE: Multidetector CT imaging of the abdomen and pelvis was performed using the standard protocol following bolus administration of intravenous contrast. CONTRAST:  120mL ISOVUE-300 IOPAMIDOL (ISOVUE-300) INJECTION 61% COMPARISON:  None. FINDINGS: Lower chest: No acute abnormality. Hepatobiliary: No focal liver abnormality is seen. No gallstones, gallbladder wall thickening, or biliary dilatation. Pancreas: Unremarkable. No pancreatic ductal dilatation or surrounding inflammatory changes. Spleen: Normal in size without focal abnormality. Adrenals/Urinary Tract: Adrenal glands are unremarkable. Kidneys are normal, without renal calculi, focal lesion, or hydronephrosis. Bladder is unremarkable. Stomach/Bowel: Stomach and small bowel are normal. Appendix is normal. Enteric contrast has reached the sigmoid colon, where it is diluted by intraluminal fluid. There is mural thickening and edema throughout the colon, suggesting colitis. No bowel obstruction. No perforation. Vascular/Lymphatic: No significant vascular findings are present. No enlarged abdominal or pelvic lymph nodes. Reproductive: Uterus and bilateral adnexa are unremarkable. Other: Small volume ascites. Musculoskeletal: No significant skeletal abnormality. Incidental vertebral hemangioma at T12. IMPRESSION: Mural thickening and edema throughout the colon, likely colitis. No bowel obstruction or perforation. Small volume ascites. Electronically Signed   By: Andreas Newport M.D.   On: 02/19/2017 22:42     Assessment / Plan:  Assessment: 1. Hematochezia 2. Chronic diarrhea 3. Colitis, suspect ulcerative colitis 4. Severe thrombocytopenia, suspect ITP possibly related to UC  Plan: 1. Continue Prednisone 60 mg by mouth daily, this was started by hematology for ITP  and in turn helps with suspected ulcerative colitis 2. Again we will hold on colonoscopy due to thrombocytopenia, but if this continues to improve outpatient we can certainly arrange for colonoscopy in the near future 3. Stool pathogen panel is still pending 4. Please await any further recommendations Dr. Silverio Decamp later today  Thank you for your kind consultation, we will continue to follow along   LOS: 1 day   Levin Erp  02/21/2017, 10:05 AM  Pager # [phone]   Attending physician's note   I have taken an interval history, reviewed the chart and examined the patient. I agree with the Advanced Practitioner's note, impression and recommendations.  No further bleeding, hematochezia has stopped. Platelet improved to 59K. On Prednisone and IVIG. Will hold off on colonoscopy until platelet have recovered to low 100's and stable. Will arrange for follow up as outpatient in next few days.  Follow up GI pathogen panel.  Diffuse colonic thickening on CT otherwise no other acute abnormality.   Damaris Hippo, MD [phone] Mon-Fri 8a-5p [phone] after 5p, weekends, holidays

## 2017-02-21 NOTE — Progress Notes (Signed)
IP PROGRESS NOTE  Subjective:   Jennifer Abbott reports feeling well this morning without any major complaints. Her hematochezia has improved overnight. She tolerated IVIG without any incident. She reports her petechiae or rash has improved as well. No fevers or abdominal pain noted.  Objective:  Vital signs in last 24 hours: Temp:  [97.9 F (36.6 C)-98.7 F (37.1 C)] 98.7 F (37.1 C) (07/30 0730) Pulse Rate:  [70-100] 92 (07/30 0730) Resp:  [16] 16 (07/30 0730) BP: (93-111)/(51-81) 109/71 (07/30 0730) SpO2:  [95 %-100 %] 98 % (07/30 0730) Weight change:  Last BM Date: 02/20/17  Intake/Output from previous day: 07/29 0701 - 07/30 0700 In: 2997.5 [I.V.:2700; Blood:297.5] Out: -  Alert, awake woman without distress. Mouth: mucous membranes moist, pharynx normal without lesions Resp: clear to auscultation bilaterally Cardio: regular rate and rhythm, S1, S2 normal, no murmur, click, rub or gallop GI: soft, non-tender; bowel sounds normal; no masses,  no organomegaly Extremities: extremities normal, atraumatic, no cyanosis or edema  Portacath/PICC-without erythema  Lab Results:  Recent Labs  02/20/17 0457 02/20/17 2022  WBC 26.3* 31.7*  HGB 8.4* 7.7*  HCT 24.6* 22.6*  PLT <5* 18*    BMET  Recent Labs  02/19/17 2038 02/20/17 0457  NA 136 138  K 3.4* 3.5  CL 108 109  CO2 22 22  GLUCOSE 110* 148*  BUN 7 <5*  CREATININE 0.65 0.60  CALCIUM 7.6* 8.0*      Medications: I have reviewed the patient's current medications.  Assessment/Plan:  36 year old woman with the following issues:  1. ITP: Appears to be related to ulcerative colitis presented with some cytopenia and hematochezia. Her platelet count after 1 dose of prednisone and IVIG was up to 18 with clinical improvement in her hematochezia. She did receive platelet transfusion but that was after her CBC was obtained.  Plan is to continue with oral prednisone 60 mg daily and she will receive another IVIG dose  today. If her platelet counts continue to improve on this trajectory, I have no objections to discharge if she is stable in the next 24 hours.  2. Anemia: Related to blood loss. I would check her iron studies and start oral iron upon discharge.  3. Colitis: She is followed by gastroenterology. She will require colonoscopy once her platelets normalize.  4. Follow-up: We'll arrange for follow-up for her upon discharge for prednisone management and taper.   LOS: 1 day   SHADAD,FIRAS 02/21/2017, 9:48 AM

## 2017-02-21 NOTE — Progress Notes (Signed)
PROGRESS NOTE  NELISSA BOLDUC BTD:[id number] DOB: 09-20-1980 DOA: 02/19/2017 PCP: [name], PA-C   LOS: 1 day   Brief Narrative / Interim history: 36 year old woman without significant medical history has been having progressive anemia and blood in her stool over the last 2 months. She also been having intermittent abdominal cramping for several months as well. She was also found to be progressively thrombocytopenic.  Assessment & Plan: Principal Problem:   Acute ITP (HCC) Active Problems:   Hematochezia   Acute blood loss anemia   Thrombocytopenia (HCC)   Colitis   Leukocytosis   Colitis -Gastroenterology suspects ulcerative colitis less likely infectious given longtime course -Antibiotics are discontinued -Will need outpatient follow-up with GI for colonoscopy  Thrombocytopenia -Likely due to ITP possibly related ulcerative colitis, continue prednisone, continue IVIG per oncology, to the last dose of IVIG -Received platelets as well as blood yesterday, hemoglobin and platelets are improving.  Bleeding is slowing down  GI bleed -Bleeding is improving  Acute blood loss anemia -Required 1 unit of packed red blood cells overnight   DVT prophylaxis: SCD Code Status: Full code Family Communication: no family at bedside Disposition Plan: home when ready, hopefully in 1 day  Consultants:   Gastroenterology  Oncology  Procedures:   none  Antimicrobials:  none   Subjective: - no chest pain, shortness of breath, no abdominal pain, nausea or vomiting.   Objective: Vitals:   02/21/17 0730 02/21/17 1036 02/21/17 1057 02/21/17 1109  BP: 109/71 101/65 103/67 110/71  Pulse: 92 98 90 89  Resp: 16  16 16   Temp: 98.7 F (37.1 C) 99.2 F (37.3 C) 98.7 F (37.1 C) 98.8 F (37.1 C)  TempSrc: Oral Oral Oral Oral  SpO2: 98% 95% 98% 98%  Weight:      Height:        Intake/Output Summary (Last 24 hours) at 02/21/17 1343 Last data filed at 02/21/17 0730  Gross per 24 hour  Intake          2500.83 ml  Output                0 ml  Net          2500.83 ml   Filed Weights   02/19/17 2001 02/20/17 0259  Weight: 60.3 kg (133 lb) 62.4 kg (137 lb 8 oz)    Examination:  Vitals:   02/21/17 0730 02/21/17 1036 02/21/17 1057 02/21/17 1109  BP: 109/71 101/65 103/67 110/71  Pulse: 92 98 90 89  Resp: 16  16 16   Temp: 98.7 F (37.1 C) 99.2 F (37.3 C) 98.7 F (37.1 C) 98.8 F (37.1 C)  TempSrc: Oral Oral Oral Oral  SpO2: 98% 95% 98% 98%  Weight:      Height:        Constitutional: NAD Eyes: lids and conjunctivae normal Respiratory: clear to auscultation bilaterally, no wheezing, no crackles. Normal respiratory effort. No accessory muscle use.  Cardiovascular: Regular rate and rhythm, no murmurs / rubs / gallops. No LE edema.  Occasional extra beats Abdomen: no tenderness.  Neurologic: Nonfocal Psychiatric: Normal judgment and insight. Alert and oriented x 3. Normal mood.    Data Reviewed: I have independently reviewed following labs and imaging studies   CBC:  Recent Labs Lab 02/19/17 2038 02/20/17 0238 02/20/17 0457 02/20/17 2022 02/21/17 1027  WBC 23.2*  --  26.3* 31.7* 26.7*  NEUTROABS 16.0*  --   --   --   --   HGB 9.2*  --  8.4* 7.7* 8.9*  HCT 27.1*  --  24.6* 22.6* 26.0*  MCV 85.8  --  86.9 87.9 87.8  PLT <5* <5* <5* 18* 59*   Basic Metabolic Panel:  Recent Labs Lab 02/19/17 2038 02/20/17 0457  NA 136 138  K 3.4* 3.5  CL 108 109  CO2 22 22  GLUCOSE 110* 148*  BUN 7 <5*  CREATININE 0.65 0.60  CALCIUM 7.6* 8.0*   GFR: Estimated Creatinine Clearance: 85.8 mL/min (by C-G formula based on SCr of 0.6 mg/dL). Liver Function Tests:  Recent Labs Lab 02/19/17 2038  AST 10*  ALT 9*  ALKPHOS 42  BILITOT 0.2*  PROT 5.9*  ALBUMIN 2.8*   No results for input(s): LIPASE, AMYLASE in the last 168 hours. No results for input(s): AMMONIA in the last 168 hours. Coagulation Profile:  Recent Labs Lab  02/19/17 2038 02/20/17 0238  INR 1.11 1.16   Cardiac Enzymes: No results for input(s): CKTOTAL, CKMB, CKMBINDEX, TROPONINI in the last 168 hours. BNP (last 3 results) No results for input(s): PROBNP in the last 8760 hours. HbA1C: No results for input(s): HGBA1C in the last 72 hours. CBG: No results for input(s): GLUCAP in the last 168 hours. Lipid Profile: No results for input(s): CHOL, HDL, LDLCALC, TRIG, CHOLHDL, LDLDIRECT in the last 72 hours. Thyroid Function Tests: No results for input(s): TSH, T4TOTAL, FREET4, T3FREE, THYROIDAB in the last 72 hours. Anemia Panel: No results for input(s): VITAMINB12, FOLATE, FERRITIN, TIBC, IRON, RETICCTPCT in the last 72 hours. Urine analysis:    Component Value Date/Time   COLORURINE CANCELED 02/14/2017 0914   APPEARANCEUR CANCELED 02/14/2017 0914   LABSPEC CANCELED 02/14/2017 0914   PHURINE CANCELED 02/14/2017 0914   GLUCOSEU CANCELED 02/14/2017 0914   HGBUR CANCELED 02/14/2017 0914   BILIRUBINUR CANCELED 02/14/2017 0914   BILIRUBINUR neg 12/29/2012 1654   KETONESUR CANCELED 02/14/2017 0914   PROTEINUR CANCELED 02/14/2017 0914   UROBILINOGEN 0.2 12/29/2012 1654   NITRITE CANCELED 02/14/2017 0914   LEUKOCYTESUR CANCELED 02/14/2017 0914   Sepsis Labs: Invalid input(s): PROCALCITONIN, LACTICIDVEN  Recent Results (from the past 240 hour(s))  Urine Culture     Status: None   Collection Time: 02/14/17  9:14 AM  Result Value Ref Range Status   Organism ID, Bacteria NO GROWTH  Final  Stool culture     Status: None   Collection Time: 02/16/17  7:52 AM  Result Value Ref Range Status   Organism ID, Bacteria NO SALMONELLA OR SHIGELLA ISOLATED  Final    Comment: NO ENTERIC CAMPYLOBACTER ISOLATED NO ESCHERICHIA COLI 0157 ISOLATED       Radiology Studies: Ct Abdomen Pelvis W Contrast  Result Date: 02/19/2017 CLINICAL DATA:  Bright red blood per rectum.  Leukocytosis. EXAM: CT ABDOMEN AND PELVIS WITH CONTRAST TECHNIQUE: Multidetector  CT imaging of the abdomen and pelvis was performed using the standard protocol following bolus administration of intravenous contrast. CONTRAST:  120mL ISOVUE-300 IOPAMIDOL (ISOVUE-300) INJECTION 61% COMPARISON:  None. FINDINGS: Lower chest: No acute abnormality. Hepatobiliary: No focal liver abnormality is seen. No gallstones, gallbladder wall thickening, or biliary dilatation. Pancreas: Unremarkable. No pancreatic ductal dilatation or surrounding inflammatory changes. Spleen: Normal in size without focal abnormality. Adrenals/Urinary Tract: Adrenal glands are unremarkable. Kidneys are normal, without renal calculi, focal lesion, or hydronephrosis. Bladder is unremarkable. Stomach/Bowel: Stomach and small bowel are normal. Appendix is normal. Enteric contrast has reached the sigmoid colon, where it is diluted by intraluminal fluid. There is mural thickening and edema throughout the colon, suggesting colitis.  No bowel obstruction. No perforation. Vascular/Lymphatic: No significant vascular findings are present. No enlarged abdominal or pelvic lymph nodes. Reproductive: Uterus and bilateral adnexa are unremarkable. Other: Small volume ascites. Musculoskeletal: No significant skeletal abnormality. Incidental vertebral hemangioma at T12. IMPRESSION: Mural thickening and edema throughout the colon, likely colitis. No bowel obstruction or perforation. Small volume ascites. Electronically Signed   By: Andreas Newport M.D.   On: 02/19/2017 22:42     Scheduled Meds: . atenolol  12.5 mg Oral Daily  . citalopram  20 mg Oral Daily  . predniSONE  60 mg Oral Q breakfast  . prenatal vitamin w/FE, FA  1 tablet Oral Daily   Continuous Infusions: . sodium chloride 100 mL/hr at 02/21/17 0751     Marzetta Board, MD, PhD [facility] Pager [phone]  If 7PM-7AM, please contact night-coverage [email or web address] Password [email or web address] 02/21/2017, 1:43 PM

## 2017-02-21 NOTE — Progress Notes (Signed)
Pt transfusion of PRBC awaiting new IV from IV team. IV attempted by 2 nurses and unsuccessful.  Conception Oms

## 2017-02-22 ENCOUNTER — Other Ambulatory Visit: Payer: Self-pay | Admitting: Oncology

## 2017-02-22 ENCOUNTER — Other Ambulatory Visit: Payer: Self-pay

## 2017-02-22 ENCOUNTER — Telehealth: Payer: Self-pay

## 2017-02-22 DIAGNOSIS — D509 Iron deficiency anemia, unspecified: Secondary | ICD-10-CM

## 2017-02-22 DIAGNOSIS — D649 Anemia, unspecified: Secondary | ICD-10-CM

## 2017-02-22 DIAGNOSIS — D62 Acute posthemorrhagic anemia: Secondary | ICD-10-CM

## 2017-02-22 LAB — CBC
HEMATOCRIT: 23.8 % — AB (ref 36.0–46.0)
HEMOGLOBIN: 8.1 g/dL — AB (ref 12.0–15.0)
MCH: 30.1 pg (ref 26.0–34.0)
MCHC: 34 g/dL (ref 30.0–36.0)
MCV: 88.5 fL (ref 78.0–100.0)
Platelets: 106 10*3/uL — ABNORMAL LOW (ref 150–400)
RBC: 2.69 MIL/uL — ABNORMAL LOW (ref 3.87–5.11)
RDW: 14.4 % (ref 11.5–15.5)
WBC: 23.4 10*3/uL — ABNORMAL HIGH (ref 4.0–10.5)

## 2017-02-22 LAB — BASIC METABOLIC PANEL
Anion gap: 4 — ABNORMAL LOW (ref 5–15)
BUN: 11 mg/dL (ref 6–20)
CALCIUM: 7.7 mg/dL — AB (ref 8.9–10.3)
CHLORIDE: 112 mmol/L — AB (ref 101–111)
CO2: 24 mmol/L (ref 22–32)
CREATININE: 0.63 mg/dL (ref 0.44–1.00)
GFR calc Af Amer: >60 mL/min (ref >60)
GFR calc non Af Amer: >60 mL/min (ref >60)
GLUCOSE: 88 mg/dL (ref 65–99)
Potassium: 3.2 mmol/L — ABNORMAL LOW (ref 3.5–5.1)
SODIUM: 140 mmol/L (ref 135–145)

## 2017-02-22 LAB — TYPE AND SCREEN
ABO/RH(D): A POS
Antibody Screen: NEGATIVE
UNIT DIVISION: 0

## 2017-02-22 LAB — BPAM RBC
BLOOD PRODUCT EXPIRATION DATE: 201808102359
ISSUE DATE / TIME: 201807300500
Unit Type and Rh: 6200

## 2017-02-22 MED ORDER — POTASSIUM CHLORIDE CRYS ER 20 MEQ PO TBCR
40.0000 meq | EXTENDED_RELEASE_TABLET | Freq: Once | ORAL | Status: AC
  Administered 2017-02-22: 40 meq via ORAL
  Filled 2017-02-22: qty 2

## 2017-02-22 MED ORDER — PREDNISONE 20 MG PO TABS: 60.0000 mg | ORAL_TABLET | Freq: Every day | ORAL | 2 refills | Status: DC

## 2017-02-22 MED ORDER — FERROUS SULFATE 325 (65 FE) MG PO TABS: 325.0000 mg | ORAL_TABLET | Freq: Every day | ORAL | 3 refills | Status: DC

## 2017-02-22 MED FILL — predniSONE 20 MG TABS: 20 | 10 days supply | Qty: 30 | Fill #0

## 2017-02-22 MED FILL — FERROUS SULFATE 325 MG TAB: 325 (65 FE) | 100 days supply | Qty: 100 | Fill #0

## 2017-02-22 NOTE — Telephone Encounter (Signed)
-----   Message from [place], [place] sent at 02/22/2017 12:13 PM EDT ----- Regarding: Needs OV early next week Needs OV with anyone available early next week-Danis pt- or app-follow up hospital visit for hematochezia- needs repeat cbc and arranged for colonoscopy at that appointment. Thanks-JLL

## 2017-02-22 NOTE — Discharge Summary (Signed)
Physician Discharge Summary  Jennifer Abbott EPP:[id number] DOB: 12/26/1980 DOA: 02/19/2017  PCP: [name], PA-C  Admit date: 02/19/2017 Discharge date: 02/22/2017  Admitted From: Home Disposition: Home  Recommendations for Outpatient Follow-up:  1. Follow up with Dr. Alen Blew and Dr. Loletha Carrow as scheduled 2. Patient placed on prednisone 60 mg daily on discharge up until seeing oncology, and will be tapered off if her platelets have normalized  Home Health: none Equipment/Devices: none  Discharge Condition: stable CODE STATUS: Full code Diet recommendation: regular  HPI: Per Dr. Marthenia Rolling is a 36 y.o. female with medical history significant of previously healthy.  She states over last 2 months she's had some intermittent lower abdominal cramping with some loose stools. This usually happens in the morning and then she is fine for the rest of the day.  She states over the last week or so she's had some intermittent blood-streaked with her stools.  Over the last 3-4 days she's had a blood with clots.  She saw her physician on the 23rd who did some blood work including stool studies which came back negative on culture and C. difficile testing, blood work at that time showed WBC 21k, 4.5k eos, nl platelets.  She was started on Cipro and Augmentin.  She's taking it for the last 3 days but has only taken 1 dose per day.  Today she had repeat blood work which showed markedly low platelet count at 1000 and markedly elevated WBCs at 30,000.  She also noticed today that she started having petechiae in her mouth and her extremities.  She denies any other new medications.  She has not been on any recent antibiotics other than the Cipro and Augmentin that she started radiating to go.  She denies any fevers. She's had a little bit of nausea but no vomiting. No urinary symptoms or hematuria. ED Course: CT scan abd pelvis shows colitis, HGB is 9.3 down from 13 x6 days ago.  Platelets are <5 down  from 341 x6 days ago.  Diff shows 5k eos.  Patient has absolutely normal mental status, and BUN and creat are also NL.  No fever.  Vitals are baseline for this patient (she says her BP usually runs low 100s/60s, and she has h/o mild tachycardia w/ PVCs that she takes atenolol for).  Hospital Course: Discharge Diagnoses:  Principal Problem:   Acute ITP ([place]) Active Problems:   Hematochezia   Acute blood loss anemia   Thrombocytopenia (H[facility])   Colitis   Leukocytosis  Patient was admitted to the hospital with significant anemia and thrombocytopenia.  Oncology was consulted.  Given colitis and progressive GI symptoms over the last several months as well as right red blood in her stool, gastroenterology was consulted as well.  It is believed that she may have underlying ulcerative colitis given clinical picture, and this can be complicated by ITP.  Patient receives IVIG 2 doses, received prednisone unit of platelets and a unit of blood.  Her bleeding has stopped, her platelets have started to improve and went from being less than 5 on admission to 106 on discharge.  She was evaluated by oncology on the day of discharge, she will be sent home with 60 mg of prednisone daily up until she will see Dr. Alen Blew next week in clinic.  If her platelets were been normalized by then, she will probably need a taper.  Once thrombocytopenia will be resolved, gastroenterology will plan to do a diagnostic colonoscopy as  an outpatient.   Discharge Instructions  Discharge Instructions    AMB Referral to [place] Management    Complete by:  As directed    Please assign UMR member for post discharge call. Currently at [facility] and is discharging today 02/22/17. Thanks Marthenia Rolling, [place], [facility] Liaison-[phone]   Reason for consult:  Please assign UMR member for post discharge call   Expected date of contact:  1-3 days (reserved for hospital discharges)     Allergies as of  02/22/2017      Reactions   Sulfa Antibiotics    Excessive yeast infection overload.   Topamax [topiramate]    Extreme taste changes.       Medication List    STOP taking these medications   amoxicillin-clavulanate 875-125 MG tablet Commonly known as:  AUGMENTIN   ciprofloxacin 750 MG tablet Commonly known as:  CIPRO     TAKE these medications   atenolol 25 MG tablet Commonly known as:  TENORMIN Take 1.5 tablets (37.5 mg total) by mouth daily.   citalopram 40 MG tablet Commonly known as:  CELEXA Take 0.5 tablets (20 mg total) by mouth daily.   drospirenone-ethinyl estradiol 3-0.02 MG tablet Commonly known as:  YAZ,GIANVI,LORYNA Take 1 tablet by mouth daily. Please fill Gianvi only. Skip placebos.   ferrous sulfate 325 (65 FE) MG tablet Take 1 tablet (325 mg total) by mouth daily.   ondansetron 8 MG disintegrating tablet Commonly known as:  ZOFRAN-ODT Take 8 mg by mouth every 6 (six) hours as needed for nausea or vomiting.   predniSONE 20 MG tablet Commonly known as:  DELTASONE Take 3 tablets (60 mg total) by mouth daily with breakfast.   PRENATAL VITAMIN PLUS LOW IRON 27-1 MG Tabs Take 1 tablet by mouth daily.   rizatriptan 5 MG disintegrating tablet Commonly known as:  MAXALT-MLT Take 1 tablet (5 mg total) by mouth as needed for migraine. May repeat in 2 hours if needed   Topiramate ER 25 MG Cp24 Commonly known as:  TROKENDI XR Take 1 tablet by mouth daily. What changed:  how much to take   tretinoin 0.1 % cream Commonly known as:  RETIN-A Apply topically at bedtime.      Follow-up Information    Wyatt Portela, MD Follow up.   Specialty:  Oncology Why:  as scheduled Contact information: [address] [place] [address] [phone]          Allergies  Allergen Reactions  . Sulfa Antibiotics     Excessive yeast infection overload.  . Topamax [Topiramate]     Extreme taste changes.      Consultations:  Gastroenterology  Oncology  Procedures/Studies:  Ct Abdomen Pelvis W Contrast  Result Date: 02/19/2017 CLINICAL DATA:  Bright red blood per rectum.  Leukocytosis. EXAM: CT ABDOMEN AND PELVIS WITH CONTRAST TECHNIQUE: Multidetector CT imaging of the abdomen and pelvis was performed using the standard protocol following bolus administration of intravenous contrast. CONTRAST:  158mL ISOVUE-300 IOPAMIDOL (ISOVUE-300) INJECTION 61% COMPARISON:  None. FINDINGS: Lower chest: No acute abnormality. Hepatobiliary: No focal liver abnormality is seen. No gallstones, gallbladder wall thickening, or biliary dilatation. Pancreas: Unremarkable. No pancreatic ductal dilatation or surrounding inflammatory changes. Spleen: Normal in size without focal abnormality. Adrenals/Urinary Tract: Adrenal glands are unremarkable. Kidneys are normal, without renal calculi, focal lesion, or hydronephrosis. Bladder is unremarkable. Stomach/Bowel: Stomach and small bowel are normal. Appendix is normal. Enteric contrast has reached the sigmoid colon, where it is  diluted by intraluminal fluid. There is mural thickening and edema throughout the colon, suggesting colitis. No bowel obstruction. No perforation. Vascular/Lymphatic: No significant vascular findings are present. No enlarged abdominal or pelvic lymph nodes. Reproductive: Uterus and bilateral adnexa are unremarkable. Other: Small volume ascites. Musculoskeletal: No significant skeletal abnormality. Incidental vertebral hemangioma at T12. IMPRESSION: Mural thickening and edema throughout the colon, likely colitis. No bowel obstruction or perforation. Small volume ascites. Electronically Signed   By: Andreas Newport M.D.   On: 02/19/2017 22:42   Dg Abd 2 Views  Result Date: 02/14/2017 CLINICAL DATA:  Abdomen pain with bloating EXAM: ABDOMEN - 2 VIEW COMPARISON:  None. FINDINGS: Lung bases are clear. No free air beneath the diaphragm. Nonobstructed bowel-gas  pattern with mild stool in the colon. No abnormal calcifications. IMPRESSION: Nonobstructed bowel-gas pattern Electronically Signed   By: Donavan Foil M.D.   On: 02/14/2017 19:32     Subjective: - no chest pain, shortness of breath, no abdominal pain, nausea or vomiting.   Discharge Exam: Vitals:   02/22/17 0000 02/22/17 0608  BP: (!) 109/59 106/71  Pulse: 82 91  Resp: 16 12  Temp: 98.7 F (37.1 C) 98.7 F (37.1 C)   Vitals:   02/21/17 1109 02/21/17 1215 02/22/17 0000 02/22/17 0608  BP: 110/71 108/66 (!) 109/59 106/71  Pulse: 89 86 82 91  Resp: 16 16 16 12   Temp: 98.8 F (37.1 C) 98.2 F (36.8 C) 98.7 F (37.1 C) 98.7 F (37.1 C)  TempSrc: Oral Oral Oral Oral  SpO2: 98% 98% 98% 98%  Weight:      Height:        General: Pt is alert, awake, not in acute distress   The results of significant diagnostics from this hospitalization (including imaging, microbiology, ancillary and laboratory) are listed below for reference.     Microbiology: Recent Results (from the past 240 hour(s))  Urine Culture     Status: None   Collection Time: 02/14/17  9:14 AM  Result Value Ref Range Status   Organism ID, Bacteria NO GROWTH  Final  Stool culture     Status: None   Collection Time: 02/16/17  7:52 AM  Result Value Ref Range Status   Organism ID, Bacteria NO SALMONELLA OR SHIGELLA ISOLATED  Final    Comment: NO ENTERIC CAMPYLOBACTER ISOLATED NO ESCHERICHIA COLI 0157 ISOLATED   Gastrointestinal Panel by PCR , Stool     Status: None   Collection Time: 02/20/17 10:15 AM  Result Value Ref Range Status   Campylobacter species NOT DETECTED NOT DETECTED Final   Plesimonas shigelloides NOT DETECTED NOT DETECTED Final   Salmonella species NOT DETECTED NOT DETECTED Final   Yersinia enterocolitica NOT DETECTED NOT DETECTED Final   Vibrio species NOT DETECTED NOT DETECTED Final   Vibrio cholerae NOT DETECTED NOT DETECTED Final   Enteroaggregative E coli (EAEC) NOT DETECTED NOT DETECTED  Final   Enteropathogenic E coli (EPEC) NOT DETECTED NOT DETECTED Final   Enterotoxigenic E coli (ETEC) NOT DETECTED NOT DETECTED Final   Shiga like toxin producing E coli (STEC) NOT DETECTED NOT DETECTED Final   Shigella/Enteroinvasive E coli (EIEC) NOT DETECTED NOT DETECTED Final   Cryptosporidium NOT DETECTED NOT DETECTED Final   Cyclospora cayetanensis NOT DETECTED NOT DETECTED Final   Entamoeba histolytica NOT DETECTED NOT DETECTED Final   Giardia lamblia NOT DETECTED NOT DETECTED Final   Adenovirus F40/41 NOT DETECTED NOT DETECTED Final   Astrovirus NOT DETECTED NOT DETECTED Final   Norovirus  GI/GII NOT DETECTED NOT DETECTED Final   Rotavirus A NOT DETECTED NOT DETECTED Final   Sapovirus (I, II, IV, and V) NOT DETECTED NOT DETECTED Final     Labs: BNP (last 3 results) No results for input(s): BNP in the last 8760 hours. Basic Metabolic Panel:  Recent Labs Lab 02/19/17 2038 02/20/17 0457 02/22/17 0454  NA 136 138 140  K 3.4* 3.5 3.2*  CL 108 109 112*  CO2 22 22 24   GLUCOSE 110* 148* 88  BUN 7 <5* 11  CREATININE 0.65 0.60 0.63  CALCIUM 7.6* 8.0* 7.7*   Liver Function Tests:  Recent Labs Lab 02/19/17 2038  AST 10*  ALT 9*  ALKPHOS 42  BILITOT 0.2*  PROT 5.9*  ALBUMIN 2.8*   No results for input(s): LIPASE, AMYLASE in the last 168 hours. No results for input(s): AMMONIA in the last 168 hours. CBC:  Recent Labs Lab 02/19/17 2038 02/20/17 0238 02/20/17 0457 02/20/17 2022 02/21/17 1027 02/22/17 0454  WBC 23.2*  --  26.3* 31.7* 26.7* 23.4*  NEUTROABS 16.0*  --   --   --   --   --   HGB 9.2*  --  8.4* 7.7* 8.9* 8.1*  HCT 27.1*  --  24.6* 22.6* 26.0* 23.8*  MCV 85.8  --  86.9 87.9 87.8 88.5  PLT <5* <5* <5* 18* 59* 106*   Cardiac Enzymes: No results for input(s): CKTOTAL, CKMB, CKMBINDEX, TROPONINI in the last 168 hours. BNP: Invalid input(s): POCBNP CBG: No results for input(s): GLUCAP in the last 168 hours. D-Dimer  Recent Labs  02/20/17 0238   DDIMER 2.61*   Hgb A1c No results for input(s): HGBA1C in the last 72 hours. Lipid Profile No results for input(s): CHOL, HDL, LDLCALC, TRIG, CHOLHDL, LDLDIRECT in the last 72 hours. Thyroid function studies No results for input(s): TSH, T4TOTAL, T3FREE, THYROIDAB in the last 72 hours.  Invalid input(s): FREET3 Anemia work up No results for input(s): VITAMINB12, FOLATE, FERRITIN, TIBC, IRON, RETICCTPCT in the last 72 hours. Urinalysis    Component Value Date/Time   COLORURINE CANCELED 02/14/2017 0914   APPEARANCEUR CANCELED 02/14/2017 0914   LABSPEC CANCELED 02/14/2017 0914   PHURINE CANCELED 02/14/2017 0914   GLUCOSEU CANCELED 02/14/2017 0914   HGBUR CANCELED 02/14/2017 0914   BILIRUBINUR CANCELED 02/14/2017 0914   BILIRUBINUR neg 12/29/2012 1654   KETONESUR CANCELED 02/14/2017 0914   PROTEINUR CANCELED 02/14/2017 0914   UROBILINOGEN 0.2 12/29/2012 1654   NITRITE CANCELED 02/14/2017 0914   LEUKOCYTESUR CANCELED 02/14/2017 0914   Sepsis Labs Invalid input(s): PROCALCITONIN,  WBC,  LACTICIDVEN Microbiology Recent Results (from the past 240 hour(s))  Urine Culture     Status: None   Collection Time: 02/14/17  9:14 AM  Result Value Ref Range Status   Organism ID, Bacteria NO GROWTH  Final  Stool culture     Status: None   Collection Time: 02/16/17  7:52 AM  Result Value Ref Range Status   Organism ID, Bacteria NO SALMONELLA OR SHIGELLA ISOLATED  Final    Comment: NO ENTERIC CAMPYLOBACTER ISOLATED NO ESCHERICHIA COLI 0157 ISOLATED   Gastrointestinal Panel by PCR , Stool     Status: None   Collection Time: 02/20/17 10:15 AM  Result Value Ref Range Status   Campylobacter species NOT DETECTED NOT DETECTED Final   Plesimonas shigelloides NOT DETECTED NOT DETECTED Final   Salmonella species NOT DETECTED NOT DETECTED Final   Yersinia enterocolitica NOT DETECTED NOT DETECTED Final   Vibrio species NOT DETECTED  NOT DETECTED Final   Vibrio cholerae NOT DETECTED NOT DETECTED  Final   Enteroaggregative E coli (EAEC) NOT DETECTED NOT DETECTED Final   Enteropathogenic E coli (EPEC) NOT DETECTED NOT DETECTED Final   Enterotoxigenic E coli (ETEC) NOT DETECTED NOT DETECTED Final   Shiga like toxin producing E coli (STEC) NOT DETECTED NOT DETECTED Final   Shigella/Enteroinvasive E coli (EIEC) NOT DETECTED NOT DETECTED Final   Cryptosporidium NOT DETECTED NOT DETECTED Final   Cyclospora cayetanensis NOT DETECTED NOT DETECTED Final   Entamoeba histolytica NOT DETECTED NOT DETECTED Final   Giardia lamblia NOT DETECTED NOT DETECTED Final   Adenovirus F40/41 NOT DETECTED NOT DETECTED Final   Astrovirus NOT DETECTED NOT DETECTED Final   Norovirus GI/GII NOT DETECTED NOT DETECTED Final   Rotavirus A NOT DETECTED NOT DETECTED Final   Sapovirus (I, II, IV, and V) NOT DETECTED NOT DETECTED Final     Time coordinating discharge: 25 minutes  SIGNED:  Marzetta Board, MD  [facility] 02/22/2017, 5:33 PM Pager [phone]  If 7PM-7AM, please contact night-coverage [email or web address] Password [email or web address]

## 2017-02-22 NOTE — Progress Notes (Signed)
IP PROGRESS NOTE  Subjective:   No major issues reported overnight. She is reporting headache today although no neurological complaints. No hematochezia noted at this time.  Objective:  Vital signs in last 24 hours: Temp:  [98.2 F (36.8 C)-98.8 F (37.1 C)] 98.7 F (37.1 C) (07/31 0608) Pulse Rate:  [82-91] 91 (07/31 0608) Resp:  [12-16] 12 (07/31 0608) BP: (103-110)/(59-71) 106/71 (07/31 0608) SpO2:  [98 %] 98 % (07/31 2500) Weight change:  Last BM Date: 02/20/17  Intake/Output from previous day: 07/30 0701 - 07/31 0700 In: 4290.5 [P.O.:240; I.V.:3715.5; Blood:335] Out: -  Well-appearing woman without distress. Mouth: mucous membranes moist, pharynx normal without lesions Resp: clear to auscultation bilaterally Cardio: regular rate and rhythm, S1, S2 normal, no murmur, click, rub or gallop GI: soft, non-tender; bowel sounds normal; no masses,  no organomegaly Extremities: extremities normal, atraumatic, no cyanosis or edema Neurological deficits.   Lab Results:  Recent Labs  02/21/17 1027 02/22/17 0454  WBC 26.7* 23.4*  HGB 8.9* 8.1*  HCT 26.0* 23.8*  PLT 59* 106*    BMET  Recent Labs  02/20/17 0457 02/22/17 0454  NA 138 140  K 3.5 3.2*  CL 109 112*  CO2 22 24  GLUCOSE 148* 88  BUN <5* 11  CREATININE 0.60 0.63  CALCIUM 8.0* 7.7*      Medications: I have reviewed the patient's current medications.  Assessment/Plan:  36 year old woman with the following issues:  1. ITP: Appears to be related to ulcerative colitis presented with Thrombocytopenia and hematochezia. She is currently on prednisone 60 mg daily and status post 2 days of IVIG completed on 02/21/2017. Her platelet count has responded rather quickly with platelet count today of 106.  Plan is to continue prednisone 60 mg daily untill she has a normal platelet count and a slow taper will start after that.  2. Anemia: Related to blood loss. I recommended discharged on oral iron and I will  repeat iron studies as an outpatient.  3. Colitis: She is followed by gastroenterology. She will require colonoscopy once her platelets normalize.  4. Follow-up: I have no objections to discharge today on prednisone 60 mg daily. I will arrange follow-up within 7 days to recheck her platelet count. I anticipate follow-up on 02/28/2017 at 8:00 in the morning.   LOS: 2 days   [facility] 02/22/2017, 10:43 AM

## 2017-02-22 NOTE — Discharge Instructions (Signed)
Follow with [name], PA-C  As needed  Please get a complete blood count and chemistry panel checked by your Primary MD at your next visit, and again as instructed by your Primary MD. Please get your medications reviewed and adjusted by your Primary MD.  Please request your Primary MD to go over all Hospital Tests and Procedure/Radiological results at the follow up, please get all Hospital records sent to your Prim MD by signing hospital release before you go home.  If you had Pneumonia of Lung problems at the Hospital: Please get a 2 view Chest X ray done in 6-8 weeks after hospital discharge or sooner if instructed by your Primary MD.  If you have Congestive Heart Failure: Please call your Cardiologist or Primary MD anytime you have any of the following symptoms:  1) 3 pound weight gain in 24 hours or 5 pounds in 1 week  2) shortness of breath, with or without a dry hacking cough  3) swelling in the hands, feet or stomach  4) if you have to sleep on extra pillows at night in order to breathe  Follow cardiac low salt diet and 1.5 lit/day fluid restriction.  If you have diabetes Accuchecks 4 times/day, Once in AM empty stomach and then before each meal. Log in all results and show them to your primary doctor at your next visit. If any glucose reading is under 80 or above 300 call your primary MD immediately.  If you have Seizure/Convulsions/Epilepsy: Please do not drive, operate heavy machinery, participate in activities at heights or participate in high speed sports until you have seen by Primary MD or a Neurologist and advised to do so again.  If you had Gastrointestinal Bleeding: Please ask your Primary MD to check a complete blood count within one week of discharge or at your next visit. Your endoscopic/colonoscopic biopsies that are pending at the time of discharge, will also need to followed by your Primary MD.  Get Medicines reviewed and adjusted. Please take all your  medications with you for your next visit with your Primary MD  Please request your Primary MD to go over all hospital tests and procedure/radiological results at the follow up, please ask your Primary MD to get all Hospital records sent to his/her office.  If you experience worsening of your admission symptoms, develop shortness of breath, life threatening emergency, suicidal or homicidal thoughts you must seek medical attention immediately by calling 911 or calling your MD immediately  if symptoms less severe.  You must read complete instructions/literature along with all the possible adverse reactions/side effects for all the Medicines you take and that have been prescribed to you. Take any new Medicines after you have completely understood and accpet all the possible adverse reactions/side effects.   Do not drive or operate heavy machinery when taking Pain medications.   Do not take more than prescribed Pain, Sleep and Anxiety Medications  Special Instructions: If you have smoked or chewed Tobacco  in the last 2 yrs please stop smoking, stop any regular Alcohol  and or any Recreational drug use.  Wear Seat belts while driving.  Please note You were cared for by a hospitalist during your hospital stay. If you have any questions about your discharge medications or the care you received while you were in the hospital after you are discharged, you can call the unit and asked to speak with the hospitalist on call if the hospitalist that took care of you is not available.  Once you are discharged, your primary care physician will handle any further medical issues. Please note that NO REFILLS for any discharge medications will be authorized once you are discharged, as it is imperative that you return to your primary care physician (or establish a relationship with a primary care physician if you do not have one) for your aftercare needs so that they can reassess your need for medications and monitor your  lab values.  You can reach the hospitalist office at phone [phone] or fax [phone]   If you do not have a primary care physician, you can call [phone] for a physician referral.  Activity: As tolerated with Full fall precautions use walker/cane & assistance as needed  Diet: regular  Disposition Home

## 2017-02-22 NOTE — Consult Note (Signed)
   [facility] CM Inpatient Consult   02/22/2017  Jennifer Abbott [birth year] [id number]    Spoke with [name] at bedside on behalf of Link to [facility] Care Management program for [facility] employees/dependents with [facility] insurance.  Discussed Link to Wellness program. Denies having any Link to Wellness needs. Provided brochure, contact information, and 24- hour nurse magnet.   Confirmed best contact number for post discharge call at [phone].   Marthenia Rolling, MSN-Ed, RN,BSN [facility] Liaison [phone]

## 2017-02-22 NOTE — Telephone Encounter (Signed)
Pt scheduled to see Dr. Loletha Carrow 03/02/17@8 :30am. Order for CBC in epic and need for colon in appt notes.

## 2017-02-23 ENCOUNTER — Other Ambulatory Visit: Payer: Self-pay | Admitting: *Deleted

## 2017-02-23 ENCOUNTER — Telehealth: Payer: Self-pay

## 2017-02-23 NOTE — Telephone Encounter (Signed)
Unable to reach pt by phone after multiple attempts. Pt scheduled to see Dr. Loletha Carrow 03/02/17@8 :30am. Pt mailed appt letter and message left for pt .

## 2017-02-23 NOTE — Telephone Encounter (Signed)
-----   Message from [place], [place] sent at 02/22/2017  2:57 PM EDT ----- Regarding: RE: Needs OV early next week She was discharged earlier this morning, so please call and let her know.  Thanks-JLL ----- Message ----- From: Algernon Huxley, RN Sent: 02/22/2017   2:51 PM To: Levin Erp, PA Subject: RE: Needs OV early next week                   You are welcome. Will you let the pt know? ----- Message ----- From: Levin Erp, PA Sent: 02/22/2017   2:46 PM To: Algernon Huxley, RN Subject: RE: Needs OV early next week                   Thank you!  ----- Message ----- From: Algernon Huxley, RN Sent: 02/22/2017   2:20 PM To: Levin Erp, PA Subject: RE: Needs OV early next week                   Pt is scheduled to see Dr. Loletha Carrow 03/02/17@8 :30am. Order in for CBC and comment in appt note to have colon scheduled.  ----- Message ----- From: Levin Erp, PA Sent: 02/22/2017  12:13 PM To: Jeoffrey Massed, RN Subject: Needs OV early next week                       Needs OV with anyone available early next week-Jennifer Abbott pt- or app-follow up hospital visit for hematochezia- needs repeat cbc and arranged for colonoscopy at that appointment. Thanks-JLL

## 2017-02-23 NOTE — Patient Outreach (Signed)
Attempted to reach [employer] for post hospital discharge telephone screening outreach. Tifini was admitted on 02/19/17 to [facility] for acute lower GI bleed likely secondary to colitis and low platelet count,  diagnosed with ITP (Platelets <5) upon admission. While inpatient she was treated with IVIG x 2 doses, oral prednisone , and she received a transfusion of one unit of platelets and one unit of PRBC. At discharge on 7/31 her platelet count was 106 and she will take Prednisone 60 mg  in addition to her routine medications until she sees Dr. Alen Blew (oncology) in clinic next week. She will likely need an outpatient colonoscopy and this will be determined when she sees Dr. Loletha Carrow ([facility]) on 03/02/17 at 8:30 am. Left message on voice mail at the only contact number listed,  [phone],  requesting she return call to this RNCM in order to complete assessment and to determine if any CM needs are identified.  Barrington Ellison RN,CCM,CDE [place] Management Coordinator [facility] [employer] [phone] Office Fax [phone]

## 2017-02-25 ENCOUNTER — Other Ambulatory Visit: Payer: Self-pay | Admitting: *Deleted

## 2017-02-25 NOTE — Patient Outreach (Signed)
Second attempt to reach member for post hospital discharge outreach to assess for care management needs and to assess recovery from lower GI bleed requiring hospitalization from 7/28 to 7/31. Left  message on Shivangi's cell number requesting a return call. Barrington Ellison RN,CCM,CDE [place] Management Coordinator [facility] [employer] [phone] Office Fax [phone]

## 2017-02-28 ENCOUNTER — Other Ambulatory Visit (HOSPITAL_BASED_OUTPATIENT_CLINIC_OR_DEPARTMENT_OTHER): Payer: 59

## 2017-02-28 ENCOUNTER — Ambulatory Visit (HOSPITAL_BASED_OUTPATIENT_CLINIC_OR_DEPARTMENT_OTHER): Payer: 59 | Admitting: Oncology

## 2017-02-28 VITALS — BP 99/55 | HR 75 | Temp 99.0°F | Resp 18 | Ht 65.0 in | Wt 138.0 lb

## 2017-02-28 DIAGNOSIS — D62 Acute posthemorrhagic anemia: Secondary | ICD-10-CM

## 2017-02-28 DIAGNOSIS — D693 Immune thrombocytopenic purpura: Secondary | ICD-10-CM

## 2017-02-28 DIAGNOSIS — D72829 Elevated white blood cell count, unspecified: Secondary | ICD-10-CM

## 2017-02-28 DIAGNOSIS — D509 Iron deficiency anemia, unspecified: Secondary | ICD-10-CM

## 2017-02-28 LAB — CBC WITH DIFFERENTIAL/PLATELET
BASO%: 0.6 % (ref 0.0–2.0)
BASOS ABS: 0.3 10*3/uL — AB (ref 0.0–0.1)
EOS%: 26 % — AB (ref 0.0–7.0)
Eosinophils Absolute: 12.4 10*3/uL — ABNORMAL HIGH (ref 0.0–0.5)
HEMATOCRIT: 30.8 % — AB (ref 34.8–46.6)
HGB: 10.4 g/dL — ABNORMAL LOW (ref 11.6–15.9)
LYMPH#: 4 10*3/uL — AB (ref 0.9–3.3)
LYMPH%: 8.5 % — AB (ref 14.0–49.7)
MCH: 29.8 pg (ref 25.1–34.0)
MCHC: 33.6 g/dL (ref 31.5–36.0)
MCV: 88.9 fL (ref 79.5–101.0)
MONO#: 2 10*3/uL — AB (ref 0.1–0.9)
MONO%: 4.1 % (ref 0.0–14.0)
NEUT#: 28.9 10*3/uL — ABNORMAL HIGH (ref 1.5–6.5)
NEUT%: 60.8 % (ref 38.4–76.8)
PLATELETS: 6 10*3/uL — AB (ref 145–400)
RBC: 3.47 10*6/uL — AB (ref 3.70–5.45)
RDW: 13.6 % (ref 11.2–14.5)
WBC: 47.6 10*3/uL — ABNORMAL HIGH (ref 3.9–10.3)

## 2017-02-28 LAB — IRON AND TIBC
%SAT: 19 % — ABNORMAL LOW (ref 21–57)
Iron: 74 ug/dL (ref 41–142)
TIBC: 384 ug/dL (ref 236–444)
UIBC: 309 ug/dL (ref 120–384)

## 2017-02-28 LAB — FERRITIN: Ferritin: 70 ng/ml (ref 9–269)

## 2017-02-28 NOTE — Progress Notes (Signed)
Hematology and Oncology Follow Up Visit  Jennifer Abbott [id number] 07/16/1981 36 y.o. 02/28/2017 9:48 AM [name], Royetta Car, PA-CBreeback, Royetta Car, PA-C   Principle Diagnosis: 36 year old woman with ITP diagnosed on 02/22/2017. She presented with platelet count of less than 5000 and symptoms of colitis.   Prior Therapy:  She was treated with prednisone 60 mg daily started on 02/22/2017. She is status post IVIG 1 g/kg given for total of 2 days completed on 02/23/2017.  Current therapy:  Prednisone 60 mg daily. Under consideration to start rituximab in the near future.  Interim History:  Ms. Music presents today for a follow-up visit. He is a pleasant woman I saw in consultation on 02/22/2017. She presented with a petechiae, colitis and hematochezia. She was found to have platelet count of less than 5000 and CT images suggestive of active colitis. She was treated with prednisone and IVIG with an excellent response to her platelet counts initially count of 108 upon her discharge. In the last few days, she reported she is developing more petechiae and her hematochezia have recurred. She denied any hematemesis, hemoptysis or dizziness. She is also taken oral iron without issues.  She does not report any headaches, blurry vision, syncope or seizures. She does not report any fevers or chills or sweats. She does not report any cough, wheezing or hemoptysis. She does not report any nausea, vomiting or abdominal pain. She does not report any hematuria or dysuria. Remaining review of systems unremarkable.  Medications: I have reviewed the patient's current medications.  Current Outpatient Prescriptions  Medication Sig Dispense Refill  . atenolol (TENORMIN) 25 MG tablet Take 1.5 tablets (37.5 mg total) by mouth daily. 90 tablet 3  . citalopram (CELEXA) 40 MG tablet Take 0.5 tablets (20 mg total) by mouth daily. 90 tablet 0  . drospirenone-ethinyl estradiol (YAZ,GIANVI,LORYNA) 3-0.02 MG tablet Take 1  tablet by mouth daily. Please fill Gianvi only. Skip placebos. 3 Package 4  . ferrous sulfate 325 (65 FE) MG tablet Take 1 tablet (325 mg total) by mouth daily. 30 tablet 3  . ondansetron (ZOFRAN-ODT) 8 MG disintegrating tablet Take 8 mg by mouth every 6 (six) hours as needed for nausea or vomiting.     . predniSONE (DELTASONE) 20 MG tablet Take 3 tablets (60 mg total) by mouth daily with breakfast. 30 tablet 2  . Prenatal Vit-Fe Fumarate-FA (PRENATAL VITAMIN PLUS LOW IRON) 27-1 MG TABS Take 1 tablet by mouth daily. 90 tablet 3  . rizatriptan (MAXALT-MLT) 5 MG disintegrating tablet Take 1 tablet (5 mg total) by mouth as needed for migraine. May repeat in 2 hours if needed 10 tablet 11  . Topiramate ER (TROKENDI XR) 25 MG CP24 Take 1 tablet by mouth daily. (Patient taking differently: Take 25 mg by mouth daily. ) 30 capsule 3  . tretinoin (RETIN-A) 0.1 % cream Apply topically at bedtime. 15 g 3   No current facility-administered medications for this visit.      Allergies:  Allergies  Allergen Reactions  . Sulfa Antibiotics     Excessive yeast infection overload.  . Topamax [Topiramate]     Extreme taste changes.     Past Medical History, Surgical history, Social history, and Family History were reviewed and updated.  Physical Exam: Blood pressure (!) 99/55, pulse 75, temperature 99 F (37.2 C), temperature source Oral, resp. rate 18, height 5' 5" (1.651 m), weight 138 lb (62.6 kg), SpO2 100 %. ECOG: 0 General appearance: alert and cooperative appeared without distress.  Head: Normocephalic, without obvious abnormality. I will make a son did not show any evidence of bleeding. Neck: no adenopathy Lymph nodes: Cervical, supraclavicular, and axillary nodes normal. Heart:regular rate and rhythm, S1, S2 normal, no murmur, click, rub or gallop Lung:chest clear, no wheezing, rales, normal symmetric air entry. Abdomin: soft, non-tender, without masses or organomegaly EXT:no erythema,  induration, or nodules Skin: Petechial rash noted on her arm.  Lab Results: Lab Results  Component Value Date   WBC 47.6 (H) 02/28/2017   HGB 10.4 (L) 02/28/2017   HCT 30.8 (L) 02/28/2017   MCV 88.9 02/28/2017   PLT 6 (LL) 02/28/2017     Chemistry      Component Value Date/Time   NA 140 02/22/2017 0454   K 3.2 (L) 02/22/2017 0454   CL 112 (H) 02/22/2017 0454   CO2 24 02/22/2017 0454   BUN 11 02/22/2017 0454   CREATININE 0.63 02/22/2017 0454   CREATININE 0.74 02/14/2017 0914      Component Value Date/Time   CALCIUM 7.7 (L) 02/22/2017 0454   ALKPHOS 42 02/19/2017 2038   AST 10 (L) 02/19/2017 2038   ALT 9 (L) 02/19/2017 2038   BILITOT 0.2 (L) 02/19/2017 2038       Impression and Plan:  36 year old woman with the following issues:  1. 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 She responded initially to prednisone and IVIG and currently on prednisone 60 mg daily. Her platelet counts today personally reviewed by her peripheral smear appears to be very low again.  The natural course of this disease was discussed today with the patient. She appears to be developing autoimmune thrombocytopenia related to her active colitis. Given her lack of response to prednisone, she will require additional therapy. I feel that treating her colitis would help as well with her thrombocytopenia.  The options of therapy were reviewed today which include rituximab versus splenectomy. Risks and benefits associated with rituximab infusion was discussed. Complications include infusion related complications such as pruritus, hives and rarely anaphylaxis. She will require weekly infusion for 4 weeks in the immediate future.  I gave her a clean instructions if she develops some brisk hematochezia or any other bleeding or present to the emergency department immediately.  2. Leukocytosis and eosinophilia: Appears to  be related to her autoimmune disorder. I do not think she has a bone marrow disease but we will consider bone marrow biopsy if no resolution to her thrombocytopenia with rituximab.  3. Colitis: She has follow up with gastroenterology in the near future.  4. Iron deficiency anemia: Related to chronic blood losses and currently on oral iron. Hemoglobin is adequate at this time.  5. Follow-up: She will have weekly labs and rituximab for the next month.  [facility], MD 8/6/20189:48 AM

## 2017-02-28 NOTE — Progress Notes (Signed)
START OFF PATHWAY REGIMEN - [Other Dx]   [id number]:Rituximab (Weekly):   Administer weekly:     Rituximab   **Always confirm dose/schedule in your pharmacy ordering system**  Patient Characteristics: Intent of Therapy: Curative Intent, Discussed with Patient 

## 2017-03-01 ENCOUNTER — Telehealth: Payer: Self-pay | Admitting: Oncology

## 2017-03-01 NOTE — Telephone Encounter (Signed)
Scheduled appt per 8/6 los - unable to scheduled on 8/14 - per Dr. Alen Blew okay to start 8/16 - left message with appt date and time and set reminder letter in the mail.

## 2017-03-02 ENCOUNTER — Ambulatory Visit (INDEPENDENT_AMBULATORY_CARE_PROVIDER_SITE_OTHER): Payer: 59 | Admitting: Gastroenterology

## 2017-03-02 ENCOUNTER — Encounter: Payer: Self-pay | Admitting: Gastroenterology

## 2017-03-02 ENCOUNTER — Encounter: Payer: Self-pay | Admitting: Oncology

## 2017-03-02 ENCOUNTER — Telehealth: Payer: Self-pay | Admitting: *Deleted

## 2017-03-02 VITALS — BP 92/58 | HR 72 | Ht 65.0 in | Wt 140.6 lb

## 2017-03-02 DIAGNOSIS — K921 Melena: Secondary | ICD-10-CM | POA: Diagnosis not present

## 2017-03-02 DIAGNOSIS — K625 Hemorrhage of anus and rectum: Secondary | ICD-10-CM | POA: Diagnosis not present

## 2017-03-02 NOTE — Patient Instructions (Signed)
You have been scheduled for a colonoscopy. Please follow written instructions given to you at your visit today.  Please pick up your prep supplies at the pharmacy within the next 1-3 days. If you use inhalers (even only as needed), please bring them with you on the day of your procedure. Your physician has requested that you go to www.startemmi.com and enter the access code given to you at your visit today. This web site gives a general overview about your procedure. However, you should still follow specific instructions given to you by our office regarding your preparation for the procedure.  Your physician has requested that you go to the basement for a STAT cbc on Monday morning, Aug 13th at 7:30am.   If you are age 27 or older, your body mass index should be between 23-30. Your Body mass index is 23.4 kg/m. If this is out of the aforementioned range listed, please consider follow up with your Primary Care Provider.  If you are age 14 or younger, your body mass index should be between 19-25. Your Body mass index is 23.4 kg/m. If this is out of the aformentioned range listed, please consider follow up with your Primary Care Provider.

## 2017-03-02 NOTE — Addendum Note (Signed)
Addended by: Wyatt Portela on: 03/02/2017 09:38 AM   Modules accepted: Orders

## 2017-03-02 NOTE — Progress Notes (Signed)
Her case was discussed today with Dr. Loletha Carrow over the phone today. He is planning tentatively to proceed with a colonoscopy on Monday, August 13. In preparation for her colonoscopy, I will arrange for IVIG to be given at 1 g/kg for 2 more days to be given on August 9 and August 10. She will have a CBC drawn on August 13 prior to her colonoscopy.  She will receive IVIG at [facility] and she is aware of those appointments.  She will start rituximab on Thursday, August 16 for total of 4 weeks.

## 2017-03-02 NOTE — Progress Notes (Signed)
[facility] Progress Note  Chief Complaint: Hematochezia and colitis  Subjective  History:  This is a 36 year old woman I recently met during hospital stay. She was admitted with worsening rectal bleeding in a few months of intermittent diarrhea. Jennifer Abbott was found to have ITP with an initial platelet count of 5000. Steroids did not seem to improve that, but 2 doses of IVIG brought her platelet count of 106,000 at the time of discharge 2 days later. She required a PRBC transfusion during admission.Dr Alen Blew hematology has been managing her, and saw her 2 days ago in clinic. Unfortunately, her platelet count has dropped down to 6000 indicating that the IVIG effect only lasted several days. Fortunately, she is having very little rectal bleeding at this point. In fact, she might go 2 or 3 days without a BM, compared to the intermittent diarrhea she was having in the weeks and months leading up to the hospital stay. She is just fatigued and has not yet been able to go back to work. Dr. Alen Blew is planning to start Rituxumab infusions weekly beginning next Thursday in order to treat the ITP. Jennifer Abbott denies abdominal pain, her appetite is good and her weight stable. She is frustrated that the platelet count dropped down so severely again.  ROS: Cardiovascular:  no chest pain Respiratory: no dyspnea She continues to have a petechia rash on extremities, trunk and under her bottom lip.  The patient's Past Medical, Family and Social History were reviewed and are on file in the EMR.  Objective:  Med list reviewed  Vital signs in last 24 hrs: Vitals:   03/02/17 0817  BP: (!) 92/58  Pulse: 72    Physical Exam    HEENT: sclera anicteric, Petechia on hard palate and underside of bottom lip as before  Neck: supple, no thyromegaly, JVD or lymphadenopathy  Cardiac: RRR without murmurs, S1S2 heard, no peripheral edema  Pulm: clear to auscultation bilaterally, normal RR and effort  noted  Abdomen: soft, no tenderness, with active bowel sounds. No guarding or palpable hepatosplenomegaly.  Skin; warm and dry, no jaundice or rash  Recent Labs:  CBC Latest Ref Rng & Units 02/28/2017 02/22/2017 02/21/2017  WBC 3.9 - 10.3 10e3/uL 47.6(H) 23.4(H) 26.7(H)  Hemoglobin 11.6 - 15.9 g/dL 10.4(L) 8.1(L) 8.9(L)  Hematocrit 34.8 - 46.6 % 30.8(L) 23.8(L) 26.0(L)  Platelets 145 - 400 10e3/uL 6(LL) 106(L) 59(L)   Dr Ladona Ridgel the leukocytosis is related to this hematologic condition as well.   @ASSESSMENTPLANBEGIN @ Assessment: Encounter Diagnoses  Name Primary?  . Hematochezia Yes  . Rectal bleeding   ITP  Probable ulcerative pancolitis based on recent symptoms and imaging.  Jennifer Abbott and I had a long discussion today about her current condition and our options. I also spoke with her hematologist on the phone during her clinic visit today. I feel she needs a colonoscopy for a definite diagnosis so we can know what to do with medical treatment, regardless of whether her colitis symptoms continue to improve or worsen. I am encouraged that her bleeding has significantly decreased and her hemoglobin has improved.  She will need to IVIG treatments 2 days in a row in order to improve her platelet count a few days later and therefore allow colonoscopy. If that works as it did before, then 3 days after the second infusion her platelet count should be good enough to allow outpatient colonoscopy. The logistics of this are very complicated, also because she is scheduled to get her first  rituximab infusion week from tomorrow, and I do not want that to be delayed any further. She feels the same way.  Our current plan is for her to have IVIG tomorrow and the following day, and a colonoscopy 3 days later (monday). She will come to our clinic building early Monday morning for a stat CBC and wait for the results. If platelet count is good enough, she will take the remainder of her preparation that  morning for a colonoscopy that afternoon. She understands that her platelet count might not improve enough, and the colonoscopy might not be feasible that day. That is just a limitation of the current situation which we might have to contend with.  She understands that it is an increased risk procedure due to her thrombocytopenia. However, I believe it can be done in the outpatient setting if her platelet count respond sufficiently to IVIG. I believe the colitis is also under much better control now that it was when I first saw her 10 days ago.   The benefits and risks of the planned procedure were described in detail with the patient or (when appropriate) their health care proxy.  Risks were outlined as including, but not limited to, bleeding, infection, perforation, adverse medication reaction leading to cardiac or pulmonary decompensation, or pancreatitis (if ERCP).  The limitation of incomplete mucosal visualization was also discussed.  No guarantees or warranties were given.   Total time 45 minutes, over half spent in counseling and coordination of care.   Nelida Meuse III   CC; Zola Button, MD

## 2017-03-02 NOTE — Telephone Encounter (Signed)
Per Dr. Alen Blew, patient needs to be scheduled for IVIG urgently on 8/9 & 8/10. Treatment plan has been entered under supportive therapy, PA completed, and she is scheduled at [facility] for both days at 8:30 am. Left a detailed message with instructions of her appointment times and dates and to call [facility] (desk nurse) if she has any questions or concerns.

## 2017-03-03 ENCOUNTER — Encounter (HOSPITAL_COMMUNITY): Payer: 59

## 2017-03-03 ENCOUNTER — Ambulatory Visit (HOSPITAL_BASED_OUTPATIENT_CLINIC_OR_DEPARTMENT_OTHER): Payer: 59

## 2017-03-03 VITALS — BP 99/54 | HR 55 | Temp 98.6°F | Resp 20

## 2017-03-03 DIAGNOSIS — D693 Immune thrombocytopenic purpura: Secondary | ICD-10-CM

## 2017-03-03 MED ORDER — IMMUNE GLOBULIN (HUMAN) 20 GM/200ML IV SOLN
60.0000 g | INTRAVENOUS | Status: DC
  Administered 2017-03-03: 60 g via INTRAVENOUS
  Filled 2017-03-03: qty 600

## 2017-03-03 MED ORDER — DIPHENHYDRAMINE HCL 25 MG PO CAPS
ORAL_CAPSULE | ORAL | Status: AC
  Filled 2017-03-03: qty 1

## 2017-03-03 MED ORDER — DIPHENHYDRAMINE HCL 25 MG PO TABS
25.0000 mg | ORAL_TABLET | Freq: Once | ORAL | Status: AC
  Administered 2017-03-03: 25 mg via ORAL
  Filled 2017-03-03: qty 1

## 2017-03-03 MED ORDER — ACETAMINOPHEN 325 MG PO TABS
650.0000 mg | ORAL_TABLET | Freq: Once | ORAL | Status: AC
  Administered 2017-03-03: 650 mg via ORAL

## 2017-03-03 MED ORDER — ACETAMINOPHEN 325 MG PO TABS
ORAL_TABLET | ORAL | Status: AC
  Filled 2017-03-03: qty 2

## 2017-03-03 MED ORDER — SODIUM CHLORIDE 0.9 % IV SOLN
Freq: Once | INTRAVENOUS | Status: AC
Start: 2017-03-03 — End: 2017-03-03
  Administered 2017-03-03: 09:00:00 via INTRAVENOUS

## 2017-03-03 MED FILL — predniSONE 20 MG TABS: 20 | 10 days supply | Qty: 30 | Fill #1

## 2017-03-03 MED FILL — GIANVI 3-0.02 MG TABS: 3-0.02 | 63 days supply | Qty: 84 | Fill #1

## 2017-03-04 ENCOUNTER — Ambulatory Visit (HOSPITAL_BASED_OUTPATIENT_CLINIC_OR_DEPARTMENT_OTHER): Payer: 59

## 2017-03-04 DIAGNOSIS — D693 Immune thrombocytopenic purpura: Secondary | ICD-10-CM | POA: Diagnosis not present

## 2017-03-04 MED ORDER — DIPHENHYDRAMINE HCL 25 MG PO CAPS
ORAL_CAPSULE | ORAL | Status: AC
Start: 2017-03-04 — End: 2017-03-04
  Filled 2017-03-04: qty 1

## 2017-03-04 MED ORDER — DEXTROSE 5 % IV SOLN
INTRAVENOUS | Status: DC
  Administered 2017-03-04: 09:00:00 via INTRAVENOUS

## 2017-03-04 MED ORDER — SODIUM CHLORIDE 0.9 % IV SOLN: Freq: Once | INTRAVENOUS | Status: DC

## 2017-03-04 MED ORDER — IMMUNE GLOBULIN (HUMAN) 10 GM/100ML IV SOLN
1.0000 g/kg | INTRAVENOUS | Status: DC
  Administered 2017-03-04: 60 g via INTRAVENOUS
  Filled 2017-03-04: qty 600

## 2017-03-04 MED ORDER — DIPHENHYDRAMINE HCL 25 MG PO TABS
25.0000 mg | ORAL_TABLET | Freq: Once | ORAL | Status: AC
  Administered 2017-03-04: 25 mg via ORAL
  Filled 2017-03-04: qty 1

## 2017-03-04 MED ORDER — ACETAMINOPHEN 325 MG PO TABS
650.0000 mg | ORAL_TABLET | Freq: Once | ORAL | Status: AC
  Administered 2017-03-04: 650 mg via ORAL

## 2017-03-04 MED ORDER — ACETAMINOPHEN 325 MG PO TABS
ORAL_TABLET | ORAL | Status: AC
  Filled 2017-03-04: qty 2

## 2017-03-04 MED FILL — CITALOPRAM HBR 40 MG TABLET: 40 | 90 days supply | Qty: 45 | Fill #1

## 2017-03-06 ENCOUNTER — Telehealth: Payer: Self-pay | Admitting: Internal Medicine

## 2017-03-06 NOTE — Telephone Encounter (Signed)
Pt for colonoscopy tomorrow with Dr. Criss Rosales tolerate Suprep due to taste and texture.  Has tried but only able to consume 3 oz of active prep. I instructed her how to change to MiraLax prep and she will try to continue for procedure tomorrow

## 2017-03-07 ENCOUNTER — Encounter: Payer: Self-pay | Admitting: *Deleted

## 2017-03-07 ENCOUNTER — Encounter: Payer: Self-pay | Admitting: Gastroenterology

## 2017-03-07 ENCOUNTER — Other Ambulatory Visit: Payer: Self-pay | Admitting: *Deleted

## 2017-03-07 ENCOUNTER — Other Ambulatory Visit (INDEPENDENT_AMBULATORY_CARE_PROVIDER_SITE_OTHER): Payer: 59

## 2017-03-07 ENCOUNTER — Ambulatory Visit (AMBULATORY_SURGERY_CENTER): Payer: 59 | Admitting: Gastroenterology

## 2017-03-07 ENCOUNTER — Other Ambulatory Visit: Payer: 59

## 2017-03-07 VITALS — BP 95/52 | HR 74 | Temp 98.6°F | Resp 12 | Ht 66.0 in | Wt 134.0 lb

## 2017-03-07 DIAGNOSIS — K51011 Ulcerative (chronic) pancolitis with rectal bleeding: Secondary | ICD-10-CM

## 2017-03-07 DIAGNOSIS — K921 Melena: Secondary | ICD-10-CM

## 2017-03-07 DIAGNOSIS — I4940 Unspecified premature depolarization: Secondary | ICD-10-CM | POA: Diagnosis not present

## 2017-03-07 DIAGNOSIS — K625 Hemorrhage of anus and rectum: Secondary | ICD-10-CM | POA: Diagnosis not present

## 2017-03-07 LAB — CBC WITH DIFFERENTIAL/PLATELET
Basophils Absolute: 0.3 10*3/uL — ABNORMAL HIGH (ref 0.0–0.1)
Basophils Relative: 0.5 % (ref 0.0–3.0)
EOS ABS: 27.3 10*3/uL — AB (ref 0.0–0.7)
Eosinophils Relative: 44 % — ABNORMAL HIGH (ref 0.0–5.0)
HCT: 27.9 % — ABNORMAL LOW (ref 36.0–46.0)
HEMOGLOBIN: 9.4 g/dL — AB (ref 12.0–15.0)
Lymphocytes Relative: 4.4 % — ABNORMAL LOW (ref 12.0–46.0)
Lymphs Abs: 2.7 10*3/uL (ref 0.7–4.0)
MCHC: 33.5 g/dL (ref 30.0–36.0)
MCV: 94.5 fl (ref 78.0–100.0)
MONO ABS: 0.1 10*3/uL (ref 0.1–1.0)
Monocytes Relative: 0.1 % — ABNORMAL LOW (ref 3.0–12.0)
Neutro Abs: 31.6 10*3/uL — ABNORMAL HIGH (ref 1.4–7.7)
Neutrophils Relative %: 51 % (ref 43.0–77.0)
Platelets: 808 10*3/uL — ABNORMAL HIGH (ref 150.0–400.0)
RBC: 2.96 Mil/uL — AB (ref 3.87–5.11)
RDW: 14.9 % (ref 11.5–15.5)
WBC: 61.4 10*3/uL (ref 4.0–10.5)

## 2017-03-07 MED ORDER — SODIUM CHLORIDE 0.9 % IV SOLN: 500.0000 mL | INTRAVENOUS | Status: DC

## 2017-03-07 NOTE — Op Note (Signed)
[facility] Patient Name: Jennifer Abbott Procedure Date: 03/07/2017 2:36 PM MRN: [id number] Endoscopist: Mallie Mussel L. Loletha Carrow , MD Age: 36 Referring MD:  Date of Birth: [birth year] Gender: Female Account #: [id number] Procedure:                Colonoscopy Indications:              Hematochezia, Acute post hemorrhagic anemia,                            Abnormal CT of the GI tract suggesting colitis                            (recent onset intermittent diarrhea as well)                           Also diagnosed simultaneously with severe ITP                            requiring IVIG and soon to begin rituxumab Medicines:                Monitored Anesthesia Care Procedure:                Pre-Anesthesia Assessment:                           - Prior to the procedure, a History and Physical                            was performed, and patient medications and                            allergies were reviewed. The patient's tolerance of                            previous anesthesia was also reviewed. The risks                            and benefits of the procedure and the sedation                            options and risks were discussed with the patient.                            All questions were answered, and informed consent                            was obtained. Prior Anticoagulants: The patient has                            taken no previous anticoagulant or antiplatelet                            agents. ASA Grade Assessment: III - A patient with  severe systemic disease. After reviewing the risks                            and benefits, the patient was deemed in                            satisfactory condition to undergo the procedure.                           After obtaining informed consent, the colonoscope                            was passed under direct vision. Throughout the                            procedure, the patient's blood  pressure, pulse, and                            oxygen saturations were monitored continuously. The                            Model PCF-[id number]) scope was introduced                            through the anus and advanced to the the terminal                            ileum. The colonoscopy was performed without                            difficulty. The patient tolerated the procedure                            well. The quality of the bowel preparation was                            good. The terminal ileum, ileocecal valve,                            appendiceal orifice, and rectum were photographed.                            The quality of the bowel preparation was evaluated                            using the BBPS [facility] Bowel Preparation Scale)                            with scores of: Right Colon = 2, Transverse Colon =                            2 and Left Colon = 2. The total BBPS score equals  6. The bowel preparation used was Miralax/gatorade                            (patient was unable to tolerate Suprep). Scope In: 2:50:46 PM Scope Out: 3:05:40 PM Scope Withdrawal Time: 0 hours 11 minutes 47 seconds  Total Procedure Duration: 0 hours 14 minutes 54 seconds  Findings:                 The perianal and digital rectal examinations were                            normal.                           The terminal ileum appeared normal.                           Some patchy areas of mildly erythematous mucosa                            were found in the entire colon, moreso in the left                            colon. Four biopsies were obtained in the                            transverse colon and in the ascending colon with                            cold forceps for histology. Four biopsies were                            obtained in the sigmoid colon and in the descending                            colon with cold forceps for  histology.                           The exam was otherwise without abnormality on                            direct and retroflexion views. Complications:            No immediate complications. Estimated Blood Loss:     Estimated blood loss was minimal. Impression:               - The examined portion of the ileum was normal.                           - Erythematous mucosa in the entire examined colon.                           - The examination was otherwise normal on direct  and retroflexion views.                           - Four biopsies were obtained in the transverse                            colon and in the ascending colon.                           - Four biopsies were obtained in the sigmoid colon                            and in the descending colon. Recommendation:           - Patient has a contact number available for                            emergencies. The signs and symptoms of potential                            delayed complications were discussed with the                            patient. Return to normal activities tomorrow.                            Written discharge instructions were provided to the                            patient.                           - Resume previous diet.                           - Continue present medications, but decrease                            prednisone to 30 mg once daily. Further plan to                            taper pending biopsy results.                           - Await pathology results.                           - No recommendation at this time regarding repeat                            colonoscopy. [name] L. Loletha Carrow, MD 03/07/2017 3:15:16 PM This report has been signed electronically.

## 2017-03-07 NOTE — Patient Outreach (Signed)
[facility]) Care Management  03/07/2017  [name] [birth year] [id number]   Subjective: Telephone call to patient's home number, spoke with patient, and HIPAA verified.  Discussed [facility] Care Management [facility] Transition of care follow up, patient voiced understanding, and is in agreement to follow up.   Patient states she has her good and bad days, still has not returned to work.  States she is having a colonoscopy at 1:30pm today and will be leaving shortly for the procedure.  Had hospital follow up appointment with hematologist on 02/28/17 and gastroenterologist on 03/02/17.   Will have infusion on 8/ 16/18.  Patient voices understanding of medical diagnosis and treatment plan.  States she is accessing the following [facility] benefits: outpatient pharmacy, hospital indemnity (will verify online if she choose this benefit and file claim if appropriate, and will follow up with [employer] regarding status of family medical leave act (FMLA) approval.   States the paperwork has been sent to providers and she is aware that she will need to follow up to verify information sent to [employer].  Patient states she does not have any education material, transition of care, care coordination, disease management, disease monitoring, transportation, community resource, or pharmacy needs at this time. States she is very appreciative of the follow up and is in agreement to receive [facility] Management information.    Objective: Per chart review, patient hospitalized 02/19/17 - 02/22/17 for Acute ITP.   Patient also has a history of anemia and colitis.    Assessment: Received [facility] Transition of care referral on 02/21/17.  Transition of care follow up completed, no care management needs, and will proceed with case closure.    Plan: RNCM will send patient successful outreach letter, [facility] pamphlet, and magnet. RNCM will send case closure due to follow up completed / no care management needs request to [facility]  [place] [facility].   Darriana Deboy H. Annia Friendly, BSN, [place] Management [facility] Telephonic CM Phone: [phone] Fax: [phone]

## 2017-03-07 NOTE — Patient Instructions (Signed)
YOU HAD AN ENDOSCOPIC PROCEDURE TODAY AT [facility]:   Refer to the procedure report that was given to you for any specific questions about what was found during the examination.  If the procedure report does not answer your questions, please call your gastroenterologist to clarify.  If you requested that your care partner not be given the details of your procedure findings, then the procedure report has been included in a sealed envelope for you to review at your convenience later.  YOU SHOULD EXPECT: Some feelings of bloating in the abdomen. Passage of more gas than usual.  Walking can help get rid of the air that was put into your GI tract during the procedure and reduce the bloating. If you had a lower endoscopy (such as a colonoscopy or flexible sigmoidoscopy) you may notice spotting of blood in your stool or on the toilet paper. If you underwent a bowel prep for your procedure, you may not have a normal bowel movement for a few days.  Please Note:  You might notice some irritation and congestion in your nose or some drainage.  This is from the oxygen used during your procedure.  There is no need for concern and it should clear up in a day or so.  SYMPTOMS TO REPORT IMMEDIATELY:   Following lower endoscopy (colonoscopy or flexible sigmoidoscopy):  Excessive amounts of blood in the stool  Significant tenderness or worsening of abdominal pains  Swelling of the abdomen that is new, acute  Fever of 100F or higher  For urgent or emergent issues, a gastroenterologist can be reached at any hour by calling [phone].   DIET:  We do recommend a small meal at first, but then you may proceed to your regular diet.  Drink plenty of fluids but you should avoid alcoholic beverages for 24 hours.  ACTIVITY:  You should plan to take it easy for the rest of today and you should NOT DRIVE or use heavy machinery until tomorrow (because of the sedation medicines used during the test).     FOLLOW UP: Our staff will call the number listed on your records the next business day following your procedure to check on you and address any questions or concerns that you may have regarding the information given to you following your procedure. If we do not reach you, we will leave a message.  However, if you are feeling well and you are not experiencing any problems, there is no need to return our call.  We will assume that you have returned to your regular daily activities without incident.  If any biopsies were taken you will be contacted by phone or by letter within the next 1-3 weeks.  Please call us at [phone] if you have not heard about the biopsies in 3 weeks.    SIGNATURES/CONFIDENTIALITY: You and/or your care partner have signed paperwork which will be entered into your electronic medical record.  These signatures attest to the fact that that the information above on your After Visit Summary has been reviewed and is understood.  Full responsibility of the confidentiality of this discharge information lies with you and/or your care-partner.  Dr. Loletha Carrow would like for you to decrease the dosage of prednisone from 60 mg to 30 mg daily.  Further plan is to taper pending pathology results.

## 2017-03-07 NOTE — Progress Notes (Signed)
  [facility] Post-op Note  Patient: Jennifer Abbott  Procedure(s) Performed: colonoscopy  Patient Location: [facility] - Recovery Area  Anesthesia Type: Deep Sedation/Propofol  Level of Consciousness: awake, oriented and patient cooperative  Airway and Oxygen Therapy: Patient Spontanous Breathing  Post-op Pain: none  Post-op Assessment:  Post-op Vital signs reviewed, Patient's Cardiovascular Status Stable, Respiratory Function Stable, Patent Airway, No signs of Nausea or vomiting and Pain level controlled  Post-op Vital Signs: Reviewed and stable  Complications: No apparent anesthesia complications  Aadit Hagood E Mardell Cragg 3:11 PM

## 2017-03-07 NOTE — Progress Notes (Signed)
Called to room to assist during endoscopic procedure.  Patient ID and intended procedure confirmed with present staff. Received instructions for my participation in the procedure from the performing physician.  

## 2017-03-08 ENCOUNTER — Telehealth: Payer: Self-pay | Admitting: *Deleted

## 2017-03-08 ENCOUNTER — Telehealth: Payer: Self-pay

## 2017-03-08 LAB — PATHOLOGIST SMEAR REVIEW

## 2017-03-08 NOTE — Telephone Encounter (Signed)

## 2017-03-08 NOTE — Telephone Encounter (Signed)
Paperwork for FMLA has been completed by Dr Loletha Carrow. Faxed all documentation to  [phone]. Left a voicemail to inform the patient of current actions.

## 2017-03-08 NOTE — Telephone Encounter (Signed)
Left message on f/u call 

## 2017-03-09 ENCOUNTER — Other Ambulatory Visit: Payer: Self-pay | Admitting: Pharmacist

## 2017-03-09 DIAGNOSIS — D693 Immune thrombocytopenic purpura: Secondary | ICD-10-CM

## 2017-03-10 ENCOUNTER — Other Ambulatory Visit (HOSPITAL_BASED_OUTPATIENT_CLINIC_OR_DEPARTMENT_OTHER): Payer: 59

## 2017-03-10 ENCOUNTER — Ambulatory Visit (HOSPITAL_BASED_OUTPATIENT_CLINIC_OR_DEPARTMENT_OTHER): Payer: 59

## 2017-03-10 VITALS — BP 112/64 | HR 74 | Temp 100.1°F | Resp 20

## 2017-03-10 DIAGNOSIS — Z5112 Encounter for antineoplastic immunotherapy: Secondary | ICD-10-CM | POA: Diagnosis not present

## 2017-03-10 DIAGNOSIS — D693 Immune thrombocytopenic purpura: Secondary | ICD-10-CM

## 2017-03-10 DIAGNOSIS — D62 Acute posthemorrhagic anemia: Secondary | ICD-10-CM

## 2017-03-10 LAB — CBC WITH DIFFERENTIAL/PLATELET
BASO%: 0.4 % (ref 0.0–2.0)
Basophils Absolute: 0.2 10*3/uL — ABNORMAL HIGH (ref 0.0–0.1)
EOS%: 51.7 % — AB (ref 0.0–7.0)
Eosinophils Absolute: 25 10*3/uL — ABNORMAL HIGH (ref 0.0–0.5)
HCT: 27.6 % — ABNORMAL LOW (ref 34.8–46.6)
HEMOGLOBIN: 9.5 g/dL — AB (ref 11.6–15.9)
LYMPH#: 4.3 10*3/uL — AB (ref 0.9–3.3)
LYMPH%: 8.9 % — ABNORMAL LOW (ref 14.0–49.7)
MCH: 31.5 pg (ref 25.1–34.0)
MCHC: 34.5 g/dL (ref 31.5–36.0)
MCV: 91.4 fL (ref 79.5–101.0)
MONO#: 1.5 10*3/uL — ABNORMAL HIGH (ref 0.1–0.9)
MONO%: 3.1 % (ref 0.0–14.0)
NEUT%: 35.9 % — ABNORMAL LOW (ref 38.4–76.8)
NEUTROS ABS: 17.4 10*3/uL — AB (ref 1.5–6.5)
NRBC: 0 % (ref 0–0)
Platelets: 636 10*3/uL — ABNORMAL HIGH (ref 145–400)
RBC: 3.02 10*6/uL — AB (ref 3.70–5.45)
RDW: 15.2 % — AB (ref 11.2–14.5)
WBC: 48.4 10*3/uL — AB (ref 3.9–10.3)

## 2017-03-10 LAB — TECHNOLOGIST REVIEW

## 2017-03-10 MED ORDER — DIPHENHYDRAMINE HCL 25 MG PO CAPS
ORAL_CAPSULE | ORAL | Status: AC
  Filled 2017-03-10: qty 2

## 2017-03-10 MED ORDER — ACETAMINOPHEN 325 MG PO TABS
ORAL_TABLET | ORAL | Status: AC
  Filled 2017-03-10: qty 2

## 2017-03-10 MED ORDER — SODIUM CHLORIDE 0.9 % IV SOLN
375.0000 mg/m2 | Freq: Once | INTRAVENOUS | Status: AC
  Administered 2017-03-10: 600 mg via INTRAVENOUS
  Filled 2017-03-10: qty 50

## 2017-03-10 MED ORDER — ACETAMINOPHEN 325 MG PO TABS
650.0000 mg | ORAL_TABLET | Freq: Once | ORAL | Status: AC
  Administered 2017-03-10: 650 mg via ORAL

## 2017-03-10 MED ORDER — DIPHENHYDRAMINE HCL 25 MG PO CAPS
50.0000 mg | ORAL_CAPSULE | Freq: Once | ORAL | Status: AC
  Administered 2017-03-10: 50 mg via ORAL

## 2017-03-10 MED ORDER — SODIUM CHLORIDE 0.9 % IV SOLN
Freq: Once | INTRAVENOUS | Status: AC
  Administered 2017-03-10: 11:00:00 via INTRAVENOUS

## 2017-03-10 NOTE — Progress Notes (Signed)
Patient displayed temperatures of 99.0-100.1 during the duration of 1st time Rituxan infusion.  Patient is discharged home, per Dr. Alen Blew who is aware. Patient discharged home with no complaints or concerns.  No signs or symptoms of distress at discharge. Post vital signs stable.

## 2017-03-10 NOTE — Patient Instructions (Signed)
[facility] Discharge Instructions for Patients Receiving Chemotherapy  Today you received the following chemotherapy agents Rituxan  To help prevent nausea and vomiting after your treatment, we encourage you to take your nausea medication    If you develop nausea and vomiting that is not controlled by your nausea medication, call the clinic.   BELOW ARE SYMPTOMS THAT SHOULD BE REPORTED IMMEDIATELY:  *FEVER GREATER THAN 100.5 F  *CHILLS WITH OR WITHOUT FEVER  NAUSEA AND VOMITING THAT IS NOT CONTROLLED WITH YOUR NAUSEA MEDICATION  *UNUSUAL SHORTNESS OF BREATH  *UNUSUAL BRUISING OR BLEEDING  TENDERNESS IN MOUTH AND THROAT WITH OR WITHOUT PRESENCE OF ULCERS  *URINARY PROBLEMS  *BOWEL PROBLEMS  UNUSUAL RASH Items with * indicate a potential emergency and should be followed up as soon as possible.  Feel free to call the clinic you have any questions or concerns. The clinic phone number is [phone].  Please show the CHEMO ALERT CARD at check-in to the Emergency Department and triage nurse.  Rituximab injection What is this medicine? RITUXIMAB (ri TUX i mab) is a monoclonal antibody. It is used to treat certain types of cancer like non-Hodgkin lymphoma and chronic lymphocytic leukemia. It is also used to treat rheumatoid arthritis, granulomatosis with polyangiitis (or Wegener's granulomatosis), and microscopic polyangiitis. This medicine may be used for other purposes; ask your health care provider or pharmacist if you have questions. COMMON BRAND NAME(S): Rituxan What should I tell my health care provider before I take this medicine? They need to know if you have any of these conditions: -heart disease -infection (especially a virus infection such as hepatitis B, chickenpox, cold sores, or herpes) -immune system problems -irregular heartbeat -kidney disease -lung or breathing disease, like asthma -recently received or scheduled to receive a vaccine -an  unusual or allergic reaction to rituximab, mouse proteins, other medicines, foods, dyes, or preservatives -pregnant or trying to get pregnant -breast-feeding How should I use this medicine? This medicine is for infusion into a vein. It is administered in a hospital or clinic by a specially trained health care professional. A special MedGuide will be given to you by the pharmacist with each prescription and refill. Be sure to read this information carefully each time. Talk to your pediatrician regarding the use of this medicine in children. This medicine is not approved for use in children. Overdosage: If you think you have taken too much of this medicine contact a poison control center or emergency room at once. NOTE: This medicine is only for you. Do not share this medicine with others. What if I miss a dose? It is important not to miss a dose. Call your doctor or health care professional if you are unable to keep an appointment. What may interact with this medicine? -cisplatin -other medicines for arthritis like disease modifying antirheumatic drugs or tumor necrosis factor inhibitors -live virus vaccines This list may not describe all possible interactions. Give your health care provider a list of all the medicines, herbs, non-prescription drugs, or dietary supplements you use. Also tell them if you smoke, drink alcohol, or use illegal drugs. Some items may interact with your medicine. What should I watch for while using this medicine? Your condition will be monitored carefully while you are receiving this medicine. You may need blood work done while you are taking this medicine. This medicine can cause serious allergic reactions. To reduce your risk you may need to take medicine before treatment with this medicine. Take your medicine as directed.   In some patients, this medicine may cause a serious brain infection that may cause death. If you have any problems seeing, thinking, speaking,  walking, or standing, tell your doctor right away. If you cannot reach your doctor, urgently seek other source of medical care. Call your doctor or health care professional for advice if you get a fever, chills or sore throat, or other symptoms of a cold or flu. Do not treat yourself. This drug decreases your body's ability to fight infections. Try to avoid being around people who are sick. Do not become pregnant while taking this medicine or for 12 months after stopping it. Women should inform their doctor if they wish to become pregnant or think they might be pregnant. There is a potential for serious side effects to an unborn child. Talk to your health care professional or pharmacist for more information. What side effects may I notice from receiving this medicine? Side effects that you should report to your doctor or health care professional as soon as possible: -breathing problems -chest pain -dizziness or feeling faint -fast, irregular heartbeat -low blood counts - this medicine may decrease the number of white blood cells, red blood cells and platelets. You may be at increased risk for infections and bleeding. -mouth sores -redness, blistering, peeling or loosening of the skin, including inside the mouth (this can be added for any serious or exfoliative rash that could lead to hospitalization) -signs of infection - fever or chills, cough, sore throat, pain or difficulty passing urine -signs and symptoms of kidney injury like trouble passing urine or change in the amount of urine -signs and symptoms of liver injury like dark yellow or brown urine; general ill feeling or flu-like symptoms; light-colored stools; loss of appetite; nausea; right upper belly pain; unusually weak or tired; yellowing of the eyes or skin -stomach pain -vomiting Side effects that usually do not require medical attention (report to your doctor or health care professional if they continue or are  bothersome): -headache -joint pain -muscle cramps or muscle pain This list may not describe all possible side effects. Call your doctor for medical advice about side effects. You may report side effects to FDA at 1-800-FDA-1088. Where should I keep my medicine? This drug is given in a hospital or clinic and will not be stored at home. NOTE: This sheet is a summary. It may not cover all possible information. If you have questions about this medicine, talk to your doctor, pharmacist, or health care provider.  2018 Elsevier/Gold Standard (2016-02-18 15:28:09)   

## 2017-03-11 ENCOUNTER — Other Ambulatory Visit: Payer: Self-pay

## 2017-03-11 ENCOUNTER — Telehealth: Payer: Self-pay | Admitting: Gastroenterology

## 2017-03-11 LAB — HEPATITIS B CORE ANTIBODY, IGM: Hep B Core Ab, IgM: NEGATIVE

## 2017-03-11 LAB — HEPATITIS B SURFACE ANTIBODY,QUALITATIVE: Hep B Surface Ab, Qual: REACTIVE

## 2017-03-11 LAB — HEPATITIS B SURFACE ANTIGEN: HEP B S AG: NEGATIVE

## 2017-03-11 MED ORDER — PREDNISONE 5 MG PO TABS: 25.0000 mg | ORAL_TABLET | ORAL | 0 refills | Status: DC

## 2017-03-11 MED ORDER — MESALAMINE 800 MG PO TBEC: 2.0000 | DELAYED_RELEASE_TABLET | Freq: Two times a day (BID) | ORAL | 2 refills | Status: DC

## 2017-03-11 MED FILL — MESALAMINE 800 MG DR TABLET: 800 | 30 days supply | Qty: 120 | Fill #0

## 2017-03-11 MED FILL — predniSONE 5 MG TABS: 5 | 15 days supply | Qty: 75 | Fill #0

## 2017-03-11 NOTE — Telephone Encounter (Signed)
RX resent to med center as requested by pt.

## 2017-03-14 ENCOUNTER — Telehealth: Payer: Self-pay

## 2017-03-14 NOTE — Telephone Encounter (Signed)
Dr Loletha Carrow did you mean to start Asacol now? She has been on prednisone for 4 weeks.  Please advise

## 2017-03-14 NOTE — Telephone Encounter (Signed)
Pt aware and will start asacol now and taper as directed

## 2017-03-14 NOTE — Telephone Encounter (Signed)
My apologies for the confusion.  Yes, earlier today I meant to reply start the Asacol now and taper prednisone as previously directed.  - HD

## 2017-03-14 NOTE — Telephone Encounter (Signed)
Left message on machine to call back  

## 2017-03-14 NOTE — Telephone Encounter (Signed)
-----   Message from Doran Stabler, MD sent at 03/14/2017  6:47 AM EDT -----  I would like Chad to start the prednisone now, please.  - HD ----- Message ----- From: Doristine Counter, RN Sent: 03/11/2017  12:03 PM To: Nelida Meuse III, MD  Do you want her to start taking the Asacol after she finishes prednisone? I had to send in another Rx for 5 mg prednisone tablets, she has 20 mg tablets currently and cannot taper properly.

## 2017-03-15 ENCOUNTER — Telehealth: Payer: Self-pay

## 2017-03-15 NOTE — Telephone Encounter (Signed)
Pt had 1st rituxan last Thursday 8/16. She is asking if she can have rapid rituxan infusion next visit. She did have mild temp 99.0-100.1 during infusion. She had tmax at home of 100.4 about 24 hours after infusion. No other infusion issues occurred.

## 2017-03-15 NOTE — Telephone Encounter (Signed)
S/w pt about next appt being longer rituxan infusion.

## 2017-03-15 NOTE — Telephone Encounter (Signed)
I have no objections. 

## 2017-03-15 NOTE — Telephone Encounter (Signed)
S/w Jenna in pharmacy, d/t fevers on first rituxan, pharmacy will keep longer infusion next appt, then revisit the possibility of rapid rituxan at that time. Eliezer Lofts has made a notation in the pharmacy chart.

## 2017-03-17 ENCOUNTER — Ambulatory Visit (HOSPITAL_BASED_OUTPATIENT_CLINIC_OR_DEPARTMENT_OTHER): Payer: 59

## 2017-03-17 ENCOUNTER — Other Ambulatory Visit (HOSPITAL_BASED_OUTPATIENT_CLINIC_OR_DEPARTMENT_OTHER): Payer: 59

## 2017-03-17 VITALS — BP 108/66 | HR 86 | Temp 99.3°F | Resp 20

## 2017-03-17 DIAGNOSIS — D693 Immune thrombocytopenic purpura: Secondary | ICD-10-CM | POA: Diagnosis not present

## 2017-03-17 DIAGNOSIS — Z5112 Encounter for antineoplastic immunotherapy: Secondary | ICD-10-CM | POA: Diagnosis not present

## 2017-03-17 DIAGNOSIS — D62 Acute posthemorrhagic anemia: Secondary | ICD-10-CM

## 2017-03-17 LAB — CBC WITH DIFFERENTIAL/PLATELET
BASO%: 1.1 % (ref 0.0–2.0)
Basophils Absolute: 0.3 10*3/uL — ABNORMAL HIGH (ref 0.0–0.1)
EOS%: 31.3 % — ABNORMAL HIGH (ref 0.0–7.0)
Eosinophils Absolute: 8.9 10*3/uL — ABNORMAL HIGH (ref 0.0–0.5)
HCT: 29.5 % — ABNORMAL LOW (ref 34.8–46.6)
HGB: 9.9 g/dL — ABNORMAL LOW (ref 11.6–15.9)
LYMPH%: 6 % — ABNORMAL LOW (ref 14.0–49.7)
MCH: 30.7 pg (ref 25.1–34.0)
MCHC: 33.6 g/dL (ref 31.5–36.0)
MCV: 91.4 fL (ref 79.5–101.0)
MONO#: 0.9 10*3/uL (ref 0.1–0.9)
MONO%: 3.1 % (ref 0.0–14.0)
NEUT#: 16.6 10*3/uL — ABNORMAL HIGH (ref 1.5–6.5)
NEUT%: 58.5 % (ref 38.4–76.8)
Platelets: 262 10*3/uL (ref 145–400)
RBC: 3.23 10*6/uL — ABNORMAL LOW (ref 3.70–5.45)
RDW: 14.2 % (ref 11.2–14.5)
WBC: 28.3 10*3/uL — ABNORMAL HIGH (ref 3.9–10.3)
lymph#: 1.7 10*3/uL (ref 0.9–3.3)
nRBC: 0 % (ref 0–0)

## 2017-03-17 MED ORDER — ACETAMINOPHEN 325 MG PO TABS
650.0000 mg | ORAL_TABLET | Freq: Once | ORAL | Status: AC
  Administered 2017-03-17: 650 mg via ORAL

## 2017-03-17 MED ORDER — ACETAMINOPHEN 325 MG PO TABS
ORAL_TABLET | ORAL | Status: AC
  Filled 2017-03-17: qty 2

## 2017-03-17 MED ORDER — SODIUM CHLORIDE 0.9 % IV SOLN
Freq: Once | INTRAVENOUS | Status: AC
  Administered 2017-03-17: 10:00:00 via INTRAVENOUS

## 2017-03-17 MED ORDER — DIPHENHYDRAMINE HCL 25 MG PO CAPS
ORAL_CAPSULE | ORAL | Status: AC
  Filled 2017-03-17: qty 2

## 2017-03-17 MED ORDER — DIPHENHYDRAMINE HCL 25 MG PO CAPS
50.0000 mg | ORAL_CAPSULE | Freq: Once | ORAL | Status: AC
  Administered 2017-03-17: 50 mg via ORAL

## 2017-03-17 MED ORDER — SODIUM CHLORIDE 0.9% FLUSH
10.0000 mL | INTRAVENOUS | Status: DC | PRN
  Filled 2017-03-17: qty 10

## 2017-03-17 MED ORDER — SODIUM CHLORIDE 0.9 % IV SOLN
375.0000 mg/m2 | Freq: Once | INTRAVENOUS | Status: AC
  Administered 2017-03-17: 600 mg via INTRAVENOUS
  Filled 2017-03-17: qty 50

## 2017-03-17 MED ORDER — HEPARIN SOD (PORK) LOCK FLUSH 100 UNIT/ML IV SOLN
500.0000 [IU] | Freq: Once | INTRAVENOUS | Status: DC | PRN
  Filled 2017-03-17: qty 5

## 2017-03-17 NOTE — Patient Instructions (Signed)
[facility] Discharge Instructions for Patients Receiving Chemotherapy  Today you received the following chemotherapy agents Rituxan  To help prevent nausea and vomiting after your treatment, we encourage you to take your nausea medication    If you develop nausea and vomiting that is not controlled by your nausea medication, call the clinic.   BELOW ARE SYMPTOMS THAT SHOULD BE REPORTED IMMEDIATELY:  *FEVER GREATER THAN 100.5 F  *CHILLS WITH OR WITHOUT FEVER  NAUSEA AND VOMITING THAT IS NOT CONTROLLED WITH YOUR NAUSEA MEDICATION  *UNUSUAL SHORTNESS OF BREATH  *UNUSUAL BRUISING OR BLEEDING  TENDERNESS IN MOUTH AND THROAT WITH OR WITHOUT PRESENCE OF ULCERS  *URINARY PROBLEMS  *BOWEL PROBLEMS  UNUSUAL RASH Items with * indicate a potential emergency and should be followed up as soon as possible.  Feel free to call the clinic you have any questions or concerns. The clinic phone number is [phone].  Please show the [facility] at check-in to the Emergency Department and triage nurse.  Rituximab injection What is this medicine? RITUXIMAB (ri TUX i mab) is a monoclonal antibody. It is used to treat certain types of cancer like non-Hodgkin lymphoma and chronic lymphocytic leukemia. It is also used to treat rheumatoid arthritis, granulomatosis with polyangiitis (or Wegener's granulomatosis), and microscopic polyangiitis. This medicine may be used for other purposes; ask your health care provider or pharmacist if you have questions. COMMON BRAND NAME(S): Rituxan What should I tell my health care provider before I take this medicine? They need to know if you have any of these conditions: -heart disease -infection (especially a virus infection such as hepatitis B, chickenpox, cold sores, or herpes) -immune system problems -irregular heartbeat -kidney disease -lung or breathing disease, like asthma -recently received or scheduled to receive a vaccine -an  unusual or allergic reaction to rituximab, mouse proteins, other medicines, foods, dyes, or preservatives -pregnant or trying to get pregnant -breast-feeding How should I use this medicine? This medicine is for infusion into a vein. It is administered in a hospital or clinic by a specially trained health care professional. A special MedGuide will be given to you by the pharmacist with each prescription and refill. Be sure to read this information carefully each time. Talk to your pediatrician regarding the use of this medicine in children. This medicine is not approved for use in children. Overdosage: If you think you have taken too much of this medicine contact a poison control center or emergency room at once. NOTE: This medicine is only for you. Do not share this medicine with others. What if I miss a dose? It is important not to miss a dose. Call your doctor or health care professional if you are unable to keep an appointment. What may interact with this medicine? -cisplatin -other medicines for arthritis like disease modifying antirheumatic drugs or tumor necrosis factor inhibitors -live virus vaccines This list may not describe all possible interactions. Give your health care provider a list of all the medicines, herbs, non-prescription drugs, or dietary supplements you use. Also tell them if you smoke, drink alcohol, or use illegal drugs. Some items may interact with your medicine. What should I watch for while using this medicine? Your condition will be monitored carefully while you are receiving this medicine. You may need blood work done while you are taking this medicine. This medicine can cause serious allergic reactions. To reduce your risk you may need to take medicine before treatment with this medicine. Take your medicine as directed.  In some patients, this medicine may cause a serious brain infection that may cause death. If you have any problems seeing, thinking, speaking,  walking, or standing, tell your doctor right away. If you cannot reach your doctor, urgently seek other source of medical care. Call your doctor or health care professional for advice if you get a fever, chills or sore throat, or other symptoms of a cold or flu. Do not treat yourself. This drug decreases your body's ability to fight infections. Try to avoid being around people who are sick. Do not become pregnant while taking this medicine or for 12 months after stopping it. Women should inform their doctor if they wish to become pregnant or think they might be pregnant. There is a potential for serious side effects to an unborn child. Talk to your health care professional or pharmacist for more information. What side effects may I notice from receiving this medicine? Side effects that you should report to your doctor or health care professional as soon as possible: -breathing problems -chest pain -dizziness or feeling faint -fast, irregular heartbeat -low blood counts - this medicine may decrease the number of white blood cells, red blood cells and platelets. You may be at increased risk for infections and bleeding. -mouth sores -redness, blistering, peeling or loosening of the skin, including inside the mouth (this can be added for any serious or exfoliative rash that could lead to hospitalization) -signs of infection - fever or chills, cough, sore throat, pain or difficulty passing urine -signs and symptoms of kidney injury like trouble passing urine or change in the amount of urine -signs and symptoms of liver injury like dark yellow or brown urine; general ill feeling or flu-like symptoms; light-colored stools; loss of appetite; nausea; right upper belly pain; unusually weak or tired; yellowing of the eyes or skin -stomach pain -vomiting Side effects that usually do not require medical attention (report to your doctor or health care professional if they continue or are  bothersome): -headache -joint pain -muscle cramps or muscle pain This list may not describe all possible side effects. Call your doctor for medical advice about side effects. You may report side effects to FDA at 1-800-FDA-1088. Where should I keep my medicine? This drug is given in a hospital or clinic and will not be stored at home. NOTE: This sheet is a summary. It may not cover all possible information. If you have questions about this medicine, talk to your doctor, pharmacist, or health care provider.  2018 Elsevier/Gold Standard (2016-02-18 15:28:09)

## 2017-03-22 DIAGNOSIS — Z760 Encounter for issue of repeat prescription: Secondary | ICD-10-CM | POA: Diagnosis not present

## 2017-03-24 ENCOUNTER — Other Ambulatory Visit (HOSPITAL_BASED_OUTPATIENT_CLINIC_OR_DEPARTMENT_OTHER): Payer: 59

## 2017-03-24 ENCOUNTER — Ambulatory Visit (HOSPITAL_BASED_OUTPATIENT_CLINIC_OR_DEPARTMENT_OTHER): Payer: 59

## 2017-03-24 ENCOUNTER — Telehealth: Payer: Self-pay

## 2017-03-24 VITALS — BP 108/74 | HR 79 | Temp 99.0°F | Resp 18

## 2017-03-24 DIAGNOSIS — Z5112 Encounter for antineoplastic immunotherapy: Secondary | ICD-10-CM

## 2017-03-24 DIAGNOSIS — D693 Immune thrombocytopenic purpura: Secondary | ICD-10-CM

## 2017-03-24 DIAGNOSIS — D62 Acute posthemorrhagic anemia: Secondary | ICD-10-CM

## 2017-03-24 LAB — CBC WITH DIFFERENTIAL/PLATELET
BASO%: 3.1 % — AB (ref 0.0–2.0)
Basophils Absolute: 0.5 10*3/uL — ABNORMAL HIGH (ref 0.0–0.1)
EOS%: 21.4 % — ABNORMAL HIGH (ref 0.0–7.0)
Eosinophils Absolute: 3.2 10*3/uL — ABNORMAL HIGH (ref 0.0–0.5)
HEMATOCRIT: 32.4 % — AB (ref 34.8–46.6)
HEMOGLOBIN: 10.3 g/dL — AB (ref 11.6–15.9)
LYMPH#: 3.3 10*3/uL (ref 0.9–3.3)
LYMPH%: 22.5 % (ref 14.0–49.7)
MCH: 30.5 pg (ref 25.1–34.0)
MCHC: 31.8 g/dL (ref 31.5–36.0)
MCV: 95.9 fL (ref 79.5–101.0)
MONO#: 0.8 10*3/uL (ref 0.1–0.9)
MONO%: 5.4 % (ref 0.0–14.0)
NEUT%: 47.6 % (ref 38.4–76.8)
NEUTROS ABS: 7 10*3/uL — AB (ref 1.5–6.5)
Platelets: 256 10*3/uL (ref 145–400)
RBC: 3.38 10*6/uL — ABNORMAL LOW (ref 3.70–5.45)
RDW: 14.4 % (ref 11.2–14.5)
WBC: 14.7 10*3/uL — AB (ref 3.9–10.3)
nRBC: 0 % (ref 0–0)

## 2017-03-24 MED ORDER — ACETAMINOPHEN 325 MG PO TABS
650.0000 mg | ORAL_TABLET | Freq: Once | ORAL | Status: AC
  Administered 2017-03-24: 650 mg via ORAL

## 2017-03-24 MED ORDER — ACETAMINOPHEN 325 MG PO TABS
ORAL_TABLET | ORAL | Status: AC
  Filled 2017-03-24: qty 2

## 2017-03-24 MED ORDER — RITUXIMAB CHEMO INJECTION 500 MG/50ML
375.0000 mg/m2 | Freq: Once | INTRAVENOUS | Status: AC
  Administered 2017-03-24: 600 mg via INTRAVENOUS
  Filled 2017-03-24: qty 50

## 2017-03-24 MED ORDER — DIPHENHYDRAMINE HCL 25 MG PO CAPS
ORAL_CAPSULE | ORAL | Status: AC
  Filled 2017-03-24: qty 2

## 2017-03-24 MED ORDER — DIPHENHYDRAMINE HCL 25 MG PO CAPS
50.0000 mg | ORAL_CAPSULE | Freq: Once | ORAL | Status: AC
  Administered 2017-03-24: 50 mg via ORAL

## 2017-03-24 MED ORDER — SODIUM CHLORIDE 0.9 % IV SOLN
Freq: Once | INTRAVENOUS | Status: AC
  Administered 2017-03-24: 11:00:00 via INTRAVENOUS

## 2017-03-24 NOTE — Telephone Encounter (Signed)
Pt called about fmla papers that were already completed. She went back to work this week.  She only worked Monday and was unable to go back Wednesday. She was tired, drained, fatigued. The fmla papers state she can work 24 hours (or 3 days per week).  She has appt with Dr Alen Blew next Thursday.  She has already spoken with [employer] disability people and is cleared with them. She is letting Dr Alen Blew be aware of situation.

## 2017-03-24 NOTE — Patient Instructions (Signed)
[facility] Discharge Instructions for Patients Receiving Chemotherapy  Today you received the following chemotherapy agents Rituxan  To help prevent nausea and vomiting after your treatment, we encourage you to take your nausea medication    If you develop nausea and vomiting that is not controlled by your nausea medication, call the clinic.   BELOW ARE SYMPTOMS THAT SHOULD BE REPORTED IMMEDIATELY:  *FEVER GREATER THAN 100.5 F  *CHILLS WITH OR WITHOUT FEVER  NAUSEA AND VOMITING THAT IS NOT CONTROLLED WITH YOUR NAUSEA MEDICATION  *UNUSUAL SHORTNESS OF BREATH  *UNUSUAL BRUISING OR BLEEDING  TENDERNESS IN MOUTH AND THROAT WITH OR WITHOUT PRESENCE OF ULCERS  *URINARY PROBLEMS  *BOWEL PROBLEMS  UNUSUAL RASH Items with * indicate a potential emergency and should be followed up as soon as possible.  Feel free to call the clinic you have any questions or concerns. The clinic phone number is [phone].  Please show the [place] at check-in to the Emergency Department and triage nurse.  Rituximab injection What is this medicine? RITUXIMAB (ri TUX i mab) is a monoclonal antibody. It is used to treat certain types of cancer like non-Hodgkin lymphoma and chronic lymphocytic leukemia. It is also used to treat rheumatoid arthritis, granulomatosis with polyangiitis (or Wegener's granulomatosis), and microscopic polyangiitis. This medicine may be used for other purposes; ask your health care provider or pharmacist if you have questions. COMMON BRAND NAME(S): Rituxan What should I tell my health care provider before I take this medicine? They need to know if you have any of these conditions: -heart disease -infection (especially a virus infection such as hepatitis B, chickenpox, cold sores, or herpes) -immune system problems -irregular heartbeat -kidney disease -lung or breathing disease, like asthma -recently received or scheduled to receive a vaccine -an  unusual or allergic reaction to rituximab, mouse proteins, other medicines, foods, dyes, or preservatives -pregnant or trying to get pregnant -breast-feeding How should I use this medicine? This medicine is for infusion into a vein. It is administered in a hospital or clinic by a specially trained health care professional. A special MedGuide will be given to you by the pharmacist with each prescription and refill. Be sure to read this information carefully each time. Talk to your pediatrician regarding the use of this medicine in children. This medicine is not approved for use in children. Overdosage: If you think you have taken too much of this medicine contact a poison control center or emergency room at once. NOTE: This medicine is only for you. Do not share this medicine with others. What if I miss a dose? It is important not to miss a dose. Call your doctor or health care professional if you are unable to keep an appointment. What may interact with this medicine? -cisplatin -other medicines for arthritis like disease modifying antirheumatic drugs or tumor necrosis factor inhibitors -live virus vaccines This list may not describe all possible interactions. Give your health care provider a list of all the medicines, herbs, non-prescription drugs, or dietary supplements you use. Also tell them if you smoke, drink alcohol, or use illegal drugs. Some items may interact with your medicine. What should I watch for while using this medicine? Your condition will be monitored carefully while you are receiving this medicine. You may need blood work done while you are taking this medicine. This medicine can cause serious allergic reactions. To reduce your risk you may need to take medicine before treatment with this medicine. Take your medicine as directed.  In some patients, this medicine may cause a serious brain infection that may cause death. If you have any problems seeing, thinking, speaking,  walking, or standing, tell your doctor right away. If you cannot reach your doctor, urgently seek other source of medical care. Call your doctor or health care professional for advice if you get a fever, chills or sore throat, or other symptoms of a cold or flu. Do not treat yourself. This drug decreases your body's ability to fight infections. Try to avoid being around people who are sick. Do not become pregnant while taking this medicine or for 12 months after stopping it. Women should inform their doctor if they wish to become pregnant or think they might be pregnant. There is a potential for serious side effects to an unborn child. Talk to your health care professional or pharmacist for more information. What side effects may I notice from receiving this medicine? Side effects that you should report to your doctor or health care professional as soon as possible: -breathing problems -chest pain -dizziness or feeling faint -fast, irregular heartbeat -low blood counts - this medicine may decrease the number of white blood cells, red blood cells and platelets. You may be at increased risk for infections and bleeding. -mouth sores -redness, blistering, peeling or loosening of the skin, including inside the mouth (this can be added for any serious or exfoliative rash that could lead to hospitalization) -signs of infection - fever or chills, cough, sore throat, pain or difficulty passing urine -signs and symptoms of kidney injury like trouble passing urine or change in the amount of urine -signs and symptoms of liver injury like dark yellow or brown urine; general ill feeling or flu-like symptoms; light-colored stools; loss of appetite; nausea; right upper belly pain; unusually weak or tired; yellowing of the eyes or skin -stomach pain -vomiting Side effects that usually do not require medical attention (report to your doctor or health care professional if they continue or are  bothersome): -headache -joint pain -muscle cramps or muscle pain This list may not describe all possible side effects. Call your doctor for medical advice about side effects. You may report side effects to FDA at 1-800-FDA-1088. Where should I keep my medicine? This drug is given in a hospital or clinic and will not be stored at home. NOTE: This sheet is a summary. It may not cover all possible information. If you have questions about this medicine, talk to your doctor, pharmacist, or health care provider.  2018 Elsevier/Gold Standard (2016-02-18 15:28:09)

## 2017-03-31 ENCOUNTER — Ambulatory Visit (HOSPITAL_BASED_OUTPATIENT_CLINIC_OR_DEPARTMENT_OTHER): Payer: 59

## 2017-03-31 ENCOUNTER — Other Ambulatory Visit (HOSPITAL_BASED_OUTPATIENT_CLINIC_OR_DEPARTMENT_OTHER): Payer: 59

## 2017-03-31 ENCOUNTER — Telehealth: Payer: Self-pay

## 2017-03-31 ENCOUNTER — Ambulatory Visit (HOSPITAL_BASED_OUTPATIENT_CLINIC_OR_DEPARTMENT_OTHER): Payer: 59 | Admitting: Oncology

## 2017-03-31 VITALS — BP 104/66 | HR 85 | Temp 98.9°F | Resp 17

## 2017-03-31 VITALS — BP 116/77 | HR 87 | Temp 98.4°F | Resp 18 | Ht 66.0 in | Wt 145.1 lb

## 2017-03-31 DIAGNOSIS — D693 Immune thrombocytopenic purpura: Secondary | ICD-10-CM

## 2017-03-31 DIAGNOSIS — D72829 Elevated white blood cell count, unspecified: Secondary | ICD-10-CM

## 2017-03-31 DIAGNOSIS — D508 Other iron deficiency anemias: Secondary | ICD-10-CM

## 2017-03-31 DIAGNOSIS — D500 Iron deficiency anemia secondary to blood loss (chronic): Secondary | ICD-10-CM

## 2017-03-31 DIAGNOSIS — K529 Noninfective gastroenteritis and colitis, unspecified: Secondary | ICD-10-CM | POA: Diagnosis not present

## 2017-03-31 DIAGNOSIS — Z5112 Encounter for antineoplastic immunotherapy: Secondary | ICD-10-CM

## 2017-03-31 DIAGNOSIS — D721 Eosinophilia: Secondary | ICD-10-CM

## 2017-03-31 DIAGNOSIS — D62 Acute posthemorrhagic anemia: Secondary | ICD-10-CM

## 2017-03-31 LAB — CBC WITH DIFFERENTIAL/PLATELET
BASO%: 4.4 % — ABNORMAL HIGH (ref 0.0–2.0)
BASOS ABS: 0.3 10*3/uL — AB (ref 0.0–0.1)
EOS%: 15.5 % — AB (ref 0.0–7.0)
Eosinophils Absolute: 1.2 10*3/uL — ABNORMAL HIGH (ref 0.0–0.5)
HCT: 33.5 % — ABNORMAL LOW (ref 34.8–46.6)
HGB: 10.7 g/dL — ABNORMAL LOW (ref 11.6–15.9)
LYMPH%: 27.2 % (ref 14.0–49.7)
MCH: 30.1 pg (ref 25.1–34.0)
MCHC: 31.9 g/dL (ref 31.5–36.0)
MCV: 94.4 fL (ref 79.5–101.0)
MONO#: 0.4 10*3/uL (ref 0.1–0.9)
MONO%: 4.8 % (ref 0.0–14.0)
NEUT#: 3.6 10*3/uL (ref 1.5–6.5)
NEUT%: 48.1 % (ref 38.4–76.8)
NRBC: 0 % (ref 0–0)
Platelets: 325 10*3/uL (ref 145–400)
RBC: 3.55 10*6/uL — AB (ref 3.70–5.45)
RDW: 13.6 % (ref 11.2–14.5)
WBC: 7.5 10*3/uL (ref 3.9–10.3)
lymph#: 2 10*3/uL (ref 0.9–3.3)

## 2017-03-31 MED ORDER — DIPHENHYDRAMINE HCL 25 MG PO CAPS
50.0000 mg | ORAL_CAPSULE | Freq: Once | ORAL | Status: AC
  Administered 2017-03-31: 50 mg via ORAL

## 2017-03-31 MED ORDER — ACETAMINOPHEN 325 MG PO TABS
650.0000 mg | ORAL_TABLET | Freq: Once | ORAL | Status: AC
  Administered 2017-03-31: 650 mg via ORAL

## 2017-03-31 MED ORDER — SODIUM CHLORIDE 0.9 % IV SOLN
375.0000 mg/m2 | Freq: Once | INTRAVENOUS | Status: AC
  Administered 2017-03-31: 600 mg via INTRAVENOUS
  Filled 2017-03-31: qty 50

## 2017-03-31 MED ORDER — SODIUM CHLORIDE 0.9 % IV SOLN
Freq: Once | INTRAVENOUS | Status: AC
  Administered 2017-03-31: 11:00:00 via INTRAVENOUS

## 2017-03-31 MED ORDER — ACETAMINOPHEN 325 MG PO TABS
ORAL_TABLET | ORAL | Status: AC
  Filled 2017-03-31: qty 2

## 2017-03-31 MED ORDER — DIPHENHYDRAMINE HCL 25 MG PO CAPS
ORAL_CAPSULE | ORAL | Status: AC
  Filled 2017-03-31: qty 2

## 2017-03-31 NOTE — Progress Notes (Signed)
Hematology and Oncology Follow Up Visit  Jennifer Abbott [id number] Jan 19, 1981 36 y.o. 03/31/2017 10:28 AM Jennifer Stade, PA-CBreeback, Royetta Car, PA-C   Principle Diagnosis: 36 year old woman with ITP diagnosed on 02/22/2017. She presented with platelet count of less than 5000 and symptoms of colitis.   Prior Therapy:  She was treated with prednisone 60 mg daily started on 02/22/2017. She is status post IVIG 1 g/kg given for total of 2 days completed on 02/23/2017. This was repeated was repeated on August 9 and the 10th of 2018.  Current therapy:  Rituximab 375 mg/m for 4 weekly treatments started on 03/10/2017. Today is her fourth treatment.  Interim History:  [name] presents today for a follow-up visit. Since the last visit, she received to repeat IVIG and tolerated it very well. Her platelet count improved dramatically and response to that on 03/07/2017 to platelet count of 800. She underwent a colonoscopy which showed mild colitis and her prednisone had been tapered to off at this time.  She has been receiving weekly rituximab and have received 3 total treatments. She tolerated treatment very well without any complications. She denied any infusion-related issues such as hives, arthralgias or anaphylaxis. Her bleeding has completely stopped with any evident of hematochezia or petechiae. He is not reporting any rash or ecchymosis. He is back working as part time.  She does not report any headaches, blurry vision, syncope or seizures. She does not report any fevers or chills or sweats. She does not report any cough, wheezing or hemoptysis. She does not report any nausea, vomiting or abdominal pain. She does not report any hematuria or dysuria. Remaining review of systems unremarkable.  Medications: I have reviewed the patient's current medications.  Current Outpatient Prescriptions  Medication Sig Dispense Refill  . atenolol (TENORMIN) 25 MG tablet Take 1.5 tablets (37.5 mg total) by  mouth daily. 90 tablet 3  . citalopram (CELEXA) 40 MG tablet Take 0.5 tablets (20 mg total) by mouth daily. 90 tablet 0  . drospirenone-ethinyl estradiol (YAZ,GIANVI,LORYNA) 3-0.02 MG tablet Take 1 tablet by mouth daily. Please fill Gianvi only. Skip placebos. 3 Package 4  . Mesalamine (ASACOL HD) 800 MG TBEC Take 2 tablets (1,600 mg total) by mouth 2 (two) times daily. 120 tablet 2  . Prenatal Vit-Fe Fumarate-FA (PRENATAL VITAMIN PLUS LOW IRON) 27-1 MG TABS Take 1 tablet by mouth daily. 90 tablet 3  . rizatriptan (MAXALT-MLT) 5 MG disintegrating tablet Take 1 tablet (5 mg total) by mouth as needed for migraine. May repeat in 2 hours if needed 10 tablet 11  . ferrous sulfate 325 (65 FE) MG tablet Take 1 tablet (325 mg total) by mouth daily. (Patient not taking: Reported on 03/31/2017) 30 tablet 3   Current Facility-Administered Medications  Medication Dose Route Frequency Provider Last Rate Last Dose  . 0.9 %  sodium chloride infusion  500 mL Intravenous Continuous Doran Stabler, MD         Allergies:  Allergies  Allergen Reactions  . Sulfa Antibiotics     Excessive yeast infection overload.  . Topamax [Topiramate]     Extreme taste changes.     Past Medical History, Surgical history, Social history, and Family History were reviewed and updated.  Physical Exam: Blood pressure 116/77, pulse 87, temperature 98.4 F (36.9 C), temperature source Oral, resp. rate 18, height 5\' 6"  (1.676 m), weight 145 lb 1.6 oz (65.8 kg), SpO2 100 %. ECOG: 0 General appearance: Well-appearing woman without distress.. Head:  Normocephalic, without obvious abnormality. No oral ulcers or lesions. Neck: no adenopathy thyroid masses. Lymph nodes: Cervical, supraclavicular, and axillary nodes normal. Heart:regular rate and rhythm, S1, S2 normal, no murmur, click, rub or gallop Lung:chest clear, no wheezing, rales, normal symmetric air entry. Abdomin: soft, non-tender, without masses or organomegaly EXT:no  erythema, induration, or nodules Skin: No ecchymosis or rash.  Lab Results: Lab Results  Component Value Date   WBC 7.5 03/31/2017   HGB 10.7 (L) 03/31/2017   HCT 33.5 (L) 03/31/2017   MCV 94.4 03/31/2017   PLT 325 03/31/2017     Chemistry      Component Value Date/Time   NA 140 02/22/2017 0454   K 3.2 (L) 02/22/2017 0454   CL 112 (H) 02/22/2017 0454   CO2 24 02/22/2017 0454   BUN 11 02/22/2017 0454   CREATININE 0.63 02/22/2017 0454   CREATININE 0.74 02/14/2017 0914      Component Value Date/Time   CALCIUM 7.7 (L) 02/22/2017 0454   ALKPHOS 42 02/19/2017 2038   AST 10 (L) 02/19/2017 2038   ALT 9 (L) 02/19/2017 2038   BILITOT 0.2 (L) 02/19/2017 2038       Impression and Plan:  36 year old woman with the following issues:  1. 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 She responded initially to prednisone and IVIG with a complete response on 02/22/2017. She subsequently had a relapse with a platelet count of 6000 on 02/28/2017.  She is status post repeat IVIG treatment in August 2018 with an excellent response with a platelet count of 800,000.  She is currently completing 4 weeks of rituximab and today is her last treatment. She tolerated this treatment very well and appears to have excellent response with a platelet count of 300,000.  Upon completing rituximab, she will continue on active surveillance with a CBC every 2 weeks and will have an M.D. follow-up in 2 months.  2. Leukocytosis and eosinophilia: Appears to be related to her autoimmune disorder. Appears to be resolving at this time.   3. Colitis: her colonoscopy showed mild colitis and she has no symptoms at this time. Prednisone has been completely tapered off.  4. Iron deficiency anemia: Related to chronic blood losses and currently on oral iron. Hemoglobin is adequate at this time. I will recheck her iron studies with  the next visit.  5. Follow-up:  CBC every 2 weeks an M.D. flow-up in 2 months.  Zola Button, MD 9/6/201810:28 AM

## 2017-03-31 NOTE — Patient Instructions (Signed)
[facility] Discharge Instructions for Patients Receiving Chemotherapy  Today you received the following chemotherapy agents: Rituxan   To help prevent nausea and vomiting after your treatment, we encourage you to take your nausea medication as directed.    If you develop nausea and vomiting that is not controlled by your nausea medication, call the clinic.   BELOW ARE SYMPTOMS THAT SHOULD BE REPORTED IMMEDIATELY:  *FEVER GREATER THAN 100.5 F  *CHILLS WITH OR WITHOUT FEVER  NAUSEA AND VOMITING THAT IS NOT CONTROLLED WITH YOUR NAUSEA MEDICATION  *UNUSUAL SHORTNESS OF BREATH  *UNUSUAL BRUISING OR BLEEDING  TENDERNESS IN MOUTH AND THROAT WITH OR WITHOUT PRESENCE OF ULCERS  *URINARY PROBLEMS  *BOWEL PROBLEMS  UNUSUAL RASH Items with * indicate a potential emergency and should be followed up as soon as possible.  Feel free to call the clinic you have any questions or concerns. The clinic phone number is [phone].  Please show the CHEMO ALERT CARD at check-in to the Emergency Department and triage nurse.   

## 2017-03-31 NOTE — Telephone Encounter (Signed)
Patient declined avs and calender. Per 9/6 los

## 2017-04-04 ENCOUNTER — Encounter: Payer: Self-pay | Admitting: Physician Assistant

## 2017-04-04 DIAGNOSIS — K51019 Ulcerative (chronic) pancolitis with unspecified complications: Secondary | ICD-10-CM | POA: Insufficient documentation

## 2017-04-14 ENCOUNTER — Other Ambulatory Visit: Payer: Self-pay

## 2017-04-14 DIAGNOSIS — D693 Immune thrombocytopenic purpura: Secondary | ICD-10-CM | POA: Diagnosis not present

## 2017-04-14 DIAGNOSIS — D62 Acute posthemorrhagic anemia: Secondary | ICD-10-CM

## 2017-04-14 NOTE — Addendum Note (Signed)
Addended by: Narda Rutherford on: 04/14/2017 01:30 PM   Modules accepted: Orders

## 2017-04-15 LAB — CBC WITH DIFFERENTIAL/PLATELET
BASOS ABS: 150 {cells}/uL (ref 0–200)
BASOS PCT: 1.9 %
EOS PCT: 3 %
Eosinophils Absolute: 237 cells/uL (ref 15–500)
HEMATOCRIT: 33.8 % — AB (ref 35.0–45.0)
HEMOGLOBIN: 11.2 g/dL — AB (ref 11.7–15.5)
LYMPHS ABS: 2520 {cells}/uL (ref 850–3900)
MCH: 29.2 pg (ref 27.0–33.0)
MCHC: 33.1 g/dL (ref 32.0–36.0)
MCV: 88.3 fL (ref 80.0–100.0)
MONOS PCT: 8 %
MPV: 11.3 fL (ref 7.5–12.5)
NEUTROS ABS: 4361 {cells}/uL (ref 1500–7800)
Neutrophils Relative %: 55.2 %
Platelets: 362 10*3/uL (ref 140–400)
RBC: 3.83 10*6/uL (ref 3.80–5.10)
RDW: 11.9 % (ref 11.0–15.0)
Total Lymphocyte: 31.9 %
WBC mixed population: 632 cells/uL (ref 200–950)
WBC: 7.9 10*3/uL (ref 3.8–10.8)

## 2017-04-15 NOTE — Progress Notes (Signed)
Pt had labs drawn at PCP.

## 2017-04-18 ENCOUNTER — Encounter: Payer: Self-pay | Admitting: Gastroenterology

## 2017-04-18 ENCOUNTER — Ambulatory Visit (INDEPENDENT_AMBULATORY_CARE_PROVIDER_SITE_OTHER): Payer: 59 | Admitting: Gastroenterology

## 2017-04-18 VITALS — BP 106/64 | HR 88 | Ht 65.5 in | Wt 147.0 lb

## 2017-04-18 DIAGNOSIS — D693 Immune thrombocytopenic purpura: Secondary | ICD-10-CM | POA: Diagnosis not present

## 2017-04-18 DIAGNOSIS — K5100 Ulcerative (chronic) pancolitis without complications: Secondary | ICD-10-CM | POA: Diagnosis not present

## 2017-04-18 NOTE — Progress Notes (Signed)
[facility] Progress Note  Chief Complaint: Ulcerative colitis  Subjective  History:  Jennifer Abbott follows up for her ulcerative colitis and ITP. I reviewed recent hematology notes, and she has finished her 4 week course of rituximab. She tolerated it well, and so far it has worked with a stable normalization of her platelet count. She still has intermittent loose stool that she feels somewhat worse since starting the Asacol. Jennifer Abbott denies rectal bleeding, her appetite is good and weight stable. ROS: Cardiovascular:  no chest pain Respiratory: no dyspnea  The patient's Past Medical, Family and Social History were reviewed and are on file in the EMR.  Objective:  Med list reviewed  Current Outpatient Prescriptions:  .  atenolol (TENORMIN) 25 MG tablet, Take 1.5 tablets (37.5 mg total) by mouth daily., Disp: 90 tablet, Rfl: 3 .  Biotin 10000 MCG TABS, Take 1 tablet by mouth daily., Disp: , Rfl:  .  citalopram (CELEXA) 40 MG tablet, Take 0.5 tablets (20 mg total) by mouth daily., Disp: 90 tablet, Rfl: 0 .  drospirenone-ethinyl estradiol (YAZ,GIANVI,LORYNA) 3-0.02 MG tablet, Take 1 tablet by mouth daily. Please fill Gianvi only. Skip placebos., Disp: 3 Package, Rfl: 4 .  Mesalamine (ASACOL HD) 800 MG TBEC, Take 2 tablets (1,600 mg total) by mouth 2 (two) times daily. (Patient taking differently: Take 2 tablets by mouth as needed. ), Disp: 120 tablet, Rfl: 2 .  Prenatal Vit-Fe Fumarate-FA (PRENATAL VITAMIN PLUS LOW IRON) 27-1 MG TABS, Take 1 tablet by mouth daily., Disp: 90 tablet, Rfl: 3  Current Facility-Administered Medications:  .  0.9 %  sodium chloride infusion, 500 mL, Intravenous, Continuous, Danis, Estill Cotta III, MD   Vital signs in last 24 hrs: Vitals:   04/18/17 1335  BP: 106/64  Pulse: 88    Physical Exam    HEENT: sclera anicteric, oral mucosa moist without lesions  Neck: supple, no thyromegaly, JVD or lymphadenopathy  Cardiac: RRR without murmurs, S1S2 heard,  no peripheral edema  Pulm: clear to auscultation bilaterally, normal RR and effort noted  Abdomen: soft, no tenderness, with active bowel sounds. No guarding or palpable hepatosplenomegaly.  Skin; warm and dry, no jaundice or rash  Recent Labs:  CBC Latest Ref Rng & Units 04/14/2017 03/31/2017 03/24/2017  WBC 3.8 - 10.8 Thousand/uL 7.9 7.5 14.7(H)  Hemoglobin 11.7 - 15.5 g/dL 11.2(L) 10.7(L) 10.3(L)  Hematocrit 35.0 - 45.0 % 33.8(L) 33.5(L) 32.4(L)  Platelets 140 - 400 Thousand/uL 362 325 256     Radiologic studies: Colon biopsies revealed mild inflammation.   @ASSESSMENTPLANBEGIN @ Assessment: Encounter Diagnoses  Name Primary?  . Ulcerative pancolitis ([place]) Yes  . Acute ITP ([place])     This has been a puzzling clinical course. Her symptoms and CT scan at the time of hospital admission with severe ITP suggested ulcerative pancolitis. However, shortly after that on 40 mg daily of prednisone, she had virtually no active colitis on colonoscopy. At most there was some scattered patchy erythema. Perhaps the CT scan over represented the degree of inflammation. In truth, she is not even taking mesalamine for at least the last week because she felt it was making diarrhea worse. Since she is feeling well, and her platelet counts have responded well, she will stay off Asacol and follow her symptoms. If she has recurrent rectal bleeding, I certainly need to hear about that. She agrees with me that there is probably some mild IBS to account for the diarrhea, but she does not feel the need for  medicines at this point. I have left that offer open if she feels the symptoms warrant that. She will otherwise see me as needed.   Total time 20 minutes, over half spent in counseling and coordination of care.   Nelida Meuse III

## 2017-04-18 NOTE — Patient Instructions (Signed)
If you are age 38 or older, your body mass index should be between 23-30. Your Body mass index is 24.09 kg/m. If this is out of the aforementioned range listed, please consider follow up with your Primary Care Provider.  If you are age 42 or younger, your body mass index should be between 19-25. Your Body mass index is 24.09 kg/m. If this is out of the aformentioned range listed, please consider follow up with your Primary Care Provider.   Please follow up as needed with Dr. Loletha Carrow.  Thank you.

## 2017-04-26 ENCOUNTER — Encounter: Payer: Self-pay | Admitting: Physician Assistant

## 2017-04-26 ENCOUNTER — Ambulatory Visit (INDEPENDENT_AMBULATORY_CARE_PROVIDER_SITE_OTHER): Payer: 59 | Admitting: Physician Assistant

## 2017-04-26 VITALS — BP 119/62 | HR 69 | Wt 149.0 lb

## 2017-04-26 DIAGNOSIS — G43011 Migraine without aura, intractable, with status migrainosus: Secondary | ICD-10-CM

## 2017-04-26 DIAGNOSIS — R5383 Other fatigue: Secondary | ICD-10-CM | POA: Diagnosis not present

## 2017-04-26 DIAGNOSIS — R635 Abnormal weight gain: Secondary | ICD-10-CM | POA: Diagnosis not present

## 2017-04-26 DIAGNOSIS — F3341 Major depressive disorder, recurrent, in partial remission: Secondary | ICD-10-CM

## 2017-04-26 DIAGNOSIS — D693 Immune thrombocytopenic purpura: Secondary | ICD-10-CM

## 2017-04-26 MED ORDER — PHENTERMINE HCL 37.5 MG PO TABS: 37.5000 mg | ORAL_TABLET | Freq: Every day | ORAL | 0 refills | Status: DC

## 2017-04-26 MED ORDER — BUPROPION HCL ER (XL) 150 MG PO TB24: 150.0000 mg | ORAL_TABLET | Freq: Every day | ORAL | 0 refills | Status: DC

## 2017-04-26 MED FILL — buPROPion HCL ER (XL) 150 M: 150 | 30 days supply | Qty: 30 | Fill #0

## 2017-04-26 NOTE — Progress Notes (Signed)
Subjective:    Patient ID: Jennifer Abbott, female    DOB: 30-Jan-1981, 36 y.o.   MRN: [id number]  HPI  Pt is a 36 yo female who presents to the clinic to discuss her weight gain. She recently had colitis with GI bleed and acute ITP. She was on prednisone for about 5 weeks. She has gained about 15-20lbs over this time. She has just started back exercising again and in the gym doing mostly cardio 2-3 times a week. She is taking 1/2 tablet of phentermine but not daily. She wants something to help with weight. Pt is still struggling to get energy back. She does feel lots better than before but wants to make sure labs look good.   Depression is fairly controlled. She wants to come off celexa. She is currently taking 20mg  daily. She has good and bad days. No SI/HC.   Migraines are fairly well controlled. She has trokendi at home but has not started it.   She does need her labs for ITP recheck.   .. Active Ambulatory Problems    Diagnosis Date Noted  . PVC (premature ventricular contraction) 05/28/2013  . IDA (iron deficiency anemia) 06/18/2013  . Abnormal weight gain 04/23/2014  . Depression 09/30/2014  . Intractable migraine without aura and with status migrainosus 09/30/2014  . Keratosis pilaris 11/27/2015  . Acute ITP ([place]) 02/20/2017  . Hematochezia 02/20/2017  . Leukocytosis 02/20/2017  . No energy 04/26/2017   Resolved Ambulatory Problems    Diagnosis Date Noted  . Migraine equivalent 12/07/2012  . Fracture of metatarsal of left foot, closed 05/28/2013  . Paronychia 11/22/2013  . Cystitis 12/20/2013  . Vaginitis and vulvovaginitis 01/23/2014  . Foreign body in skin of finger 02/13/2014  . Skin tag 02/14/2014  . Mood changes ([place]) 09/30/2014  . Palpitations 03/03/2015  . Abnormal weight gain 09/08/2015  . Dental infection 10/24/2015  . Left maxillary sinusitis 10/24/2015  . Infection of tooth 10/27/2015  . Sleeping difficulties 04/16/2016  . Acute abdominal pain 02/14/2017   . Acute blood loss anemia 02/20/2017  . Thrombocytopenia ([place]) 02/20/2017  . Colitis 02/20/2017  . Ulcerative pancolitis with complication ([place]) 83/41/9622   Past Medical History:  Diagnosis Date  . Anemia   . Anxiety   . Blood transfusion without reported diagnosis   . Clotting disorder ([place])   . Depression   . GI bleed       Review of Systems  All other systems reviewed and are negative.      Objective:   Physical Exam  Constitutional: She is oriented to person, place, and time. She appears well-developed and well-nourished.  HENT:  Head: Normocephalic and atraumatic.  Cardiovascular: Normal rate, regular rhythm and normal heart sounds.   Neurological: She is alert and oriented to person, place, and time.  Psychiatric: She has a normal mood and affect. Her behavior is normal.          Assessment & Plan:  Marland KitchenMarland KitchenDiagnoses and all orders for this visit:  Acute ITP ([place]) -     CBC w/Diff/Platelet -     TSH -     Ferritin -     Fe+TIBC+Fer -     VITAMIN D 25 Hydroxy (Vit-D Deficiency, Fractures) -     Vitamin B12 -     Methylmalonic Acid  No energy -     buPROPion (WELLBUTRIN XL) 150 MG 24 hr tablet; Take 1 tablet (150 mg total) by mouth daily. -  CBC w/Diff/Platelet -     TSH -     Ferritin -     Fe+TIBC+Fer -     VITAMIN D 25 Hydroxy (Vit-D Deficiency, Fractures) -     Vitamin B12 -     Methylmalonic Acid  Abnormal weight gain -     buPROPion (WELLBUTRIN XL) 150 MG 24 hr tablet; Take 1 tablet (150 mg total) by mouth daily. -     TSH -     VITAMIN D 25 Hydroxy (Vit-D Deficiency, Fractures) -     Vitamin B12 -     Methylmalonic Acid -     phentermine (ADIPEX-P) 37.5 MG tablet; Take 1 tablet (37.5 mg total) by mouth daily before breakfast.  Recurrent major depressive disorder, in partial remission (HCC) -     buPROPion (WELLBUTRIN XL) 150 MG 24 hr tablet; Take 1 tablet (150 mg total) by mouth daily.  Intractable migraine without aura and with status  migrainosus   .Marland Kitchen Depression screen [facility] 2/9 04/26/2017 07/31/2015  Decreased Interest 1 0  Down, Depressed, Hopeless 0 0  PHQ - 2 Score 1 0   Discussed weight: Marland KitchenMarland KitchenDiscussed low carb diet with 1500 calories and 80g of protein.  Exercising at least 150 minutes a week.  My Fitness Pal could be a Microbiologist.  Continue phentermine for 2 months.  Start wellbutrin for mood and could also help with weight. Discussed side effects.  Taper down off celexa 20mg  every other day for next 2 weeks. Pt did not want 10mg  called in for slower taper.  If migraines start to be more frequent consider starting trokendi.  Follow up in 2 months.   Labs ordered to look at causes for fatigue/weight gain and follow up on ITP.

## 2017-04-27 ENCOUNTER — Other Ambulatory Visit: Payer: Self-pay | Admitting: *Deleted

## 2017-04-27 DIAGNOSIS — I493 Ventricular premature depolarization: Secondary | ICD-10-CM

## 2017-04-27 MED ORDER — ATENOLOL 25 MG PO TABS: 37.5000 mg | ORAL_TABLET | Freq: Every day | ORAL | 3 refills | Status: DC

## 2017-04-27 MED FILL — PHENTERMINE 37.5 MG TABLET: 37.5 | 30 days supply | Qty: 30 | Fill #0

## 2017-04-27 MED FILL — ATENOLOL 25 MG TABLET: 25 | 90 days supply | Qty: 135 | Fill #0

## 2017-04-29 NOTE — Progress Notes (Signed)
FYI on CBC.   Discussed other labs with patient.

## 2017-05-01 LAB — CBC WITH DIFFERENTIAL/PLATELET
BASOS ABS: 108 {cells}/uL (ref 0–200)
Basophils Relative: 1.3 %
EOS PCT: 4.3 %
Eosinophils Absolute: 357 cells/uL (ref 15–500)
HEMATOCRIT: 34.8 % — AB (ref 35.0–45.0)
HEMOGLOBIN: 11.9 g/dL (ref 11.7–15.5)
LYMPHS ABS: 2208 {cells}/uL (ref 850–3900)
MCH: 29.9 pg (ref 27.0–33.0)
MCHC: 34.2 g/dL (ref 32.0–36.0)
MCV: 87.4 fL (ref 80.0–100.0)
MPV: 11.4 fL (ref 7.5–12.5)
Monocytes Relative: 10.9 %
NEUTROS ABS: 4723 {cells}/uL (ref 1500–7800)
Neutrophils Relative %: 56.9 %
Platelets: 368 10*3/uL (ref 140–400)
RBC: 3.98 10*6/uL (ref 3.80–5.10)
RDW: 11.8 % (ref 11.0–15.0)
Total Lymphocyte: 26.6 %
WBC mixed population: 905 cells/uL (ref 200–950)
WBC: 8.3 10*3/uL (ref 3.8–10.8)

## 2017-05-01 LAB — IRON,TIBC AND FERRITIN PANEL
%SAT: 14 % (calc) (ref 11–50)
FERRITIN: 9 ng/mL — AB (ref 10–154)
IRON: 84 ug/dL (ref 40–190)
TIBC: 590 mcg/dL (calc) — ABNORMAL HIGH (ref 250–450)

## 2017-05-01 LAB — VITAMIN D 25 HYDROXY (VIT D DEFICIENCY, FRACTURES): Vit D, 25-Hydroxy: 37 ng/mL (ref 30–100)

## 2017-05-01 LAB — VITAMIN B12: VITAMIN B 12: 611 pg/mL (ref 200–1100)

## 2017-05-01 LAB — TSH: TSH: 0.93 mIU/L

## 2017-05-01 LAB — METHYLMALONIC ACID, SERUM: Methylmalonic Acid, Quant: 269 nmol/L (ref 87–318)

## 2017-05-11 ENCOUNTER — Ambulatory Visit (INDEPENDENT_AMBULATORY_CARE_PROVIDER_SITE_OTHER): Payer: 59 | Admitting: Physician Assistant

## 2017-05-11 VITALS — BP 106/59 | HR 76

## 2017-05-11 DIAGNOSIS — G43011 Migraine without aura, intractable, with status migrainosus: Secondary | ICD-10-CM | POA: Diagnosis not present

## 2017-05-11 MED ORDER — DEXAMETHASONE SODIUM PHOSPHATE 4 MG/ML IJ SOLN
4.0000 mg | Freq: Once | INTRAMUSCULAR | Status: AC
  Administered 2017-05-11: 4 mg via INTRAMUSCULAR

## 2017-05-11 NOTE — Progress Notes (Signed)
S: Patient with history of migraine w/o aura presents today with her usual migraine symptoms. Denies vision change, focal weakness, vomiting, or gait disturbance. She has tried Ibuprofen without relief.  O: Vitals:   05/11/17 1535  BP: (!) 106/59  Pulse: 76   A&P: 1. Intractable migraine without aura and with status migrainosus - dexamethasone (DECADRON) injection 4 mg; Inject 1 mL (4 mg total) into the muscle once.  Follow-up as needed if symptoms worsen or fail to improve

## 2017-05-11 NOTE — Progress Notes (Signed)
Pt here to get an injection for her headache. She was given an injection in L deltoid. Pt tolerated injection well.Audelia Hives [place]

## 2017-05-12 ENCOUNTER — Ambulatory Visit (INDEPENDENT_AMBULATORY_CARE_PROVIDER_SITE_OTHER): Payer: 59 | Admitting: Physician Assistant

## 2017-05-12 ENCOUNTER — Other Ambulatory Visit: Payer: Self-pay | Admitting: Sports Medicine

## 2017-05-12 VITALS — BP 100/67 | HR 67 | Wt 143.0 lb

## 2017-05-12 DIAGNOSIS — D693 Immune thrombocytopenic purpura: Secondary | ICD-10-CM

## 2017-05-12 DIAGNOSIS — G43011 Migraine without aura, intractable, with status migrainosus: Secondary | ICD-10-CM | POA: Diagnosis not present

## 2017-05-12 LAB — CBC
HCT: 37.3 % (ref 35.0–45.0)
Hemoglobin: 12.7 g/dL (ref 11.7–15.5)
MCH: 29.5 pg (ref 27.0–33.0)
MCHC: 34 g/dL (ref 32.0–36.0)
MCV: 86.5 fL (ref 80.0–100.0)
MPV: 10.9 fL (ref 7.5–12.5)
Platelets: 377 Thousand/uL (ref 140–400)
RBC: 4.31 Million/uL (ref 3.80–5.10)
RDW: 11.8 % (ref 11.0–15.0)
WBC: 10.9 10*3/uL — ABNORMAL HIGH (ref 3.8–10.8)

## 2017-05-12 MED ORDER — DEXAMETHASONE SODIUM PHOSPHATE 4 MG/ML IJ SOLN
4.0000 mg | Freq: Once | INTRAMUSCULAR | Status: AC
  Administered 2017-05-12: 4 mg via INTRAMUSCULAR

## 2017-05-12 MED ORDER — KETOROLAC TROMETHAMINE 30 MG/ML IJ SOLN: 30.0000 mg | Freq: Once | INTRAMUSCULAR | 0 refills | Status: AC

## 2017-05-12 MED ORDER — KETOROLAC TROMETHAMINE 30 MG/ML IJ SOLN
30.0000 mg | Freq: Once | INTRAMUSCULAR | Status: AC
  Administered 2017-05-12: 30 mg via INTRAMUSCULAR

## 2017-05-12 NOTE — Progress Notes (Signed)
Patient is here for injections to treat the symptoms of a migraine. Patient received the injections without complication.

## 2017-05-12 NOTE — Progress Notes (Signed)
2-week a CBC drawn today.

## 2017-05-16 ENCOUNTER — Other Ambulatory Visit: Payer: Self-pay | Admitting: Physician Assistant

## 2017-05-16 MED ORDER — BUPROPION HCL ER (XL) 300 MG PO TB24: 300.0000 mg | ORAL_TABLET | Freq: Every day | ORAL | 1 refills | Status: DC

## 2017-05-16 MED FILL — BUPROPION HCL XL 300 MG TAB: 300 | 90 days supply | Qty: 90 | Fill #0

## 2017-05-16 MED FILL — TROKENDI XR 25 MG CAPSULE: 25 | 30 days supply | Qty: 30 | Fill #1

## 2017-05-16 NOTE — Progress Notes (Signed)
Pt is doing well on 300mg  of wellbutrin. She has no side effects. Sent refill.

## 2017-05-26 ENCOUNTER — Other Ambulatory Visit: Payer: Self-pay | Admitting: Physician Assistant

## 2017-05-26 DIAGNOSIS — D693 Immune thrombocytopenic purpura: Secondary | ICD-10-CM

## 2017-05-27 LAB — CBC WITH DIFFERENTIAL/PLATELET
BASOS ABS: 81 {cells}/uL (ref 0–200)
Basophils Relative: 0.9 %
Eosinophils Absolute: 369 cells/uL (ref 15–500)
Eosinophils Relative: 4.1 %
HEMATOCRIT: 35.3 % (ref 35.0–45.0)
HEMOGLOBIN: 12.2 g/dL (ref 11.7–15.5)
LYMPHS ABS: 2421 {cells}/uL (ref 850–3900)
MCH: 29.6 pg (ref 27.0–33.0)
MCHC: 34.6 g/dL (ref 32.0–36.0)
MCV: 85.7 fL (ref 80.0–100.0)
MPV: 11.6 fL (ref 7.5–12.5)
Monocytes Relative: 13.1 %
NEUTROS ABS: 4950 {cells}/uL (ref 1500–7800)
NEUTROS PCT: 55 %
Platelets: 336 10*3/uL (ref 140–400)
RBC: 4.12 10*6/uL (ref 3.80–5.10)
RDW: 12.2 % (ref 11.0–15.0)
Total Lymphocyte: 26.9 %
WBC: 9 10*3/uL (ref 3.8–10.8)
WBCMIX: 1179 {cells}/uL — AB (ref 200–950)

## 2017-06-03 ENCOUNTER — Encounter: Payer: Self-pay | Admitting: Sports Medicine

## 2017-06-07 ENCOUNTER — Ambulatory Visit (HOSPITAL_BASED_OUTPATIENT_CLINIC_OR_DEPARTMENT_OTHER): Payer: 59 | Admitting: Oncology

## 2017-06-07 ENCOUNTER — Other Ambulatory Visit (HOSPITAL_BASED_OUTPATIENT_CLINIC_OR_DEPARTMENT_OTHER): Payer: 59

## 2017-06-07 ENCOUNTER — Telehealth: Payer: Self-pay | Admitting: Oncology

## 2017-06-07 VITALS — BP 114/73 | HR 78 | Temp 98.4°F | Resp 18 | Ht 65.5 in | Wt 143.1 lb

## 2017-06-07 DIAGNOSIS — D72829 Elevated white blood cell count, unspecified: Secondary | ICD-10-CM | POA: Diagnosis not present

## 2017-06-07 DIAGNOSIS — D508 Other iron deficiency anemias: Secondary | ICD-10-CM

## 2017-06-07 DIAGNOSIS — D509 Iron deficiency anemia, unspecified: Secondary | ICD-10-CM

## 2017-06-07 DIAGNOSIS — D721 Eosinophilia: Secondary | ICD-10-CM | POA: Diagnosis not present

## 2017-06-07 DIAGNOSIS — D693 Immune thrombocytopenic purpura: Secondary | ICD-10-CM | POA: Diagnosis not present

## 2017-06-07 LAB — CBC WITH DIFFERENTIAL/PLATELET
BASO%: 0.8 % (ref 0.0–2.0)
BASOS ABS: 0.1 10*3/uL (ref 0.0–0.1)
EOS%: 2.1 % (ref 0.0–7.0)
Eosinophils Absolute: 0.2 10*3/uL (ref 0.0–0.5)
HEMATOCRIT: 37.6 % (ref 34.8–46.6)
HGB: 12.6 g/dL (ref 11.6–15.9)
LYMPH%: 19.2 % (ref 14.0–49.7)
MCH: 29.5 pg (ref 25.1–34.0)
MCHC: 33.5 g/dL (ref 31.5–36.0)
MCV: 88.1 fL (ref 79.5–101.0)
MONO#: 0.6 10*3/uL (ref 0.1–0.9)
MONO%: 7.4 % (ref 0.0–14.0)
NEUT#: 5.7 10*3/uL (ref 1.5–6.5)
NEUT%: 70.5 % (ref 38.4–76.8)
PLATELETS: 328 10*3/uL (ref 145–400)
RBC: 4.27 10*6/uL (ref 3.70–5.45)
RDW: 13 % (ref 11.2–14.5)
WBC: 8.1 10*3/uL (ref 3.9–10.3)
lymph#: 1.6 10*3/uL (ref 0.9–3.3)

## 2017-06-07 LAB — IRON AND TIBC
%SAT: 35 % (ref 21–57)
IRON: 168 ug/dL — AB (ref 41–142)
TIBC: 479 ug/dL — AB (ref 236–444)
UIBC: 311 ug/dL (ref 120–384)

## 2017-06-07 LAB — FERRITIN: FERRITIN: 17 ng/mL (ref 9–269)

## 2017-06-07 NOTE — Progress Notes (Signed)
Hematology and Oncology Follow Up Visit  Jennifer Abbott [id number] 02/16/1981 36 y.o. 06/07/2017 10:44 AM Jennifer Abbott, Jennifer Car, PA-CBreeback, Jennifer Car, PA-C   Principle Diagnosis: 36 year old woman with ITP diagnosed on 02/22/2017. She presented with platelet count of less than 5000 and symptoms of colitis.   Prior Therapy:  She was treated with prednisone 60 mg daily started on 02/22/2017. She is status post IVIG 1 g/kg given for total of 2 days completed on 02/23/2017. This was repeated was repeated on August 9 and the 10th of 2018. Rituximab 375 mg/m for 4 weekly treatments started on 03/10/2017.  He concluded in September 2018 after achieving complete remission.  Current therapy: Observation and surveillance.   Interim History:  Jennifer Abbott presents today for a follow-up visit. Since the last visit, she completed rituximab and achieved a complete response with normalization of her platelets.  She denied any hematochezia, melena, epistaxis or easy bruisability.  Her energy and performance status remains excellent.  Continues to work full-time without any decline in ability to do so.  She does not report any headaches, blurry vision, syncope or seizures. She does not report any fevers or chills or sweats. She does not report any cough, wheezing or hemoptysis. She does not report any nausea, vomiting or abdominal pain. She does not report any hematuria or dysuria. Remaining review of systems unremarkable.  Medications: I have reviewed the patient's current medications.  Current Outpatient Medications  Medication Sig Dispense Refill  . atenolol (TENORMIN) 25 MG tablet Take 1.5 tablets (37.5 mg total) by mouth daily. 90 tablet 3  . Biotin 10000 MCG TABS Take 1 tablet by mouth daily.    Marland Kitchen buPROPion (WELLBUTRIN XL) 300 MG 24 hr tablet Take 1 tablet (300 mg total) by mouth daily. 90 tablet 1  . citalopram (CELEXA) 40 MG tablet Take 0.5 tablets (20 mg total) by mouth daily. 90 tablet 0  .  drospirenone-ethinyl estradiol (YAZ,GIANVI,LORYNA) 3-0.02 MG tablet Take 1 tablet by mouth daily. Please fill Gianvi only. Skip placebos. 3 Package 4  . phentermine (ADIPEX-P) 37.5 MG tablet Take 1 tablet (37.5 mg total) by mouth daily before breakfast. 30 tablet 0  . Prenatal Vit-Fe Fumarate-FA (PRENATAL VITAMIN PLUS LOW IRON) 27-1 MG TABS Take 1 tablet by mouth daily. 90 tablet 3  . Topiramate ER (TROKENDI XR) 25 MG CP24 Take 1 tablet by mouth daily.     No current facility-administered medications for this visit.      Allergies:  Allergies  Allergen Reactions  . Sulfa Antibiotics     Excessive yeast infection overload.  . Topamax [Topiramate]     Extreme taste changes.     Past Medical History, Surgical history, Social history, and Family History were reviewed and updated.  Physical Exam: Blood pressure 114/73, pulse 78, temperature 98.4 F (36.9 C), temperature source Oral, resp. rate 18, height 5' 5.5" (1.664 m), weight 143 lb 1.6 oz (64.9 kg), SpO2 100 %. ECOG: 0 General appearance: Alert, awake woman without distress. Head: Normocephalic, without obvious abnormality. No oral thrush or ulcers. Neck: no adenopathy thyroid masses. Lymph nodes: Cervical, supraclavicular, and axillary nodes normal. Heart:regular rate and rhythm, S1, S2 normal, no murmur, click, rub or gallop Lung:chest clear, no wheezing, rales, normal symmetric air entry. Abdomin: soft, non-tender, without masses or organomegaly no rebound or guarding. EXT:no erythema, induration, or nodules Skin: No ecchymosis or rash.  Lab Results: Lab Results  Component Value Date   WBC 9.0 05/26/2017   HGB 12.2 05/26/2017  HCT 35.3 05/26/2017   MCV 85.7 05/26/2017   PLT 336 05/26/2017     Chemistry      Component Value Date/Time   NA 140 02/22/2017 0454   K 3.2 (L) 02/22/2017 0454   CL 112 (H) 02/22/2017 0454   CO2 24 02/22/2017 0454   BUN 11 02/22/2017 0454   CREATININE 0.63 02/22/2017 0454   CREATININE  0.74 02/14/2017 0914      Component Value Date/Time   CALCIUM 7.7 (L) 02/22/2017 0454   ALKPHOS 42 02/19/2017 2038   AST 10 (L) 02/19/2017 2038   ALT 9 (L) 02/19/2017 2038   BILITOT 0.2 (L) 02/19/2017 2038       Impression and Plan:  36 year old woman with the following issues:  1. 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 She responded initially to prednisone and IVIG with a complete response on 02/22/2017. She subsequently had a relapse with a platelet count of 6000 on 02/28/2017.  She is status post repeat IVIG treatment in August 2018 with an excellent response with a platelet count of 800,000.  She is S/P 4 weeks of rituximab completed in September 2018.  Her platelet count on 05/26/2017 was within normal range without any clinical evidence of relapse.  The plan is to continue with active surveillance and repeat CBC in 3 months and subsequently in 6 months.  2. Leukocytosis and eosinophilia: Appears to be related to her autoimmune disorder. Appears to be resolving at this time.   3. Colitis: She is no longer reporting any symptoms at this time.  She is not on any active treatment and no follow-up with gastroenterology is needed.  4. Iron deficiency anemia: Related to chronic blood losses and currently on oral iron. Hemoglobin is adequate at this time.  Her iron is repeated today.  5. Follow-up: Will be in 3 months.  Jennifer Button, MD 11/13/201810:44 AM

## 2017-06-07 NOTE — Telephone Encounter (Signed)
Scheduled appt per 11/13 los - Gave patient AVS and calender per los.  

## 2017-06-10 ENCOUNTER — Other Ambulatory Visit: Payer: Self-pay | Admitting: *Deleted

## 2017-06-10 MED ORDER — PRENATAL VITAMIN PLUS LOW IRON 27-1 MG PO TABS: 1.0000 | ORAL_TABLET | Freq: Every day | ORAL | 3 refills | Status: DC

## 2017-06-10 MED ORDER — SUMATRIPTAN SUCCINATE 25 MG PO TABS: 25.0000 mg | ORAL_TABLET | ORAL | 3 refills | Status: DC | PRN

## 2017-06-10 MED FILL — GIANVI 3-0.02 MG TABS: 3-0.02 | 63 days supply | Qty: 84 | Fill #2

## 2017-06-10 MED FILL — SUMATRIPTAN SUCC 25 MG TAB: 25 | 30 days supply | Qty: 9 | Fill #0

## 2017-06-10 MED FILL — PRENATAL VITAMIN PLUS LOW I: 27-1 | 90 days supply | Qty: 90 | Fill #0

## 2017-06-21 MED FILL — TROKENDI XR 25 MG CAPSULE: 25 | 30 days supply | Qty: 30 | Fill #2

## 2017-07-29 ENCOUNTER — Ambulatory Visit: Payer: Self-pay | Admitting: Physician Assistant

## 2017-08-17 ENCOUNTER — Ambulatory Visit (INDEPENDENT_AMBULATORY_CARE_PROVIDER_SITE_OTHER): Payer: No Typology Code available for payment source | Admitting: Physician Assistant

## 2017-08-17 ENCOUNTER — Encounter: Payer: Self-pay | Admitting: Physician Assistant

## 2017-08-17 VITALS — BP 102/68 | HR 73 | Temp 98.3°F | Resp 16 | Wt 140.0 lb

## 2017-08-17 DIAGNOSIS — Z8719 Personal history of other diseases of the digestive system: Secondary | ICD-10-CM | POA: Diagnosis not present

## 2017-08-17 DIAGNOSIS — R41840 Attention and concentration deficit: Secondary | ICD-10-CM

## 2017-08-17 DIAGNOSIS — G479 Sleep disorder, unspecified: Secondary | ICD-10-CM | POA: Diagnosis not present

## 2017-08-17 DIAGNOSIS — Z862 Personal history of diseases of the blood and blood-forming organs and certain disorders involving the immune mechanism: Secondary | ICD-10-CM | POA: Diagnosis not present

## 2017-08-17 DIAGNOSIS — K922 Gastrointestinal hemorrhage, unspecified: Secondary | ICD-10-CM | POA: Insufficient documentation

## 2017-08-17 MED ORDER — AMPHETAMINE-DEXTROAMPHET ER 10 MG PO CP24: 10.0000 mg | ORAL_CAPSULE | Freq: Every day | ORAL | 0 refills | Status: DC

## 2017-08-17 MED ORDER — BUPROPION HCL ER (XL) 150 MG PO TB24: 150.0000 mg | ORAL_TABLET | Freq: Every day | ORAL | 0 refills | Status: DC

## 2017-08-17 MED FILL — buPROPion HCL ER (XL) 150 M: 150 | 30 days supply | Qty: 30 | Fill #0

## 2017-08-17 MED FILL — ADDERALL XR 10 MG CAP SA: 10 | 30 days supply | Qty: 30 | Fill #0

## 2017-08-17 NOTE — Progress Notes (Addendum)
Subjective:    Patient ID: Jennifer Abbott, female    DOB: [birth year], 36 y.o.   MRN: [id number]  HPI  Pt is a 37 year old female with a history of MDD and migraines. She is presenting to the clinic with symptoms of in-attention, difficulty focusing, and feeling restless.  She noticed decreasing attention and focus two years ago and was having issues becoming more easily distracted. She denies any life events during that time which led to these symptoms. She often delays starting tasks, is easily distracted,and interrupts others when they are busy. She feels tired and fatigue all the time and has difficulty sleeping during the week. She normally sleeps well during the weekends but finds her thoughts racing and difficulty with sleep during the week.   She denies depression or anxiety that disrupts her life. She has general anxiety regarding her financial and motherhood responsibilities at home. Denies any palpitations.    In her childhood she reports she did okay in school up until 8th grade when her family moved from [place] to [place]. At this time she stopped trying in school in a rebellious manner and did not have interest in education. She was successful in [place] but did other college levels classes in which she struggled with.   She has been trying to lose weight with diet and exercise. She was previously on topamax and wellbutrin for weight loss but wishes to taper off the wellbutrin as she does not find it beneficial.  She does need a referral for oncology due to hx of ITP and needing follow up.   .. Active Ambulatory Problems    Diagnosis Date Noted  . PVC (premature ventricular contraction) 05/28/2013  . Abnormal weight gain 04/23/2014  . Depression 09/30/2014  . Intractable migraine without aura and with status migrainosus 09/30/2014  . History of ITP 02/20/2017  . History of lower GI bleeding 08/17/2017  . Trouble in sleeping 08/17/2017  . Inattention 08/17/2017   Resolved  Ambulatory Problems    Diagnosis Date Noted  . Migraine equivalent 12/07/2012  . Fracture of metatarsal of left foot, closed 05/28/2013  . IDA (iron deficiency anemia) 06/18/2013  . Paronychia 11/22/2013  . Cystitis 12/20/2013  . Vaginitis and vulvovaginitis 01/23/2014  . Foreign body in skin of finger 02/13/2014  . Skin tag 02/14/2014  . Mood changes ([place]) 09/30/2014  . Palpitations 03/03/2015  . Abnormal weight gain 09/08/2015  . Dental infection 10/24/2015  . Left maxillary sinusitis 10/24/2015  . Infection of tooth 10/27/2015  . Keratosis pilaris 11/27/2015  . Sleeping difficulties 04/16/2016  . Acute abdominal pain 02/14/2017  . Hematochezia 02/20/2017  . Acute blood loss anemia 02/20/2017  . Thrombocytopenia ([place]) 02/20/2017  . Colitis 02/20/2017  . Leukocytosis 02/20/2017  . Ulcerative pancolitis with complication ([place]) 62/95/2841  . No energy 04/26/2017  . GI bleed 08/17/2017   Past Medical History:  Diagnosis Date  . Anemia   . Anxiety   . Blood transfusion without reported diagnosis   . Clotting disorder ([place])   . Depression   . GI bleed       Review of Systems  Constitutional: Positive for fatigue. Negative for appetite change, chills and fever.  Cardiovascular: Negative for chest pain and palpitations.       Objective:   Physical Exam  Constitutional: She is oriented to person, place, and time. She appears well-developed and well-nourished. No distress.  HENT:  Head: Normocephalic and atraumatic.  Cardiovascular: Normal rate, regular rhythm and  normal heart sounds. Exam reveals no gallop and no friction rub.  No murmur heard. Pulmonary/Chest: Effort normal and breath sounds normal. No respiratory distress.  Neurological: She is alert and oriented to person, place, and time.  Skin: Skin is warm and dry.  Psychiatric: She has a normal mood and affect. Her behavior is normal.   Vitals:   08/17/17 1541  BP: 102/68  Pulse: 73  Resp: 16  Temp: 98.3  F (36.8 C)     Adult ADHD Self Report Scale (most recent)    Adult ADHD Self-Report Scale (ASRS-v1.1) Symptom Checklist - 08/17/17 1635      Part A   1. How often do you have trouble wrapping up the final details of a project, once the challenging parts have been done?  Often  (Abnormal)   2. How often do you have difficulty getting things done in order when you have to do a task that requires organization?  Often  (Abnormal)     3. How often do you have problems remembering appointments or obligations?  Rarely  4. When you have a task that requires a lot of thought, how often do you avoid or delay getting started?  Very Often  (Abnormal)     5. How often do you fidget or squirm with your hands or feet when you have to sit down for a long time?  Often  (Abnormal)   6. How often do you feel overly active and compelled to do things, like you were driven by a motor?  Sometimes      Part B   7. How often do you make careless mistakes when you have to work on a boring or difficult project?  Sometimes  8. How often do you have difficulty keeping your attention when you are doing boring or repetitive work?  Very Often  (Abnormal)     9. How often do you have difficulty concentrating on what people say to you, even when they are speaking to you directly?  Often  (Abnormal)   10. How often do you misplace or have difficulty finding things at home or at work?  Very Often  (Abnormal)     11. How often are you distracted by activity or noise around you?  Often  (Abnormal)   12. How often do you leave your seat in meetings or other situations in which you are expected to remain seated?  Sometimes  (Abnormal)     13. How often do you feel restless or fidgety?  Very Often  (Abnormal)   14. How often do you have difficulty unwinding and relaxing when you have time to yourself?  Rarely    15. How often do you find yourself talking too much when you are in social situations?  Sometimes  16. When you are in a  conversation, how often do you find yourself finishing the sentences of the people you are talking to, before they can finish them themselves?  Rarely    17. How often do you have difficulty waiting your turn in situations when turn taking is required?  Rarely  18. How often do you interrupt others when they are busy?  Often  (Abnormal)         Depression screen [facility] 2/9 08/17/2017 04/26/2017 07/31/2015  Decreased Interest 0 1 0  Down, Depressed, Hopeless 0 0 0  PHQ - 2 Score 0 1 0  Altered sleeping 3 - -  Tired, decreased energy 3 - -  Change in appetite 0 - -  Feeling bad or failure about yourself  0 - -  Trouble concentrating 1 - -  Moving slowly or fidgety/restless 0 - -  Suicidal thoughts 0 - -  PHQ-9 Score 7 - -   GAD 7 : Generalized Anxiety Score 08/17/2017  Nervous, Anxious, on Edge 0  Control/stop worrying 0  Worry too much - different things 1  Trouble relaxing 0  Restless 0  Easily annoyed or irritable 3  Afraid - awful might happen 0  Total GAD 7 Score 4       Assessment & Plan:  Diagnoses and all orders for this visit:  Inattention -     amphetamine-dextroamphetamine (ADDERALL XR) 10 MG 24 hr capsule; Take 1 capsule (10 mg total) by mouth daily. -     buPROPion (WELLBUTRIN XL) 150 MG 24 hr tablet; Take 1 tablet (150 mg total) by mouth daily.  History of lower GI bleeding -     Ambulatory referral to Oncology  History of ITP -     Ambulatory referral to Oncology  Trouble in sleeping  above screening was positive for ADD. failled wellbutrin.  We discussed treatments for inattention and will began Adderal XR 10mg . Jennifer Abbott will also be tapering off her Wellbutrin as she does not find benefit and would like to stop. Discussed melatonin for sleep. Discussed SE's of addrall. She can follow with me up on MyChart to report to the efficacy of her new medication.   Follow up in 1 month.

## 2017-09-01 MED FILL — ATENOLOL 25 MG TABLET: 25 | 90 days supply | Qty: 135 | Fill #1

## 2017-09-01 MED FILL — GIANVI 3-0.02 MG TABS: 3-0.02 | 63 days supply | Qty: 84 | Fill #3

## 2017-09-05 MED FILL — SUMATRIPTAN SUCC 25 MG TAB: 25 | 30 days supply | Qty: 9 | Fill #1

## 2017-09-14 ENCOUNTER — Telehealth: Payer: Self-pay

## 2017-09-14 ENCOUNTER — Inpatient Hospital Stay: Payer: No Typology Code available for payment source | Attending: Oncology | Admitting: Oncology

## 2017-09-14 ENCOUNTER — Other Ambulatory Visit: Payer: 59

## 2017-09-14 VITALS — BP 121/77 | HR 87 | Temp 98.7°F | Resp 18 | Ht 65.5 in | Wt 140.7 lb

## 2017-09-14 DIAGNOSIS — K529 Noninfective gastroenteritis and colitis, unspecified: Secondary | ICD-10-CM | POA: Diagnosis not present

## 2017-09-14 DIAGNOSIS — D721 Eosinophilia: Secondary | ICD-10-CM | POA: Diagnosis not present

## 2017-09-14 DIAGNOSIS — D509 Iron deficiency anemia, unspecified: Secondary | ICD-10-CM | POA: Insufficient documentation

## 2017-09-14 DIAGNOSIS — D693 Immune thrombocytopenic purpura: Secondary | ICD-10-CM | POA: Insufficient documentation

## 2017-09-14 NOTE — Progress Notes (Signed)
Hematology and Oncology Follow Up Visit  Jennifer Abbott [id number] 1981/03/08 37 y.o. 09/14/2017 9:56 AM Jennifer Abbott, Jennifer Car, PA-CBreeback, Jennifer Car, PA-C   Principle Diagnosis: 38 year old woman with autoimmune thrombocytopenia in the setting of active colitis diagnosed on 02/22/2017. She presented with platelet count of less than 5000.    Prior Therapy:  She was treated with prednisone 60 mg daily started on 02/22/2017. She is status post IVIG 1 g/kg given for total of 2 days completed on 02/23/2017. This was repeated was repeated on August 9 and the 10th of 2018. Rituximab 375 mg/m for 4 weekly treatments started on 03/10/2017.  He concluded in September 2018 after achieving complete remission.  Current therapy: Observation and surveillance.   Interim History:  Jennifer Abbott is here for a follow-up by herself.  She continues to do well without any recent exacerbations of her symptoms.  She denies any hematochezia, melena, petechiae or epistaxis.  She does not take any iron replacement at this time and has not reported any excessive fatigue or tiredness.  He does not report any change in her bowel habits.  She denies any diarrhea or mucus production.  She does not report any headaches, blurry vision, syncope or seizures. She does not report any fevers or chills or sweats. She does not report any cough, wheezing or hemoptysis. She does not report any nausea, vomiting or abdominal pain. She does not report any hematuria or dysuria.  She does not report any musculoskeletal complaints.  She does not report any arthralgias or myalgias.  She does not up any skin rashes or lesions.  He does not report any ecchymosis or lymphadenopathy.  Remaining review of systems is negative.  Medications: I have reviewed the patient's current medications.  Current Outpatient Medications  Medication Sig Dispense Refill  . amphetamine-dextroamphetamine (ADDERALL XR) 10 MG 24 hr capsule Take 1 capsule (10 mg total) by  mouth daily. 30 capsule 0  . atenolol (TENORMIN) 25 MG tablet Take 1.5 tablets (37.5 mg total) by mouth daily. 90 tablet 3  . Biotin 10000 MCG TABS Take 1 tablet by mouth daily.    Marland Kitchen buPROPion (WELLBUTRIN XL) 150 MG 24 hr tablet Take 1 tablet (150 mg total) by mouth daily. 30 tablet 0  . drospirenone-ethinyl estradiol (YAZ,GIANVI,LORYNA) 3-0.02 MG tablet Take 1 tablet by mouth daily. Please fill Gianvi only. Skip placebos. 3 Package 4  . Prenatal Vit-Fe Fumarate-FA (PRENATAL VITAMIN PLUS LOW IRON) 27-1 MG TABS Take 1 tablet daily by mouth. 90 tablet 3  . SUMAtriptan (IMITREX) 25 MG tablet Take 1 tablet (25 mg total) every 2 (two) hours as needed by mouth for migraine. May repeat in 2 hours if headache persists or recurs. (Patient not taking: Reported on 08/17/2017) 9 tablet 3   No current facility-administered medications for this visit.      Allergies:  Allergies  Allergen Reactions  . Sulfa Antibiotics     Excessive yeast infection overload.  . Topamax [Topiramate]     Extreme taste changes.     Past Medical History, Surgical history, Social history, and Family History reviewed and unchanged.  Physical Exam: Blood pressure 121/77, pulse 87, temperature 98.7 F (37.1 C), temperature source Oral, resp. rate 18, height 5' 5.5" (1.664 m), weight 140 lb 11.2 oz (63.8 kg), SpO2 100 %. ECOG: 0 General appearance: Well-appearing woman appeared comfortable. Head: Normocephalic, without obvious abnormality.  Oropharynx: No oral ulcers without any blood blisters noted. Eyes: No scleral icterus. Lymph nodes: Cervical, supraclavicular, and  axillary nodes normal. Heart:regular rate and rhythm, S1, S2 normal, no murmur, click, rub or gallop Lung: Clear to auscultation without wheezes or dullness to percussion.. Abdomin: Soft without any rebound or guarding.  Good bowel sounds in all 4 quadrants. Musculoskeletal: No joint deformity or effusion. Skin: No ecchymosis or rash.  No  petechiae. Neurological: No deficits.  Lab Results: Lab Results  Component Value Date   WBC 8.1 06/07/2017   HGB 12.6 06/07/2017   HCT 37.6 06/07/2017   MCV 88.1 06/07/2017   PLT 328 06/07/2017     Chemistry      Component Value Date/Time   NA 140 02/22/2017 0454   K 3.2 (L) 02/22/2017 0454   CL 112 (H) 02/22/2017 0454   CO2 24 02/22/2017 0454   BUN 11 02/22/2017 0454   CREATININE 0.63 02/22/2017 0454   CREATININE 0.74 02/14/2017 0914      Component Value Date/Time   CALCIUM 7.7 (L) 02/22/2017 0454   ALKPHOS 42 02/19/2017 2038   AST 10 (L) 02/19/2017 2038   ALT 9 (L) 02/19/2017 2038   BILITOT 0.2 (L) 02/19/2017 2038       Impression and Plan:  37 year old woman with the following issues:  1. Immune thrombocytopenia: Presented with a platelet count of 6000 in the setting of active colitis.  She was diagnosed in July 2018.   She received IVIG on 2 separate occasions and rituximab and achieved remission in August 2018.  Her CBC obtained on 09/13/2017 obtained at an outside lab showed a hemoglobin of 11.9, white cell count of 10.2 and platelet count of 334.  The natural course of this illness was discussed today and reviewed in detail.  She appears to have autoimmune thrombocytopenia related to flare of colitis.  This issues have resolved at this time no intervention is needed at this time.  2. Leukocytosis and eosinophilia: Appears to be related to her autoimmune disorder.  Resolved on her CBC today.  3. Colitis: Appears to be in remission without any exacerbation of her symptoms.  4. Iron deficiency anemia: Hemoglobin and MCV is within normal range.  Iron studies can be repeated in the future as needed.  5. Follow-up: Will be in 6 months.  No further hematology follow-up will be needed after that.  15  minutes was spent with the patient face-to-face today.  More than 50% of time was dedicated to patient counseling, education and coordination of her care.  Zola Button, MD 2/20/20199:56 AM

## 2017-09-14 NOTE — Telephone Encounter (Signed)
Printed avs and calender for upcoming appointment. Per 2/20 los

## 2017-09-30 ENCOUNTER — Other Ambulatory Visit: Payer: Self-pay | Admitting: Physician Assistant

## 2017-09-30 DIAGNOSIS — R41840 Attention and concentration deficit: Secondary | ICD-10-CM

## 2017-09-30 MED ORDER — AMPHETAMINE-DEXTROAMPHET ER 10 MG PO CP24: 10.0000 mg | ORAL_CAPSULE | Freq: Every day | ORAL | 0 refills | Status: DC

## 2017-09-30 MED FILL — ADDERALL XR 10 MG CAP SA: 10 | 30 days supply | Qty: 30 | Fill #0

## 2017-10-06 ENCOUNTER — Other Ambulatory Visit: Payer: Self-pay | Admitting: Physician Assistant

## 2017-10-06 MED ORDER — TRAZODONE HCL 50 MG PO TABS
50.0000 mg | ORAL_TABLET | Freq: Every day | ORAL | 1 refills | Status: DC
Start: 2017-10-06 — End: 2017-12-30

## 2017-10-06 MED FILL — traZODone HCL 50 MG TABS: 50 | 30 days supply | Qty: 30 | Fill #0

## 2017-10-06 NOTE — Progress Notes (Signed)
Pt having a lot of problems going to sleep and staying a slep. Sent trazodone to try.

## 2017-10-07 MED FILL — PREDNISOLONE AC 1% EYE DROP: 1 | 25 days supply | Qty: 5 | Fill #0

## 2017-10-24 ENCOUNTER — Encounter: Payer: Self-pay | Admitting: Sports Medicine

## 2017-10-25 ENCOUNTER — Other Ambulatory Visit: Payer: Self-pay | Admitting: Physician Assistant

## 2017-10-25 MED ORDER — CITALOPRAM HYDROBROMIDE 10 MG PO TABS: 10.0000 mg | ORAL_TABLET | Freq: Every day | ORAL | 1 refills | Status: DC

## 2017-10-25 MED FILL — CITALOPRAM HBR 10 MG TABLET: 10 | 30 days supply | Qty: 30 | Fill #0

## 2017-11-21 MED FILL — GIANVI 3-0.02 MG TABS: 3-0.02 | 63 days supply | Qty: 84 | Fill #4

## 2017-11-22 ENCOUNTER — Other Ambulatory Visit: Payer: Self-pay | Admitting: Physician Assistant

## 2017-11-22 DIAGNOSIS — R41840 Attention and concentration deficit: Secondary | ICD-10-CM

## 2017-11-22 MED ORDER — AMPHETAMINE-DEXTROAMPHET ER 10 MG PO CP24: 10.0000 mg | ORAL_CAPSULE | Freq: Every day | ORAL | 0 refills | Status: DC

## 2017-11-22 MED FILL — ADDERALL XR 10 MG CAP SA: 10 | 30 days supply | Qty: 30 | Fill #0

## 2017-11-22 NOTE — Progress Notes (Signed)
Pt doing well. No problems or concerns.

## 2017-11-28 MED FILL — CITALOPRAM HBR 10 MG TABLET: 10 | 30 days supply | Qty: 30 | Fill #1

## 2017-11-28 MED FILL — PRENATAL VITAMIN PLUS LOW I: 27-1 | 90 days supply | Qty: 90 | Fill #1

## 2017-11-28 MED FILL — SUMATRIPTAN SUCC 25 MG TAB: 25 | 30 days supply | Qty: 9 | Fill #2

## 2017-11-29 ENCOUNTER — Ambulatory Visit: Payer: No Typology Code available for payment source | Admitting: Family Medicine

## 2017-12-30 ENCOUNTER — Encounter: Payer: Self-pay | Admitting: Physician Assistant

## 2017-12-30 ENCOUNTER — Ambulatory Visit (INDEPENDENT_AMBULATORY_CARE_PROVIDER_SITE_OTHER): Payer: No Typology Code available for payment source | Admitting: Physician Assistant

## 2017-12-30 VITALS — BP 99/59 | HR 82 | Ht 65.5 in | Wt 142.0 lb

## 2017-12-30 DIAGNOSIS — F330 Major depressive disorder, recurrent, mild: Secondary | ICD-10-CM | POA: Diagnosis not present

## 2017-12-30 DIAGNOSIS — R632 Polyphagia: Secondary | ICD-10-CM | POA: Diagnosis not present

## 2017-12-30 DIAGNOSIS — R41840 Attention and concentration deficit: Secondary | ICD-10-CM | POA: Diagnosis not present

## 2017-12-30 DIAGNOSIS — F411 Generalized anxiety disorder: Secondary | ICD-10-CM

## 2017-12-30 MED ORDER — LISDEXAMFETAMINE DIMESYLATE 30 MG PO CAPS
30.0000 mg | ORAL_CAPSULE | Freq: Every day | ORAL | 0 refills | Status: DC
Start: 2017-12-30 — End: 2018-02-15

## 2017-12-30 MED ORDER — CITALOPRAM HYDROBROMIDE 20 MG PO TABS: 20.0000 mg | ORAL_TABLET | Freq: Every day | ORAL | 1 refills | Status: DC

## 2017-12-30 MED FILL — VYVANSE 30 MG CAPSULE: 30 | 30 days supply | Qty: 30 | Fill #0

## 2017-12-30 MED FILL — CITALOPRAM HBR 20 MG TABLET: 20 | 90 days supply | Qty: 90 | Fill #0

## 2017-12-30 MED FILL — ATENOLOL 25 MG TABLET: 25 | 60 days supply | Qty: 90 | Fill #2

## 2017-12-30 NOTE — Progress Notes (Signed)
Subjective:    Patient ID: Jennifer Abbott, female    DOB: 07-Mar-1981, 37 y.o.   MRN: [id number]  HPI Pt is a 37 yo female who presents to the clinic for follow up.   She is on celexa for depression. It has done "ok" with controlling mood. She has had some recent stressors that have led to more sadness. Her oldest son has not talked to her in 6 months. This leaves her very sad. She denies any SI/HC. Money is also a stressors. She does easily cry. She is not currently exercising.   She is currently on adderall for inattention. She has noticed some benefit but nothing major with focus or getting the job done. She finds herself overeating. She is sure some of her foods choices have to do with mood. She frequently goes home and drinks beer and eat pizza after not eating much all day long. She denies any worsening palpitations. Overall she is frustrated with her weight and mood.   .. Active Ambulatory Problems    Diagnosis Date Noted  . PVC (premature ventricular contraction) 05/28/2013  . Abnormal weight gain 04/23/2014  . Depression 09/30/2014  . Intractable migraine without aura and with status migrainosus 09/30/2014  . History of ITP 02/20/2017  . History of lower GI bleeding 08/17/2017  . Trouble in sleeping 08/17/2017  . Inattention 08/17/2017   Resolved Ambulatory Problems    Diagnosis Date Noted  . Migraine equivalent 12/07/2012  . Fracture of metatarsal of left foot, closed 05/28/2013  . IDA (iron deficiency anemia) 06/18/2013  . Paronychia 11/22/2013  . Cystitis 12/20/2013  . Vaginitis and vulvovaginitis 01/23/2014  . Foreign body in skin of finger 02/13/2014  . Skin tag 02/14/2014  . Mood changes ([place]) 09/30/2014  . Palpitations 03/03/2015  . Abnormal weight gain 09/08/2015  . Dental infection 10/24/2015  . Left maxillary sinusitis 10/24/2015  . Infection of tooth 10/27/2015  . Keratosis pilaris 11/27/2015  . Sleeping difficulties 04/16/2016  . Acute abdominal pain  02/14/2017  . Hematochezia 02/20/2017  . Acute blood loss anemia 02/20/2017  . Thrombocytopenia ([place]) 02/20/2017  . Colitis 02/20/2017  . Leukocytosis 02/20/2017  . Ulcerative pancolitis with complication ([place]) 81/44/8185  . No energy 04/26/2017  . GI bleed 08/17/2017   Past Medical History:  Diagnosis Date  . Anemia   . Anxiety   . Blood transfusion without reported diagnosis   . Clotting disorder ([place])   . Depression   . GI bleed       Review of Systems See HPI.     Objective:   Physical Exam  Constitutional: She is oriented to person, place, and time. She appears well-developed and well-nourished.  HENT:  Head: Normocephalic and atraumatic.  Cardiovascular: Normal rate, regular rhythm and normal heart sounds.  Neurological: She is alert and oriented to person, place, and time.  Psychiatric: She has a normal mood and affect. Her behavior is normal.          Assessment & Plan:  Marland KitchenMarland KitchenAmber was seen today for binge eating.  Diagnoses and all orders for this visit:  Inattention -     lisdexamfetamine (VYVANSE) 30 MG capsule; Take 1 capsule (30 mg total) by mouth daily.  Mild episode of recurrent major depressive disorder (HCC) -     citalopram (CELEXA) 20 MG tablet; Take 1 tablet (20 mg total) by mouth daily.  GAD (generalized anxiety disorder) -     citalopram (CELEXA) 20 MG tablet; Take 1 tablet (20 mg  total) by mouth daily.  Overeating -     lisdexamfetamine (VYVANSE) 30 MG capsule; Take 1 capsule (30 mg total) by mouth daily.  .. Depression screen [facility] 2/9 12/30/2017 12/30/2017 08/17/2017 04/26/2017 07/31/2015  Decreased Interest 3 0 0 1 0  Down, Depressed, Hopeless 1 0 0 0 0  PHQ - 2 Score 4 0 0 1 0  Altered sleeping 3 1 3  - -  Tired, decreased energy 2 1 3  - -  Change in appetite 3 0 0 - -  Feeling bad or failure about yourself  3 0 0 - -  Trouble concentrating 0 0 1 - -  Moving slowly or fidgety/restless 0 0 0 - -  Suicidal thoughts 1 0 0 - -  PHQ-9 Score  16 2 7  - -  Difficult doing work/chores Somewhat difficult Not difficult at all - - -   .Marland Kitchen GAD 7 : Generalized Anxiety Score 12/30/2017 08/17/2017  Nervous, Anxious, on Edge 1 0  Control/stop worrying 1 0  Worry too much - different things 1 1  Trouble relaxing 0 0  Restless 0 0  Easily annoyed or irritable 3 3  Afraid - awful might happen 0 0  Total GAD 7 Score 6 4  Anxiety Difficulty Somewhat difficult -    Increased celexa to 20mg .hopefully this could help with some uncontrolled depression/anxiety. Stop adderall. Start vyvanse. Will see if that helps focus/overeating/binge eating. Follow up in 1 month.

## 2018-01-09 ENCOUNTER — Encounter: Payer: No Typology Code available for payment source | Admitting: Physician Assistant

## 2018-01-11 ENCOUNTER — Encounter: Payer: Self-pay | Admitting: Physician Assistant

## 2018-01-11 ENCOUNTER — Ambulatory Visit (INDEPENDENT_AMBULATORY_CARE_PROVIDER_SITE_OTHER): Payer: No Typology Code available for payment source | Admitting: Physician Assistant

## 2018-01-11 VITALS — BP 118/69 | HR 71 | Wt 139.0 lb

## 2018-01-11 DIAGNOSIS — Z13220 Encounter for screening for lipoid disorders: Secondary | ICD-10-CM | POA: Diagnosis not present

## 2018-01-11 DIAGNOSIS — Z0000 Encounter for general adult medical examination without abnormal findings: Secondary | ICD-10-CM | POA: Diagnosis not present

## 2018-01-11 DIAGNOSIS — Z131 Encounter for screening for diabetes mellitus: Secondary | ICD-10-CM

## 2018-01-11 NOTE — Patient Instructions (Signed)

## 2018-01-11 NOTE — Progress Notes (Signed)
Subjective:     Jennifer Abbott is a 37 y.o. female and is here for a comprehensive physical exam. The patient reports no problems.  She is doing well on vyvanse. She feels she is not overeating. She denies any palpitations outside her normal, she is sleeping ok, no CP. Her focus has improved from adderall as well.   Social History   Socioeconomic History  . Marital status: Single    Spouse name: Not on file  . Number of children: Not on file  . Years of education: Not on file  . Highest education level: Not on file  Occupational History  . Not on file  Social Needs  . Financial resource strain: Not on file  . Food insecurity:    Worry: Not on file    Inability: Not on file  . Transportation needs:    Medical: Not on file    Non-medical: Not on file  Tobacco Use  . Smoking status: Former Research scientist (life sciences)  . Smokeless tobacco: Never Used  Substance and Sexual Activity  . Alcohol use: Yes    Alcohol/week: 0.0 oz    Comment: rare  . Drug use: Not on file  . Sexual activity: Never  Lifestyle  . Physical activity:    Days per week: Not on file    Minutes per session: Not on file  . Stress: Not on file  Relationships  . Social connections:    Talks on phone: Not on file    Gets together: Not on file    Attends religious service: Not on file    Active member of club or organization: Not on file    Attends meetings of clubs or organizations: Not on file    Relationship status: Not on file  . Intimate partner violence:    Fear of current or ex partner: Not on file    Emotionally abused: Not on file    Physically abused: Not on file    Forced sexual activity: Not on file  Other Topics Concern  . Not on file  Social History Narrative  . Not on file   Health Maintenance  Topic Date Due  . PAP SMEAR  04/25/2018 (Originally 01/24/2016)  . INFLUENZA VACCINE  02/23/2018  . TETANUS/TDAP  07/26/2021  . HIV Screening  Completed    The following portions of the patient's history were  reviewed and updated as appropriate: allergies, current medications, past family history, past medical history, past social history, past surgical history and problem list.  Review of Systems A comprehensive review of systems was negative.   Objective:    BP 118/69   Pulse 71   Wt 139 lb (63 kg)   SpO2 99%   BMI 22.78 kg/m  General appearance: alert, cooperative and appears stated age Head: Normocephalic, without obvious abnormality, atraumatic Eyes: conjunctivae/corneas clear. PERRL, EOM's intact. Fundi benign. Ears: normal TM's and external ear canals both ears Nose: Nares normal. Septum midline. Mucosa normal. No drainage or sinus tenderness. Throat: lips, mucosa, and tongue normal; teeth and gums normal Neck: no adenopathy, no carotid bruit, no JVD, supple, symmetrical, trachea midline and thyroid not enlarged, symmetric, no tenderness/mass/nodules Back: symmetric, no curvature. ROM normal. No CVA tenderness. Lungs: clear to auscultation bilaterally Heart: regular rate and rhythm, S1, S2 normal, no murmur, click, rub or gallop Abdomen: soft, non-tender; bowel sounds normal; no masses,  no organomegaly Extremities: extremities normal, atraumatic, no cyanosis or edema Pulses: 2+ and symmetric Skin: Skin color, texture, turgor normal. No  rashes or lesions Lymph nodes: Cervical, supraclavicular, and axillary nodes normal. Neurologic: Alert and oriented X 3, normal strength and tone. Normal symmetric reflexes. Normal coordination and gait    Assessment:    Healthy female exam.      Plan:    Marland KitchenMarland KitchenAmber was seen today for annual exam.  Diagnoses and all orders for this visit:  Routine physical examination -     Lipid Panel w/reflex Direct LDL -     COMPLETE METABOLIC PANEL WITH GFR -     CBC  Screening for diabetes mellitus -     COMPLETE METABOLIC PANEL WITH GFR  Screening for lipid disorders -     Lipid Panel w/reflex Direct LDL   .Marland Kitchen Depression screen [facility] 2/9 12/30/2017  12/30/2017 08/17/2017 04/26/2017 07/31/2015  Decreased Interest 3 0 0 1 0  Down, Depressed, Hopeless 1 0 0 0 0  PHQ - 2 Score 4 0 0 1 0  Altered sleeping 3 1 3  - -  Tired, decreased energy 2 1 3  - -  Change in appetite 3 0 0 - -  Feeling bad or failure about yourself  3 0 0 - -  Trouble concentrating 0 0 1 - -  Moving slowly or fidgety/restless 0 0 0 - -  Suicidal thoughts 1 0 0 - -  PHQ-9 Score 16 2 7  - -  Difficult doing work/chores Somewhat difficult Not difficult at all - - -   Discussed 150 minutes of exercise a week.  Encouraged vitamin D 1000 units and Calcium 1300mg  or 4 servings of dairy a day.  Not due for pap until Jan. 2019.  Discussed need for pap. Pt declines. She is aware of risk.   Pt is doing well on vyvanse. She has lost 4lbs in 2 weeks. Will refill when needs next refill.   See After Visit Summary for Counseling Recommendations

## 2018-01-12 LAB — COMPLETE METABOLIC PANEL WITH GFR
AG RATIO: 1.6 (calc) (ref 1.0–2.5)
ALBUMIN MSPROF: 4.5 g/dL (ref 3.6–5.1)
ALT: 14 U/L (ref 6–29)
AST: 15 U/L (ref 10–30)
Alkaline phosphatase (APISO): 45 U/L (ref 33–115)
BILIRUBIN TOTAL: 0.3 mg/dL (ref 0.2–1.2)
BUN: 15 mg/dL (ref 7–25)
CALCIUM: 9.9 mg/dL (ref 8.6–10.2)
CO2: 22 mmol/L (ref 20–32)
Chloride: 104 mmol/L (ref 98–110)
Creat: 0.86 mg/dL (ref 0.50–1.10)
GFR, EST AFRICAN AMERICAN: 99 mL/min/{1.73_m2} (ref >=60)
GFR, EST NON AFRICAN AMERICAN: 85 mL/min/{1.73_m2} (ref >=60)
Globulin: 2.9 g/dL (calc) (ref 1.9–3.7)
Glucose, Bld: 131 mg/dL — ABNORMAL HIGH (ref 65–99)
POTASSIUM: 4 mmol/L (ref 3.5–5.3)
Sodium: 138 mmol/L (ref 135–146)
TOTAL PROTEIN: 7.4 g/dL (ref 6.1–8.1)

## 2018-01-12 LAB — CBC
HCT: 37 % (ref 35.0–45.0)
HEMOGLOBIN: 12.7 g/dL (ref 11.7–15.5)
MCH: 30.4 pg (ref 27.0–33.0)
MCHC: 34.3 g/dL (ref 32.0–36.0)
MCV: 88.5 fL (ref 80.0–100.0)
MPV: 11.8 fL (ref 7.5–12.5)
Platelets: 358 10*3/uL (ref 140–400)
RBC: 4.18 10*6/uL (ref 3.80–5.10)
RDW: 11.2 % (ref 11.0–15.0)
WBC: 9.4 10*3/uL (ref 3.8–10.8)

## 2018-01-12 LAB — LIPID PANEL W/REFLEX DIRECT LDL
Cholesterol: 246 mg/dL — ABNORMAL HIGH (ref <200)
HDL: 79 mg/dL (ref >50)
LDL Cholesterol (Calc): 141 mg/dL (calc) — ABNORMAL HIGH
NON-HDL CHOLESTEROL (CALC): 167 mg/dL — AB (ref <130)
Total CHOL/HDL Ratio: 3.1 (calc) (ref <5.0)
Triglycerides: 137 mg/dL (ref <150)

## 2018-01-13 ENCOUNTER — Encounter: Payer: Self-pay | Admitting: Physician Assistant

## 2018-01-13 DIAGNOSIS — E7800 Pure hypercholesterolemia, unspecified: Secondary | ICD-10-CM | POA: Insufficient documentation

## 2018-01-27 ENCOUNTER — Other Ambulatory Visit: Payer: Self-pay | Admitting: Physician Assistant

## 2018-01-27 MED ORDER — ACARBOSE 50 MG PO TABS
50.0000 mg | ORAL_TABLET | Freq: Three times a day (TID) | ORAL | 5 refills | Status: DC
Start: 2018-01-27 — End: 2019-01-24

## 2018-01-27 MED FILL — ACARBOSE 50 MG TAB: 50 | 30 days supply | Qty: 90 | Fill #0

## 2018-02-06 ENCOUNTER — Other Ambulatory Visit: Payer: Self-pay

## 2018-02-06 MED ORDER — DROSPIRENONE-ETHINYL ESTRADIOL 3-0.02 MG PO TABS: 1.0000 | ORAL_TABLET | Freq: Every day | ORAL | 4 refills | Status: DC

## 2018-02-06 MED FILL — GIANVI 3-0.02 MG TABS: 3-0.02 | 63 days supply | Qty: 84 | Fill #0

## 2018-02-15 ENCOUNTER — Other Ambulatory Visit: Payer: Self-pay | Admitting: Sports Medicine

## 2018-02-15 DIAGNOSIS — R41840 Attention and concentration deficit: Secondary | ICD-10-CM

## 2018-02-15 DIAGNOSIS — R632 Polyphagia: Secondary | ICD-10-CM

## 2018-02-15 MED ORDER — LISDEXAMFETAMINE DIMESYLATE 30 MG PO CAPS: 30.0000 mg | ORAL_CAPSULE | Freq: Every day | ORAL | 0 refills | Status: DC

## 2018-02-15 MED FILL — VYVANSE 30 MG CAPSULE: 30 | 30 days supply | Qty: 30 | Fill #0

## 2018-02-16 MED FILL — SUMATRIPTAN SUCC 25 MG TAB: 25 | 30 days supply | Qty: 9 | Fill #3

## 2018-03-08 ENCOUNTER — Other Ambulatory Visit: Payer: Self-pay | Admitting: Physician Assistant

## 2018-03-08 DIAGNOSIS — I493 Ventricular premature depolarization: Secondary | ICD-10-CM

## 2018-03-08 MED ORDER — ATENOLOL 25 MG PO TABS: 37.5000 mg | ORAL_TABLET | Freq: Every day | ORAL | 3 refills | Status: DC

## 2018-03-08 MED FILL — ATENOLOL 25 MG TABLET: 25 | 60 days supply | Qty: 90 | Fill #0

## 2018-03-13 MED FILL — ACARBOSE 50 MG TAB: 50 | 30 days supply | Qty: 90 | Fill #1

## 2018-03-15 ENCOUNTER — Ambulatory Visit: Payer: No Typology Code available for payment source | Admitting: Oncology

## 2018-03-17 ENCOUNTER — Ambulatory Visit (INDEPENDENT_AMBULATORY_CARE_PROVIDER_SITE_OTHER): Payer: No Typology Code available for payment source | Admitting: Physician Assistant

## 2018-03-17 VITALS — BP 97/65 | HR 80

## 2018-03-17 DIAGNOSIS — G43011 Migraine without aura, intractable, with status migrainosus: Secondary | ICD-10-CM | POA: Diagnosis not present

## 2018-03-17 MED ORDER — DEXAMETHASONE SODIUM PHOSPHATE 4 MG/ML IJ SOLN
4.0000 mg | Freq: Once | INTRAMUSCULAR | Status: AC
  Administered 2018-03-17: 4 mg via INTRAMUSCULAR

## 2018-03-17 NOTE — Progress Notes (Signed)
Injection of Decadron 46ml given in LUOQ pt tolerated well. Maryruth Eve, Lahoma Crocker

## 2018-04-06 ENCOUNTER — Telehealth: Payer: Self-pay | Admitting: Sports Medicine

## 2018-04-06 MED ORDER — PHENDIMETRAZINE TARTRATE 35 MG PO TABS: 2.0000 | ORAL_TABLET | Freq: Two times a day (BID) | ORAL | 0 refills | Status: DC

## 2018-04-06 MED ORDER — PHENDIMETRAZINE TARTRATE ER 105 MG PO CP24: 1.0000 | ORAL_CAPSULE | Freq: Every day | ORAL | 0 refills | Status: DC

## 2018-04-06 MED FILL — PHENDIMETRAZINE 35 MG TAB: 35 | 30 days supply | Qty: 120 | Fill #0

## 2018-04-06 NOTE — Telephone Encounter (Signed)
Continuous release is too expensive, switching to standard release.

## 2018-04-06 NOTE — Addendum Note (Signed)
Addended by: Silverio Decamp on: 04/06/2018 03:21 PM   Modules accepted: Orders

## 2018-04-06 NOTE — Telephone Encounter (Signed)
Discontinuing Vyvanse due to cost, starting Phendimetrazine, this may help with weight and attention, did get tachyphylaxis with phentermine. Return monthly for weight checks and refills.

## 2018-04-17 MED FILL — GIANVI 3-0.02 MG TABS: 3-0.02 | 63 days supply | Qty: 84 | Fill #1

## 2018-04-17 MED FILL — ACARBOSE 50 MG TAB: 50 | 30 days supply | Qty: 90 | Fill #2

## 2018-05-19 MED FILL — ATENOLOL 25 MG TABLET: 25 | 60 days supply | Qty: 90 | Fill #1

## 2018-05-19 MED FILL — ACARBOSE 50 MG TAB: 50 | 30 days supply | Qty: 90 | Fill #3

## 2018-06-03 ENCOUNTER — Other Ambulatory Visit: Payer: Self-pay | Admitting: Nurse Practitioner

## 2018-06-08 ENCOUNTER — Other Ambulatory Visit: Payer: Self-pay | Admitting: Sports Medicine

## 2018-06-08 MED ORDER — RIZATRIPTAN BENZOATE 10 MG PO TBDP: 10.0000 mg | ORAL_TABLET | ORAL | 3 refills | Status: DC | PRN

## 2018-06-08 MED FILL — traZODone HCL 50 MG TABS: 50 | 30 days supply | Qty: 30 | Fill #1

## 2018-06-08 MED FILL — RIZATRIPTAN 10 MG ODT: 10 | 30 days supply | Qty: 12 | Fill #0

## 2018-07-03 MED FILL — GIANVI 3-0.02 MG TABS: 3-0.02 | 63 days supply | Qty: 84 | Fill #2

## 2018-07-03 MED FILL — ACARBOSE 50 MG TAB: 50 | 30 days supply | Qty: 90 | Fill #4

## 2018-07-31 ENCOUNTER — Telehealth: Payer: Self-pay | Admitting: Sports Medicine

## 2018-07-31 DIAGNOSIS — R635 Abnormal weight gain: Secondary | ICD-10-CM

## 2018-07-31 DIAGNOSIS — F3341 Major depressive disorder, recurrent, in partial remission: Secondary | ICD-10-CM

## 2018-07-31 DIAGNOSIS — G479 Sleep disorder, unspecified: Secondary | ICD-10-CM

## 2018-07-31 MED ORDER — TRAZODONE HCL 100 MG PO TABS: 100.0000 mg | ORAL_TABLET | Freq: Every day | ORAL | 3 refills | Status: DC

## 2018-07-31 MED FILL — traZODone HCL 100 MG TABS: 100 | 30 days supply | Qty: 30 | Fill #0

## 2018-07-31 NOTE — Assessment & Plan Note (Signed)
Increasing trazodone to 100 nightly

## 2018-07-31 NOTE — Assessment & Plan Note (Signed)
Patient is restarted citalopram.

## 2018-07-31 NOTE — Assessment & Plan Note (Signed)
Continues with intermittent use of Phendimetrazine.

## 2018-07-31 NOTE — Telephone Encounter (Signed)
This patient has been intermittently using her trazodone, 50 mg is insufficient to keep her sleep through the night and provide well rested quality sleep.  Increasing to trazodone 100 mg.

## 2018-08-28 MED FILL — ACARBOSE 50 MG TAB: 50 | 30 days supply | Qty: 90 | Fill #5

## 2018-08-28 MED FILL — ATENOLOL 25 MG TABLET: 25 | 60 days supply | Qty: 90 | Fill #2

## 2018-09-18 MED FILL — traZODone HCL 100 MG TABS: 100 | 30 days supply | Qty: 30 | Fill #1

## 2018-09-19 MED FILL — GIANVI 3-0.02 MG TABS: 3-0.02 | 63 days supply | Qty: 84 | Fill #3

## 2018-09-27 ENCOUNTER — Other Ambulatory Visit: Payer: Self-pay | Admitting: Physician Assistant

## 2018-09-27 DIAGNOSIS — F3341 Major depressive disorder, recurrent, in partial remission: Secondary | ICD-10-CM

## 2018-09-27 MED ORDER — CITALOPRAM HYDROBROMIDE 10 MG PO TABS: 10.0000 mg | ORAL_TABLET | Freq: Every day | ORAL | 3 refills | Status: DC

## 2018-09-27 MED FILL — CITALOPRAM HBR 10 MG TABLET: 10 | 90 days supply | Qty: 90 | Fill #0

## 2018-09-28 ENCOUNTER — Other Ambulatory Visit: Payer: Self-pay | Admitting: Sports Medicine

## 2018-10-31 ENCOUNTER — Ambulatory Visit (INDEPENDENT_AMBULATORY_CARE_PROVIDER_SITE_OTHER): Payer: No Typology Code available for payment source | Admitting: Sports Medicine

## 2018-10-31 DIAGNOSIS — D229 Melanocytic nevi, unspecified: Secondary | ICD-10-CM

## 2018-10-31 HISTORY — DX: Melanocytic nevi, unspecified: D22.9

## 2018-10-31 NOTE — Assessment & Plan Note (Addendum)
Punch biopsy of right sided pelvic suspicious nevus. Closed with a 5-0 simple interrupted Ethilon suture. Suture removal in 1 week.  Update: 11/02/2018 Dermatopathology shows a dysplastic nevus, moderate atypia, for this reason we have elected to do a full of the entire with 1 to 2 cm margins. I closed it with absorbable sutures, we will do a wound check in approximately 1 week.

## 2018-10-31 NOTE — Progress Notes (Addendum)
   Initial procedure performed on 10/31/2018  Procedure: Punch biopsy of right sided pelvic suspicious nevus Risks, benefits, and alternatives explained and consent obtained. Time out conducted. Surface prepped with alcohol. 1 cc lidocaine with epinephrine, 1 cc bupivacaine infiltrated in a field block. Adequate anesthesia ensured. Area prepped and draped in a sterile fashion. Excision performed with: 4 mm punch biopsy used to core out the middle of the incision, I then closed the incision with a 5-0 simple interrupted Ethilon suture. Hemostasis achieved. Pt stable. Procedure conducted with female [place], [place].  Update, repeat procedure, this time full excision performed on 11/02/2018  Procedure:  Full excision of 2cm suspicious nevus, left pelvis. Risks, benefits, and alternatives explained and consent obtained. Time out conducted. Surface prepped with alcohol. 5cc lidocaine with epinephine infiltrated in a field block. Adequate anesthesia ensured. Area prepped and draped in a sterile fashion. Excision performed with: Using a #10 blade I made an elliptical incision leaving a 1 to 2 cm margin, I removed the entire lesion subcutaneous tissues, I then used 5-0 simple interrupted Vicryl sutures to close the deep dermis and then a running subcuticular 5-0 simple interrupted suture close the skin followed by Dermabond for reinforcement. Hemostasis achieved. Pt stable.  __________________________________________  Suspicious nevus Punch biopsy of right sided pelvic suspicious nevus. Closed with a 5-0 simple interrupted Ethilon suture. Suture removal in 1 week.  Dermatopathology shows a dysplastic nevus, moderate atypia, for this reason we have elected to do a full of the entire with 1 to 2 cm margins. I closed it with absorbable sutures, we will do a wound check in approximately 1 week.

## 2018-11-07 ENCOUNTER — Ambulatory Visit (INDEPENDENT_AMBULATORY_CARE_PROVIDER_SITE_OTHER): Payer: No Typology Code available for payment source | Admitting: Sports Medicine

## 2018-11-07 ENCOUNTER — Encounter: Payer: Self-pay | Admitting: Sports Medicine

## 2018-11-07 ENCOUNTER — Other Ambulatory Visit: Payer: Self-pay

## 2018-11-07 DIAGNOSIS — G479 Sleep disorder, unspecified: Secondary | ICD-10-CM | POA: Diagnosis not present

## 2018-11-07 DIAGNOSIS — R635 Abnormal weight gain: Secondary | ICD-10-CM | POA: Diagnosis not present

## 2018-11-07 MED ORDER — TRAZODONE HCL 100 MG PO TABS: 100.0000 mg | ORAL_TABLET | Freq: Every day | ORAL | 1 refills | Status: DC

## 2018-11-07 MED ORDER — PHENTERMINE HCL 37.5 MG PO TABS: ORAL_TABLET | ORAL | 0 refills | Status: DC

## 2018-11-07 MED FILL — PHENTERMINE 37.5 MG TABLET: 37.5 | 30 days supply | Qty: 30 | Fill #0

## 2018-11-07 MED FILL — traZODone HCL 100 MG TABS: 100 | 90 days supply | Qty: 90 | Fill #0

## 2018-11-07 NOTE — Assessment & Plan Note (Signed)
Restarting phentermine, 1 month to be given, patient will supplement this with dieting, low carbohydrate, increase fiber in the diet, as well as do 30 minutes of moderate intensity aerobic exercise 3-5 times per week. Follow-up in 1 month.

## 2018-11-07 NOTE — Assessment & Plan Note (Signed)
Refilling trazodone, well controlled with this.

## 2018-11-07 NOTE — Progress Notes (Signed)
Virtual Visit via Telephone   I connected with  Jennifer Abbott  on 11/07/18 by telephone and verified that I am speaking with the correct person using two identifiers.   I discussed the limitations, risks, security and privacy concerns of performing an evaluation and management service by telephone, including the higher likelihood of inaccurate diagnosis and treatment, and the availability of in person appointments.  We also discussed the likely need of an additional face to face encounter for complete and high quality delivery of care.  I also discussed with the patient that there may be a patient responsible charge related to this service. The patient expressed understanding and wishes to proceed.  Subjective:    CC: Help with weight loss  HPI: Pleasant 38 year old female, not really overweight or obese but would like some help losing weight, plans to improve diet, low-carb, as well as increase aerobic exercise.  Insomnia: Would like help with sleep.  Historically well controlled on trazodone needs refill.  I reviewed the past medical history, family history, social history, surgical history, and allergies today and no changes were needed.  Please see the problem list section below in epic for further details.  Past Medical History: Past Medical History:  Diagnosis Date  . Anemia   . Anxiety   . Blood transfusion without reported diagnosis   . Clotting disorder ([place])   . Depression   . GI bleed    Past Surgical History: Past Surgical History:  Procedure Laterality Date  . BREAST ENHANCEMENT SURGERY Bilateral    2003  . ovarirectomy      right for cyst   Social History: Social History   Socioeconomic History  . Marital status: Single    Spouse name: Not on file  . Number of children: Not on file  . Years of education: Not on file  . Highest education level: Not on file  Occupational History  . Not on file  Social Needs  . Financial resource strain: Not on file  .  Food insecurity:    Worry: Not on file    Inability: Not on file  . Transportation needs:    Medical: Not on file    Non-medical: Not on file  Tobacco Use  . Smoking status: Former Research scientist (life sciences)  . Smokeless tobacco: Never Used  Substance and Sexual Activity  . Alcohol use: Yes    Alcohol/week: 0.0 standard drinks    Comment: rare  . Drug use: Not on file  . Sexual activity: Never  Lifestyle  . Physical activity:    Days per week: Not on file    Minutes per session: Not on file  . Stress: Not on file  Relationships  . Social connections:    Talks on phone: Not on file    Gets together: Not on file    Attends religious service: Not on file    Active member of club or organization: Not on file    Attends meetings of clubs or organizations: Not on file    Relationship status: Not on file  Other Topics Concern  . Not on file  Social History Narrative  . Not on file   Family History: Family History  Problem Relation Age of Onset  . Hyperlipidemia Father   . Hypertension Father   . Heart attack Father   . Diabetes Maternal Grandfather   . Heart attack Paternal Grandfather   . Lung cancer Maternal Aunt   . Cancer Maternal Uncle    Allergies: Allergies  Allergen Reactions  . Sulfa Antibiotics     Excessive yeast infection overload.  . Topamax [Topiramate]     Extreme taste changes.    Medications: See med rec.  Review of Systems: No fevers, chills, night sweats, weight loss, chest pain, or shortness of breath.   Objective:    General: Speaking full sentences, no audible heavy breathing.  Sounds alert and appropriately interactive.  No other physical exam performed due to the non-face to face nature of this visit.  Impression and Recommendations:    Abnormal weight gain Restarting phentermine, 1 month to be given, patient will supplement this with dieting, low carbohydrate, increase fiber in the diet, as well as do 30 minutes of moderate intensity aerobic exercise 3-5  times per week. Follow-up in 1 month.  Trouble in sleeping Refilling trazodone, well controlled with this.   I discussed the above assessment and treatment plan with the patient. The patient was provided an opportunity to ask questions and all were answered. The patient agreed with the plan and demonstrated an understanding of the instructions.   The patient was advised to call back or seek an in-person evaluation if the symptoms worsen or if the condition fails to improve as anticipated.   I provided 21 minutes of non-face-to-face time during this encounter, less than 50% of this was time needed to gather information, review chart, records, and complete documentation.   ___________________________________________ Gwen Her. Dianah Field, M.D., ABFM., CAQSM. Primary Care and Sports Medicine [facility]  Adjunct Professor of [employer] [facility] [employer]

## 2018-11-07 NOTE — Addendum Note (Signed)
Addended by: Silverio Decamp on: 11/07/2018 04:17 PM   Modules accepted: Orders

## 2018-11-27 ENCOUNTER — Ambulatory Visit (INDEPENDENT_AMBULATORY_CARE_PROVIDER_SITE_OTHER): Payer: No Typology Code available for payment source | Admitting: Sports Medicine

## 2018-11-27 DIAGNOSIS — D229 Melanocytic nevi, unspecified: Secondary | ICD-10-CM | POA: Diagnosis not present

## 2018-11-27 NOTE — Progress Notes (Signed)
Subjective:    CC: Follow-up  HPI: This is a pleasant 38 year old female, we did an excision of a dysplastic nevus, she is doing very well now.  The excision was 1 month ago.  I reviewed the past medical history, family history, social history, surgical history, and allergies today and no changes were needed.  Please see the problem list section below in epic for further details.  Past Medical History: Past Medical History:  Diagnosis Date  . Anemia   . Anxiety   . Blood transfusion without reported diagnosis   . Clotting disorder ([place])   . Depression   . GI bleed    Past Surgical History: Past Surgical History:  Procedure Laterality Date  . BREAST ENHANCEMENT SURGERY Bilateral    2003  . ovarirectomy      right for cyst   Social History: Social History   Socioeconomic History  . Marital status: Single    Spouse name: Not on file  . Number of children: Not on file  . Years of education: Not on file  . Highest education level: Not on file  Occupational History  . Not on file  Social Needs  . Financial resource strain: Not on file  . Food insecurity:    Worry: Not on file    Inability: Not on file  . Transportation needs:    Medical: Not on file    Non-medical: Not on file  Tobacco Use  . Smoking status: Former Research scientist (life sciences)  . Smokeless tobacco: Never Used  Substance and Sexual Activity  . Alcohol use: Yes    Alcohol/week: 0.0 standard drinks    Comment: rare  . Drug use: Not on file  . Sexual activity: Never  Lifestyle  . Physical activity:    Days per week: Not on file    Minutes per session: Not on file  . Stress: Not on file  Relationships  . Social connections:    Talks on phone: Not on file    Gets together: Not on file    Attends religious service: Not on file    Active member of club or organization: Not on file    Attends meetings of clubs or organizations: Not on file    Relationship status: Not on file  Other Topics Concern  . Not on file   Social History Narrative  . Not on file   Family History: Family History  Problem Relation Age of Onset  . Hyperlipidemia Father   . Hypertension Father   . Heart attack Father   . Diabetes Maternal Grandfather   . Heart attack Paternal Grandfather   . Lung cancer Maternal Aunt   . Cancer Maternal Uncle    Allergies: Allergies  Allergen Reactions  . Sulfa Antibiotics     Excessive yeast infection overload.  . Topamax [Topiramate]     Extreme taste changes.    Medications: See med rec.  Review of Systems: No fevers, chills, night sweats, weight loss, chest pain, or shortness of breath.   Objective:    General: Well Developed, well nourished, and in no acute distress.  Neuro: Alert and oriented x3, extra-ocular muscles intact, sensation grossly intact.  HEENT: Normocephalic, atraumatic, pupils equal round reactive to light, neck supple, no masses, no lymphadenopathy, thyroid nonpalpable.  Skin: Warm and dry, no rashes. Cardiac: Regular rate and rhythm, no murmurs rubs or gallops, no lower extremity edema.  Respiratory: Clear to auscultation bilaterally. Not using accessory muscles, speaking in full sentences. Incision: Clean, dry, intact,  there is a bit of protruding suture at the proximal and distal end of the incision.  Incision itself is almost imperceptible now.  Impression and Recommendations:    Suspicious nevus Punch biopsy showed a dysplastic nevus with moderate atypia, we then proceeded with full excision on 11/02/2018 with 1 to 2 cm margins. Incision is clean, dry, intact, there is a protruding suture at the ends of the incision but otherwise it looks absolutely fantastic. Patient will massage the incision to soften the scar.   ___________________________________________ Gwen Her. Dianah Field, M.D., ABFM., CAQSM. Primary Care and Sports Medicine [facility]  Adjunct Professor of [employer] [facility] [employer]  Medicine

## 2018-11-27 NOTE — Assessment & Plan Note (Signed)
Punch biopsy showed a dysplastic nevus with moderate atypia, we then proceeded with full excision on 11/02/2018 with 1 to 2 cm margins. Incision is clean, dry, intact, there is a protruding suture at the ends of the incision but otherwise it looks absolutely fantastic. Patient will massage the incision to soften the scar.

## 2018-11-29 ENCOUNTER — Ambulatory Visit (INDEPENDENT_AMBULATORY_CARE_PROVIDER_SITE_OTHER): Payer: No Typology Code available for payment source | Admitting: Sports Medicine

## 2018-11-29 ENCOUNTER — Other Ambulatory Visit: Payer: Self-pay

## 2018-11-29 DIAGNOSIS — N946 Dysmenorrhea, unspecified: Secondary | ICD-10-CM | POA: Insufficient documentation

## 2018-11-29 MED ORDER — DROSPIRENONE-ETHINYL ESTRADIOL 3-0.02 MG PO TABS: 1.0000 | ORAL_TABLET | Freq: Every day | ORAL | 2 refills | Status: DC

## 2018-11-29 NOTE — Progress Notes (Signed)
Virtual Visit via WebEx/MyChart   I connected with  Jennifer Abbott  on 11/29/18 via Doximity Video and verified that I am speaking with the correct person using two identifiers.   I discussed the limitations, risks, security and privacy concerns of performing an evaluation and management service by WebEx/MyChart/Doximity Video, including the higher likelihood of inaccurate diagnosis and treatment, and the availability of in person appointments.  We also discussed the likely need of an additional face to face encounter for complete and high quality delivery of care.  I also discussed with the patient that there may be a patient responsible charge related to this service. The patient expressed understanding and wishes to proceed.  Provider location is either at home or medical facility. Patient location is at their home, different from provider location. People involved in care of the patient during this telehealth encounter were myself, my nurse/medical assistant, and my front office/scheduling team member.  Subjective:    CC: Medication refill  HPI: This is a pleasant 38 year old female, she needs refills on her medications, hormonal augmentation for dysmenorrhea.  No questions, no complaints, doing well.  I reviewed the past medical history, family history, social history, surgical history, and allergies today and no changes were needed.  Please see the problem list section below in epic for further details.  Past Medical History: Past Medical History:  Diagnosis Date  . Anemia   . Anxiety   . Blood transfusion without reported diagnosis   . Clotting disorder ([place])   . Depression   . GI bleed    Past Surgical History: Past Surgical History:  Procedure Laterality Date  . BREAST ENHANCEMENT SURGERY Bilateral    2003  . ovarirectomy      right for cyst   Social History: Social History   Socioeconomic History  . Marital status: Single    Spouse name: Not on file  . Number  of children: Not on file  . Years of education: Not on file  . Highest education level: Not on file  Occupational History  . Not on file  Social Needs  . Financial resource strain: Not on file  . Food insecurity:    Worry: Not on file    Inability: Not on file  . Transportation needs:    Medical: Not on file    Non-medical: Not on file  Tobacco Use  . Smoking status: Former Research scientist (life sciences)  . Smokeless tobacco: Never Used  Substance and Sexual Activity  . Alcohol use: Yes    Alcohol/week: 0.0 standard drinks    Comment: rare  . Drug use: Not on file  . Sexual activity: Never  Lifestyle  . Physical activity:    Days per week: Not on file    Minutes per session: Not on file  . Stress: Not on file  Relationships  . Social connections:    Talks on phone: Not on file    Gets together: Not on file    Attends religious service: Not on file    Active member of club or organization: Not on file    Attends meetings of clubs or organizations: Not on file    Relationship status: Not on file  Other Topics Concern  . Not on file  Social History Narrative  . Not on file   Family History: Family History  Problem Relation Age of Onset  . Hyperlipidemia Father   . Hypertension Father   . Heart attack Father   . Diabetes Maternal Grandfather   .  Heart attack Paternal Grandfather   . Lung cancer Maternal Aunt   . Cancer Maternal Uncle    Allergies: Allergies  Allergen Reactions  . Sulfa Antibiotics     Excessive yeast infection overload.  . Topamax [Topiramate]     Extreme taste changes.    Medications: See med rec.  Review of Systems: No fevers, chills, night sweats, weight loss, chest pain, or shortness of breath.   Objective:    General: Speaking full sentences, no audible heavy breathing.  Sounds alert and appropriately interactive.  Appears well.  Face symmetric.  Extraocular movements intact.  Pupils equal and round.  No nasal flaring or accessory muscle use visualized.  No  other physical exam performed due to the non-physical nature of this visit.  Impression and Recommendations:    Dysmenorrhea Filling hormonal contraception for dysmenorrhea. We had a long discussion regarding the mechanism of action of the medications, we discussed things to look out for, warning signs, as well as alternative options to use if needed. No questions, no changes needed.  I discussed the above assessment and treatment plan with the patient. The patient was provided an opportunity to ask questions and all were answered. The patient agreed with the plan and demonstrated an understanding of the instructions.   The patient was advised to call back or seek an in-person evaluation if the symptoms worsen or if the condition fails to improve as anticipated.   I provided 25 minutes of non-face-to-face time during this encounter, 15 minutes of additional time was needed to gather information, review chart, records, communicate/coordinate with staff remotely, troubleshooting the multiple errors that we get every time when trying to do video calls through the electronic medical record, WebEx, and Doximity, restart the encounter multiple times due to instability of the software, as well as complete documentation.   ___________________________________________ Gwen Her. Dianah Field, M.D., ABFM., CAQSM. Primary Care and Sports Medicine [facility]  Adjunct Professor of [employer] [facility] [employer] Medicine

## 2018-11-29 NOTE — Assessment & Plan Note (Addendum)
Filling hormonal contraception for dysmenorrhea. We had a long discussion regarding the mechanism of action of the medications, we discussed things to look out for, warning signs, as well as alternative options to use if needed. No questions, no changes needed.

## 2018-12-01 ENCOUNTER — Other Ambulatory Visit: Payer: Self-pay | Admitting: Sports Medicine

## 2018-12-01 MED ORDER — DROSPIRENONE-ETHINYL ESTRADIOL 3-0.02 MG PO TABS: 1.0000 | ORAL_TABLET | Freq: Every day | ORAL | 2 refills | Status: DC

## 2018-12-01 MED FILL — GIANVI 3-0.02 MG TABS: 3-0.02 | 72 days supply | Qty: 84 | Fill #0

## 2018-12-11 ENCOUNTER — Other Ambulatory Visit: Payer: Self-pay | Admitting: Sports Medicine

## 2018-12-11 DIAGNOSIS — I493 Ventricular premature depolarization: Secondary | ICD-10-CM

## 2018-12-11 MED ORDER — ATENOLOL 25 MG PO TABS: 25.0000 mg | ORAL_TABLET | Freq: Every day | ORAL | 1 refills | Status: DC

## 2018-12-11 MED FILL — ATENOLOL 25 MG TABLET: 25 | 90 days supply | Qty: 90 | Fill #0

## 2018-12-22 ENCOUNTER — Telehealth: Payer: Self-pay | Admitting: Sports Medicine

## 2018-12-22 DIAGNOSIS — W5501XA Bitten by cat, initial encounter: Secondary | ICD-10-CM | POA: Insufficient documentation

## 2018-12-22 MED ORDER — DOXYCYCLINE HYCLATE 100 MG PO TABS: 100.0000 mg | ORAL_TABLET | Freq: Two times a day (BID) | ORAL | 0 refills | Status: AC

## 2018-12-22 MED FILL — DOXYCYCLINE HYCLATE 100 MG: 100 | 7 days supply | Qty: 14 | Fill #0

## 2018-12-22 NOTE — Assessment & Plan Note (Signed)
From a cat bite on the right ulnar forearm, moderate, worsening swelling, pain. We need to cover staph, strep, Pasteurella. Adding doxycycline 100 mg twice a day for 7 days, warm compresses multiple times through the day. We will keep a close eye out for lymphangitis and lymphadenitis. She is currently afebrile and vital signs are stable.

## 2018-12-22 NOTE — Telephone Encounter (Signed)
From a cat bite on the right ulnar forearm, moderate, worsening swelling, pain. We need to cover staph, strep, Pasteurella. Adding doxycycline 100 mg twice a day for 7 days, warm compresses multiple times through the day. We will keep a close eye out for lymphangitis and lymphadenitis. She is currently afebrile and vital signs are stable.

## 2018-12-27 MED FILL — CITALOPRAM HBR 10 MG TABLET: 10 | 90 days supply | Qty: 90 | Fill #1

## 2019-01-24 ENCOUNTER — Ambulatory Visit (INDEPENDENT_AMBULATORY_CARE_PROVIDER_SITE_OTHER): Payer: No Typology Code available for payment source | Admitting: Physician Assistant

## 2019-01-24 ENCOUNTER — Encounter: Payer: Self-pay | Admitting: Physician Assistant

## 2019-01-24 VITALS — BP 101/67 | HR 81 | Ht 65.5 in | Wt 137.0 lb

## 2019-01-24 DIAGNOSIS — Z131 Encounter for screening for diabetes mellitus: Secondary | ICD-10-CM

## 2019-01-24 DIAGNOSIS — Z1231 Encounter for screening mammogram for malignant neoplasm of breast: Secondary | ICD-10-CM

## 2019-01-24 DIAGNOSIS — Z0000 Encounter for general adult medical examination without abnormal findings: Secondary | ICD-10-CM | POA: Diagnosis not present

## 2019-01-24 DIAGNOSIS — Z13220 Encounter for screening for lipoid disorders: Secondary | ICD-10-CM | POA: Diagnosis not present

## 2019-01-24 DIAGNOSIS — Z862 Personal history of diseases of the blood and blood-forming organs and certain disorders involving the immune mechanism: Secondary | ICD-10-CM

## 2019-01-24 NOTE — Patient Instructions (Signed)
Health Maintenance, Female Adopting a healthy lifestyle and getting preventive care are important in promoting health and wellness. Ask your health care provider about:  The right schedule for you to have regular tests and exams.  Things you can do on your own to prevent diseases and keep yourself healthy. What should I know about diet, weight, and exercise? Eat a healthy diet   Eat a diet that includes plenty of vegetables, fruits, low-fat dairy products, and lean protein.  Do not eat a lot of foods that are high in solid fats, added sugars, or sodium. Maintain a healthy weight Body mass index (BMI) is used to identify weight problems. It estimates body fat based on height and weight. Your health care provider can help determine your BMI and help you achieve or maintain a healthy weight. Get regular exercise Get regular exercise. This is one of the most important things you can do for your health. Most adults should:  Exercise for at least 150 minutes each week. The exercise should increase your heart rate and make you sweat (moderate-intensity exercise).  Do strengthening exercises at least twice a week. This is in addition to the moderate-intensity exercise.  Spend less time sitting. Even light physical activity can be beneficial. Watch cholesterol and blood lipids Have your blood tested for lipids and cholesterol at 40 years of age, then have this test every 5 years. Have your cholesterol levels checked more often if:  Your lipid or cholesterol levels are high.  You are older than 40 years of age.  You are at high risk for heart disease. What should I know about cancer screening? Depending on your health history and family history, you may need to have cancer screening at various ages. This may include screening for:  Breast cancer.  Cervical cancer.  Colorectal cancer.  Skin cancer.  Lung cancer. What should I know about heart disease, diabetes, and high blood  pressure? Blood pressure and heart disease  High blood pressure causes heart disease and increases the risk of stroke. This is more likely to develop in people who have high blood pressure readings, are of African descent, or are overweight.  Have your blood pressure checked: ? Every 3-5 years if you are 18-39 years of age. ? Every year if you are 40 years old or older. Diabetes Have regular diabetes screenings. This checks your fasting blood sugar level. Have the screening done:  Once every three years after age 40 if you are at a normal weight and have a low risk for diabetes.  More often and at a younger age if you are overweight or have a high risk for diabetes. What should I know about preventing infection? Hepatitis B If you have a higher risk for hepatitis B, you should be screened for this virus. Talk with your health care provider to find out if you are at risk for hepatitis B infection. Hepatitis C Testing is recommended for:  Everyone born from 1945 through 1965.  Anyone with known risk factors for hepatitis C. Sexually transmitted infections (STIs)  Get screened for STIs, including gonorrhea and chlamydia, if: ? You are sexually active and are younger than 40 years of age. ? You are older than 40 years of age and your health care provider tells you that you are at risk for this type of infection. ? Your sexual activity has changed since you were last screened, and you are at increased risk for chlamydia or gonorrhea. Ask your health care provider if   you are at risk.  Ask your health care provider about whether you are at high risk for HIV. Your health care provider may recommend a prescription medicine to help prevent HIV infection. If you choose to take medicine to prevent HIV, you should first get tested for HIV. You should then be tested every 3 months for as long as you are taking the medicine. Pregnancy  If you are about to stop having your period (premenopausal) and  you may become pregnant, seek counseling before you get pregnant.  Take 400 to 800 micrograms (mcg) of folic acid every day if you become pregnant.  Ask for birth control (contraception) if you want to prevent pregnancy. Osteoporosis and menopause Osteoporosis is a disease in which the bones lose minerals and strength with aging. This can result in bone fractures. If you are 65 years old or older, or if you are at risk for osteoporosis and fractures, ask your health care provider if you should:  Be screened for bone loss.  Take a calcium or vitamin D supplement to lower your risk of fractures.  Be given hormone replacement therapy (HRT) to treat symptoms of menopause. Follow these instructions at home: Lifestyle  Do not use any products that contain nicotine or tobacco, such as cigarettes, e-cigarettes, and chewing tobacco. If you need help quitting, ask your health care provider.  Do not use street drugs.  Do not share needles.  Ask your health care provider for help if you need support or information about quitting drugs. Alcohol use  Do not drink alcohol if: ? Your health care provider tells you not to drink. ? You are pregnant, may be pregnant, or are planning to become pregnant.  If you drink alcohol: ? Limit how much you use to 0-1 drink a day. ? Limit intake if you are breastfeeding.  Be aware of how much alcohol is in your drink. In the [place] one drink equals one 12 oz bottle of beer (355 mL), one 5 oz glass of wine (148 mL), or one 1 oz glass of hard liquor (44 mL). General instructions  Schedule regular health, dental, and eye exams.  Stay current with your vaccines.  Tell your health care provider if: ? You often feel depressed. ? You have ever been abused or do not feel safe at home. Summary  Adopting a healthy lifestyle and getting preventive care are important in promoting health and wellness.  Follow your health care provider's instructions about healthy  diet, exercising, and getting tested or screened for diseases.  Follow your health care provider's instructions on monitoring your cholesterol and blood pressure. This information is not intended to replace advice given to you by your health care provider. Make sure you discuss any questions you have with your health care provider. Document Released: 01/25/2011 Document Revised: 07/05/2018 Document Reviewed: 07/05/2018 Elsevier Patient Education  2020 Elsevier Inc.  

## 2019-01-24 NOTE — Progress Notes (Signed)
Subjective:     Jennifer Abbott is a 38 y.o. female and is here for a comprehensive physical exam. The patient reports no problems.  Social History   Socioeconomic History  . Marital status: Single    Spouse name: Not on file  . Number of children: Not on file  . Years of education: Not on file  . Highest education level: Not on file  Occupational History  . Not on file  Social Needs  . Financial resource strain: Not on file  . Food insecurity    Worry: Not on file    Inability: Not on file  . Transportation needs    Medical: Not on file    Non-medical: Not on file  Tobacco Use  . Smoking status: Former Research scientist (life sciences)  . Smokeless tobacco: Never Used  Substance and Sexual Activity  . Alcohol use: Yes    Alcohol/week: 0.0 standard drinks    Comment: rare  . Drug use: Not on file  . Sexual activity: Never  Lifestyle  . Physical activity    Days per week: Not on file    Minutes per session: Not on file  . Stress: Not on file  Relationships  . Social Herbalist on phone: Not on file    Gets together: Not on file    Attends religious service: Not on file    Active member of club or organization: Not on file    Attends meetings of clubs or organizations: Not on file    Relationship status: Not on file  . Intimate partner violence    Fear of current or ex partner: Not on file    Emotionally abused: Not on file    Physically abused: Not on file    Forced sexual activity: Not on file  Other Topics Concern  . Not on file  Social History Narrative  . Not on file   Health Maintenance  Topic Date Due  . PAP SMEAR-Modifier  01/24/2016  . INFLUENZA VACCINE  02/24/2019  . TETANUS/TDAP  07/26/2021  . HIV Screening  Completed    The following portions of the patient's history were reviewed and updated as appropriate: allergies, current medications, past family history, past medical history, past social history, past surgical history and problem list.  Review of  Systems A comprehensive review of systems was negative.   Objective:    BP 101/67   Pulse 81   Ht 5' 5.5" (1.664 m)   Wt 137 lb (62.1 kg)   BMI 22.45 kg/m  General appearance: alert, cooperative and appears stated age Head: Normocephalic, without obvious abnormality, atraumatic Eyes: conjunctivae/corneas clear. PERRL, EOM's intact. Fundi benign. Ears: normal TM's and external ear canals both ears Nose: Nares normal. Septum midline. Mucosa normal. No drainage or sinus tenderness. Throat: lips, mucosa, and tongue normal; teeth and gums normal Neck: no adenopathy, no carotid bruit, no JVD, supple, symmetrical, trachea midline and thyroid not enlarged, symmetric, no tenderness/mass/nodules Back: symmetric, no curvature. ROM normal. No CVA tenderness. Lungs: clear to auscultation bilaterally Heart: regular rate and rhythm, S1, S2 normal, no murmur, click, rub or gallop Abdomen: soft, non-tender; bowel sounds normal; no masses,  no organomegaly Extremities: extremities normal, atraumatic, no cyanosis or edema Pulses: 2+ and symmetric Skin: Skin color, texture, turgor normal. No rashes or lesions Lymph nodes: Cervical, supraclavicular, and axillary nodes normal. Neurologic: Alert and oriented X 3, normal strength and tone. Normal symmetric reflexes. Normal coordination and gait  Assessment:    Healthy female exam.      Plan:    Marland KitchenMarland KitchenAmber was seen today for annual exam.  Diagnoses and all orders for this visit:  Routine physical examination -     CBC w/Diff/Platelet -     COMPLETE METABOLIC PANEL WITH GFR -     Lipid Panel w/reflex Direct LDL  Screening for diabetes mellitus -     COMPLETE METABOLIC PANEL WITH GFR  Screening for lipid disorders -     Lipid Panel w/reflex Direct LDL  History of ITP -     CBC w/Diff/Platelet  Visit for screening mammogram -     MM DIGITAL SCREENING W/ IMPLANTS BILATERAL   .Marland Kitchen Depression screen [facility] 2/9 01/28/2019  Decreased Interest 0   Down, Depressed, Hopeless 0  PHQ - 2 Score 0  Altered sleeping 0  Tired, decreased energy 0  Change in appetite 0  Feeling bad or failure about yourself  1  Trouble concentrating 0  Moving slowly or fidgety/restless 0  Suicidal thoughts 0  PHQ-9 Score 1  Difficult doing work/chores Not difficult at all  Some encounter information is confidential and restricted. Go to Review Flowsheets activity to see all data.    .. Discussed 150 minutes of exercise a week.  Encouraged vitamin D 1000 units and Calcium 1300mg  or 4 servings of dairy a day.  Mammogram ordered.  Declined pap.  Labs ordered.   No symptoms of ITP. Recheck CBC.   See After Visit Summary for Counseling Recommendations

## 2019-01-28 ENCOUNTER — Encounter: Payer: Self-pay | Admitting: Physician Assistant

## 2019-02-12 MED FILL — GIANVI 3-0.02 MG TABS: 3-0.02 | 72 days supply | Qty: 84 | Fill #1

## 2019-03-19 ENCOUNTER — Other Ambulatory Visit: Payer: Self-pay

## 2019-03-19 DIAGNOSIS — I493 Ventricular premature depolarization: Secondary | ICD-10-CM

## 2019-03-19 MED ORDER — ATENOLOL 25 MG PO TABS: 25.0000 mg | ORAL_TABLET | Freq: Every day | ORAL | 1 refills | Status: DC

## 2019-03-19 MED FILL — ATENOLOL 25 MG TABLET: 25 | 90 days supply | Qty: 90 | Fill #0

## 2019-04-11 MED FILL — CITALOPRAM HBR 10 MG TABLET: 10 | 90 days supply | Qty: 90 | Fill #2

## 2019-04-11 MED FILL — RIZATRIPTAN 10 MG ODT: 10 | 30 days supply | Qty: 12 | Fill #1

## 2019-04-11 MED FILL — GIANVI 3-0.02 MG TABS: 3-0.02 | 72 days supply | Qty: 84 | Fill #2

## 2019-04-18 LAB — LIPID PANEL W/REFLEX DIRECT LDL
Cholesterol: 246 mg/dL — ABNORMAL HIGH (ref <200)
HDL: 90 mg/dL (ref >=50)
LDL Cholesterol (Calc): 130 mg/dL (calc) — ABNORMAL HIGH
Non-HDL Cholesterol (Calc): 156 mg/dL (calc) — ABNORMAL HIGH (ref <130)
Total CHOL/HDL Ratio: 2.7 (calc) (ref <5.0)
Triglycerides: 147 mg/dL (ref <150)

## 2019-04-18 LAB — COMPLETE METABOLIC PANEL WITH GFR
AG Ratio: 1.6 (calc) (ref 1.0–2.5)
ALT: 9 U/L (ref 6–29)
AST: 11 U/L (ref 10–30)
Albumin: 4.4 g/dL (ref 3.6–5.1)
Alkaline phosphatase (APISO): 36 U/L (ref 31–125)
BUN: 14 mg/dL (ref 7–25)
CO2: 24 mmol/L (ref 20–32)
Calcium: 9.1 mg/dL (ref 8.6–10.2)
Chloride: 103 mmol/L (ref 98–110)
Creat: 0.78 mg/dL (ref 0.50–1.10)
GFR, Est African American: 110 mL/min/{1.73_m2} (ref >=60)
GFR, Est Non African American: 95 mL/min/{1.73_m2} (ref >=60)
Globulin: 2.7 g/dL (calc) (ref 1.9–3.7)
Glucose, Bld: 95 mg/dL (ref 65–99)
Potassium: 4.3 mmol/L (ref 3.5–5.3)
Sodium: 136 mmol/L (ref 135–146)
Total Bilirubin: 0.5 mg/dL (ref 0.2–1.2)
Total Protein: 7.1 g/dL (ref 6.1–8.1)

## 2019-04-18 LAB — CBC WITH DIFFERENTIAL/PLATELET
Absolute Monocytes: 654 cells/uL (ref 200–950)
Basophils Absolute: 61 cells/uL (ref 0–200)
Basophils Relative: 0.8 %
Eosinophils Absolute: 190 cells/uL (ref 15–500)
Eosinophils Relative: 2.5 %
HCT: 35.6 % (ref 35.0–45.0)
Hemoglobin: 12 g/dL (ref 11.7–15.5)
Lymphs Abs: 1870 cells/uL (ref 850–3900)
MCH: 30.6 pg (ref 27.0–33.0)
MCHC: 33.7 g/dL (ref 32.0–36.0)
MCV: 90.8 fL (ref 80.0–100.0)
MPV: 11.7 fL (ref 7.5–12.5)
Monocytes Relative: 8.6 %
Neutro Abs: 4826 cells/uL (ref 1500–7800)
Neutrophils Relative %: 63.5 %
Platelets: 307 10*3/uL (ref 140–400)
RBC: 3.92 10*6/uL (ref 3.80–5.10)
RDW: 11.4 % (ref 11.0–15.0)
Total Lymphocyte: 24.6 %
WBC: 7.6 10*3/uL (ref 3.8–10.8)

## 2019-04-18 NOTE — Progress Notes (Signed)
HDL is GREAT. LDL down from 1 year ago. Glucose, kidney, liver look great!

## 2019-04-30 ENCOUNTER — Other Ambulatory Visit: Payer: Self-pay | Admitting: Sports Medicine

## 2019-04-30 DIAGNOSIS — G479 Sleep disorder, unspecified: Secondary | ICD-10-CM

## 2019-04-30 MED ORDER — TRAZODONE HCL 100 MG PO TABS: 100.0000 mg | ORAL_TABLET | Freq: Every day | ORAL | 3 refills | Status: DC

## 2019-04-30 MED FILL — traZODone HCL 100 MG TABS: 100 | 90 days supply | Qty: 90 | Fill #0

## 2019-05-25 ENCOUNTER — Ambulatory Visit (INDEPENDENT_AMBULATORY_CARE_PROVIDER_SITE_OTHER): Payer: No Typology Code available for payment source | Admitting: Sports Medicine

## 2019-05-25 VITALS — BP 90/56 | HR 72 | Temp 97.8°F

## 2019-05-25 DIAGNOSIS — G43011 Migraine without aura, intractable, with status migrainosus: Secondary | ICD-10-CM

## 2019-05-25 DIAGNOSIS — I951 Orthostatic hypotension: Secondary | ICD-10-CM | POA: Diagnosis not present

## 2019-05-25 HISTORY — DX: Orthostatic hypotension: I95.1

## 2019-05-25 MED ORDER — DEXAMETHASONE SODIUM PHOSPHATE 10 MG/ML IJ SOLN: 10.0000 mg | Freq: Once | INTRAMUSCULAR | Status: DC

## 2019-05-25 MED ORDER — DEXAMETHASONE SODIUM PHOSPHATE 10 MG/ML IJ SOLN
10.0000 mg | Freq: Once | INTRAMUSCULAR | Status: AC
  Administered 2019-05-25: 10 mg via INTRAMUSCULAR

## 2019-05-25 MED ORDER — KETOROLAC TROMETHAMINE 30 MG/ML IJ SOLN
30.0000 mg | Freq: Once | INTRAMUSCULAR | Status: AC
  Administered 2019-05-25: 30 mg via INTRAMUSCULAR

## 2019-05-25 NOTE — Progress Notes (Addendum)
Subjective:    CC: Headache  HPI: Jennifer Abbott is here for headache, she is having some nausea, photophobia, phonophobia.  Its been present now for a week.  I reviewed the past medical history, family history, social history, surgical history, and allergies today and no changes were needed.  Please see the problem list section below in epic for further details.  Past Medical History: Past Medical History:  Diagnosis Date  . Anemia   . Anxiety   . Blood transfusion without reported diagnosis   . Clotting disorder ([place])   . Depression   . GI bleed    Past Surgical History: Past Surgical History:  Procedure Laterality Date  . BREAST ENHANCEMENT SURGERY Bilateral    2003  . ovarirectomy      right for cyst   Social History: Social History   Socioeconomic History  . Marital status: Single    Spouse name: Not on file  . Number of children: Not on file  . Years of education: Not on file  . Highest education level: Not on file  Occupational History  . Not on file  Social Needs  . Financial resource strain: Not on file  . Food insecurity    Worry: Not on file    Inability: Not on file  . Transportation needs    Medical: Not on file    Non-medical: Not on file  Tobacco Use  . Smoking status: Former Research scientist (life sciences)  . Smokeless tobacco: Never Used  Substance and Sexual Activity  . Alcohol use: Yes    Alcohol/week: 0.0 standard drinks    Comment: rare  . Drug use: Not on file  . Sexual activity: Never  Lifestyle  . Physical activity    Days per week: Not on file    Minutes per session: Not on file  . Stress: Not on file  Relationships  . Social Herbalist on phone: Not on file    Gets together: Not on file    Attends religious service: Not on file    Active member of club or organization: Not on file    Attends meetings of clubs or organizations: Not on file    Relationship status: Not on file  Other Topics Concern  . Not on file  Social History Narrative  . Not  on file   Family History: Family History  Problem Relation Age of Onset  . Hyperlipidemia Father   . Hypertension Father   . Heart attack Father   . Diabetes Maternal Grandfather   . Heart attack Paternal Grandfather   . Lung cancer Maternal Aunt   . Cancer Maternal Uncle    Allergies: Allergies  Allergen Reactions  . Sulfa Antibiotics     Excessive yeast infection overload.  . Topamax [Topiramate]     Extreme taste changes.    Medications: See med rec.  Review of Systems: No fevers, chills, night sweats, weight loss, chest pain, or shortness of breath.   Objective:    General: Well Developed, well nourished, and in no acute distress.  Neuro: Alert and oriented x3, extra-ocular muscles intact, sensation grossly intact.  HEENT: Normocephalic, atraumatic, pupils equal round reactive to light, neck supple, no masses, no lymphadenopathy, thyroid nonpalpable.  Skin: Warm and dry, no rashes. Cardiac: Regular rate and rhythm, no murmurs rubs or gallops, no lower extremity edema.  Respiratory: Clear to auscultation bilaterally. Not using accessory muscles, speaking in full sentences.  Toradol 30, Decadron 10, Zofran all given in the office.  I infused 1.6 L of normal saline into the right cephalic vein through a [id number] angiocatheter.  This was placed by physician.  Impression and Recommendations:    Orthostatic hypotension Headache, blood pressure drops to upper Q000111Q systolic with standing, normal for patient 0000000 systolic with sitting. We gave her some medication for her headache, Toradol, Decadron, Zofran for nausea, there likely is an element of status migrainosus. I also infused 1.6 L of normal saline to the right cephalic vein. Orthostatic vital signs improved post infusion.   ___________________________________________ Gwen Her. Dianah Field, M.D., ABFM., CAQSM. Primary Care and Sports Medicine [place] [facility]  Adjunct Professor of [employer] [facility] [employer]

## 2019-05-25 NOTE — Assessment & Plan Note (Addendum)
Headache, blood pressure drops to upper Q000111Q systolic with standing, normal for patient 0000000 systolic with sitting. We gave her some medication for her headache, Toradol, Decadron, Zofran for nausea, there likely is an element of status migrainosus. I also infused 1.6 L of normal saline to the right cephalic vein. Orthostatic vital signs improved post infusion.

## 2019-05-25 NOTE — Progress Notes (Signed)
Patient presents to office for injections for headache.  Patient tolerated injections well without immediate complications

## 2019-06-15 ENCOUNTER — Other Ambulatory Visit: Payer: Self-pay

## 2019-06-15 ENCOUNTER — Ambulatory Visit: Payer: No Typology Code available for payment source

## 2019-06-15 DIAGNOSIS — Z209 Contact with and (suspected) exposure to unspecified communicable disease: Secondary | ICD-10-CM

## 2019-06-17 LAB — NOVEL CORONAVIRUS, NAA: SARS-CoV-2, NAA: NOT DETECTED

## 2019-06-18 NOTE — Progress Notes (Signed)
Not detected.

## 2019-06-25 ENCOUNTER — Telehealth: Payer: Self-pay | Admitting: Sports Medicine

## 2019-06-25 DIAGNOSIS — Z20828 Contact with and (suspected) exposure to other viral communicable diseases: Secondary | ICD-10-CM

## 2019-06-25 DIAGNOSIS — Z8616 Personal history of COVID-19: Secondary | ICD-10-CM

## 2019-06-25 DIAGNOSIS — Z8619 Personal history of other infectious and parasitic diseases: Secondary | ICD-10-CM

## 2019-06-25 DIAGNOSIS — Z20822 Contact with and (suspected) exposure to covid-19: Secondary | ICD-10-CM

## 2019-06-25 NOTE — Telephone Encounter (Signed)
Possible distant exposure, curious about antibodies

## 2019-06-26 DIAGNOSIS — Z8616 Personal history of COVID-19: Secondary | ICD-10-CM | POA: Insufficient documentation

## 2019-06-26 DIAGNOSIS — Z8619 Personal history of other infectious and parasitic diseases: Secondary | ICD-10-CM | POA: Insufficient documentation

## 2019-06-26 LAB — SAR COV2 SEROLOGY (COVID19)AB(IGG),IA: SARS CoV2 AB IGG: POSITIVE — AB

## 2019-06-26 NOTE — Assessment & Plan Note (Signed)
Asymptomatic, positive antibodies, likely immune.

## 2019-06-28 ENCOUNTER — Other Ambulatory Visit: Payer: Self-pay | Admitting: Sports Medicine

## 2019-06-28 MED ORDER — DROSPIRENONE-ETHINYL ESTRADIOL 3-0.02 MG PO TABS: 1.0000 | ORAL_TABLET | Freq: Every day | ORAL | 2 refills | Status: DC

## 2019-06-28 MED FILL — ATENOLOL 25 MG TABS: 25 | 90 days supply | Qty: 90 | Fill #1

## 2019-06-28 MED FILL — GIANVI 3-0.02 MG TABS: 3-0.02 | 72 days supply | Qty: 84 | Fill #0

## 2019-08-10 ENCOUNTER — Encounter: Payer: Self-pay | Admitting: Osteopathic Medicine

## 2019-08-10 ENCOUNTER — Ambulatory Visit (INDEPENDENT_AMBULATORY_CARE_PROVIDER_SITE_OTHER): Payer: No Typology Code available for payment source | Admitting: Osteopathic Medicine

## 2019-08-10 DIAGNOSIS — M9901 Segmental and somatic dysfunction of cervical region: Secondary | ICD-10-CM | POA: Diagnosis not present

## 2019-08-10 DIAGNOSIS — M9902 Segmental and somatic dysfunction of thoracic region: Secondary | ICD-10-CM

## 2019-08-16 ENCOUNTER — Telehealth: Payer: Self-pay | Admitting: Sports Medicine

## 2019-08-16 MED ORDER — PHENTERMINE HCL 37.5 MG PO TABS: ORAL_TABLET | ORAL | 0 refills | Status: DC

## 2019-08-16 NOTE — Telephone Encounter (Signed)
This patient is trying to lose weight as a [place] resolution.  She would like a month or so oh pharmacologic help.

## 2019-08-17 NOTE — Progress Notes (Signed)
Jennifer Abbott is a 39 y.o. female who presents to  [facility]  today seeking care for the following:  The primary encounter diagnosis was Somatic dysfunction of spine, cervical. A diagnosis of Somatic dysfunction of spine, thoracic was also pertinent to this visit.   ASSESSMENT & PLAN with other pertinent history/findings:  Neck and upper back pain w/ TART changes contirbuting to migraine headaches. Applied MFR anf FPR to cervical and thoracis spine to (+) patient relief. Applied ME to cervical spine to (+) patient relief.       Follow-up instructions: Return if symptoms worsen or fail to improve.      There were no vitals taken for this visit.  Current Meds  Medication Sig  . atenolol (TENORMIN) 25 MG tablet Take 1 tablet (25 mg total) by mouth daily.  . drospirenone-ethinyl estradiol (YAZ) 3-0.02 MG tablet Take 1 tablet by mouth daily. Please fill [place] only. Skip placebos.  . rizatriptan (MAXALT-MLT) 10 MG disintegrating tablet Take 1 tablet (10 mg total) by mouth as needed for migraine. May repeat in 2 hours if needed  . traZODone (DESYREL) 100 MG tablet Take 1 tablet (100 mg total) by mouth at bedtime.    No results found for this or any previous visit (from the past 72 hour(s)).  No results found.  Depression screen [facility] 2/9 01/28/2019  Decreased Interest 0  Down, Depressed, Hopeless 0  PHQ - 2 Score 0  Altered sleeping 0  Tired, decreased energy 0  Change in appetite 0  Feeling bad or failure about yourself  1  Trouble concentrating 0  Moving slowly or fidgety/restless 0  Suicidal thoughts 0  PHQ-9 Score 1  Difficult doing work/chores Not difficult at all  Some encounter information is confidential and restricted. Go to Review Flowsheets activity to see all data.    No flowsheet data found.    All questions at time of visit were answered - patient instructed to contact office with any additional  concerns or updates.  ER/RTC precautions were reviewed with the patient.  Please note: voice recognition software was used to produce this document, and typos may escape review. Please contact Dr. Sheppard Coil for any needed clarifications.

## 2019-08-20 ENCOUNTER — Ambulatory Visit (INDEPENDENT_AMBULATORY_CARE_PROVIDER_SITE_OTHER): Payer: No Typology Code available for payment source | Admitting: Sports Medicine

## 2019-08-20 DIAGNOSIS — I493 Ventricular premature depolarization: Secondary | ICD-10-CM

## 2019-08-20 NOTE — Progress Notes (Addendum)
    Procedures performed today:    Twelve-lead ECG personally reviewed, she is having ventricular bigeminy.  Independent interpretation of tests performed by another provider:   None.  Impression and Recommendations:    PVC (premature ventricular contraction) Jennifer Abbott has been having significant palpitations, palpable pulse is in the 30s to 40s, we obtained an ECG and were able to catch her rhythm, she was having ventricular bigeminy. She has been taking atenolol for palpitations over the weekend. Her blood pressure is stable, she is not presyncopal, auscultation is relatively normal, I think the ultimate treatment here is to simply avoid precipitants of PVCs including excessive caffeine, alcohol. I would also recommend changing her from atenolol to metoprolol which would be more selective. She will think about this and we can discuss it in the future.    ___________________________________________ Gwen Her. Dianah Field, M.D., ABFM., CAQSM. Primary Care and [place] Instructor of [place] of [facility] of Medicine

## 2019-08-20 NOTE — Assessment & Plan Note (Addendum)
Jennifer Abbott has been having significant palpitations, palpable pulse is in the 30s to 40s, we obtained an ECG and were able to catch her rhythm, she was having ventricular bigeminy. She has been taking atenolol for palpitations over the weekend. Her blood pressure is stable, she is not presyncopal, auscultation is relatively normal, I think the ultimate treatment here is to simply avoid precipitants of PVCs including excessive caffeine, alcohol. I would also recommend changing her from atenolol to metoprolol which would be more selective. She will think about this and we can discuss it in the future.

## 2019-08-27 ENCOUNTER — Telehealth: Payer: Self-pay | Admitting: Sports Medicine

## 2019-08-27 DIAGNOSIS — I493 Ventricular premature depolarization: Secondary | ICD-10-CM

## 2019-08-27 MED ORDER — METOPROLOL SUCCINATE ER 25 MG PO TB24: 25.0000 mg | ORAL_TABLET | Freq: Every day | ORAL | 11 refills | Status: DC

## 2019-08-27 MED FILL — METOPROLOL SUCCINATE ER 25: 25 | 30 days supply | Qty: 30 | Fill #0

## 2019-08-27 NOTE — Assessment & Plan Note (Signed)
Jennifer Abbott was continuing to have palpitations, palpable pulses in the 30s to 40s, we had obtained an ECG last week, and she was in ventricular bigeminy. She has discontinued atenolol, today she took a metoprolol 25 mg and symptoms improved considerably.

## 2019-08-27 NOTE — Telephone Encounter (Signed)
[id number]

## 2019-08-28 MED FILL — PHENTERMINE 37.5 MG TABLET: 37.5 | 30 days supply | Qty: 30 | Fill #0

## 2019-09-17 ENCOUNTER — Telehealth: Payer: Self-pay

## 2019-09-17 DIAGNOSIS — I498 Other specified cardiac arrhythmias: Secondary | ICD-10-CM

## 2019-09-17 DIAGNOSIS — I493 Ventricular premature depolarization: Secondary | ICD-10-CM

## 2019-09-17 MED ORDER — METOPROLOL SUCCINATE ER 50 MG PO TB24: 50.0000 mg | ORAL_TABLET | Freq: Every day | ORAL | 3 refills | Status: DC

## 2019-09-17 MED FILL — METOPROLOL SUCCINATE ER 50: 50 | 30 days supply | Qty: 30 | Fill #0

## 2019-09-17 NOTE — Assessment & Plan Note (Addendum)
Ventricular bigeminy, initially improved on metoprolol 25 mg in early February. We recently increased to 50 mg, not much better just yet. Refilling medication, 50 mg daily this time, certainly Bystolic would continue to be a future option should she continue to have significant sluggishness from her metoprolol. Certainly at some point we need to ensure that the heart is structurally normal with an echocardiogram.

## 2019-09-17 NOTE — Telephone Encounter (Signed)
Jennifer Abbott states she needs a refill on Metoprolol. She is now taking Metoprolol 50 mg.

## 2019-09-18 MED FILL — CITALOPRAM HBR 10 MG TABLET: 10 | 90 days supply | Qty: 90 | Fill #3

## 2019-09-18 MED FILL — GIANVI 3-0.02 MG TABS: 3-0.02 | 72 days supply | Qty: 84 | Fill #1

## 2019-10-04 NOTE — Addendum Note (Signed)
Addended by: Clemetine Marker A on: 10/04/2019 02:52 PM   Modules accepted: Orders

## 2019-10-17 MED FILL — METOPROLOL SUCCINATE ER 50: 50 | 30 days supply | Qty: 30 | Fill #1

## 2019-10-17 MED FILL — traZODone HCL 100 MG TABS: 100 | 90 days supply | Qty: 90 | Fill #1

## 2019-11-06 ENCOUNTER — Ambulatory Visit (INDEPENDENT_AMBULATORY_CARE_PROVIDER_SITE_OTHER): Payer: No Typology Code available for payment source | Admitting: Sports Medicine

## 2019-11-06 DIAGNOSIS — R635 Abnormal weight gain: Secondary | ICD-10-CM

## 2019-11-06 NOTE — Progress Notes (Addendum)
    Procedures performed today:    None.  Independent interpretation of notes and tests performed by another provider:   None.  Brief History, Exam, Impression, and Recommendations:    Abnormal weight gain We had initially started phentermine, we are now going to discontinue, the patient does not want me to know her weight, and no weight will thus be listed in the chart, she understands that this is an odd request and that we will not be continuing phentermine for this reason. This visit will be no charged.    ___________________________________________ Gwen Her. Dianah Field, M.D., ABFM., CAQSM. Primary Care and [place] Instructor of [place] of [facility] of Medicine

## 2019-11-06 NOTE — Assessment & Plan Note (Addendum)
We had initially started phentermine, we are now going to discontinue, the patient does not want me to know her weight, and no weight will thus be listed in the chart, she understands that this is an odd request and that we will not be continuing phentermine for this reason. This visit will be no charged.

## 2019-11-06 NOTE — Addendum Note (Signed)
Addended by: Silverio Decamp on: 11/06/2019 11:01 AM   Modules accepted: Orders

## 2019-11-16 MED FILL — METOPROLOL SUCCINATE ER 50: 50 | 30 days supply | Qty: 30 | Fill #2

## 2019-11-20 ENCOUNTER — Telehealth: Payer: Self-pay | Admitting: *Deleted

## 2019-11-20 DIAGNOSIS — I498 Other specified cardiac arrhythmias: Secondary | ICD-10-CM

## 2019-11-20 NOTE — Telephone Encounter (Signed)
Pt wanted to know if a referral for cardiology should be placed for her? She hasn't ever been seen by cardiology. She has only worn a 48 hr monitor and had EKG's done.   Bp: 101/67 p: 79

## 2019-11-21 ENCOUNTER — Encounter: Payer: Self-pay | Admitting: Cardiology

## 2019-11-21 ENCOUNTER — Ambulatory Visit (INDEPENDENT_AMBULATORY_CARE_PROVIDER_SITE_OTHER): Payer: No Typology Code available for payment source | Admitting: Cardiology

## 2019-11-21 ENCOUNTER — Other Ambulatory Visit: Payer: Self-pay

## 2019-11-21 VITALS — BP 119/75 | HR 84 | Ht 65.5 in | Wt 144.8 lb

## 2019-11-21 DIAGNOSIS — R002 Palpitations: Secondary | ICD-10-CM

## 2019-11-21 MED ORDER — METOPROLOL SUCCINATE ER 25 MG PO TB24: 25.0000 mg | ORAL_TABLET | Freq: Every day | ORAL | 3 refills | Status: DC

## 2019-11-21 NOTE — Progress Notes (Signed)
Referring-Jade Breeback PA-C Reason for referral-palpitations  HPI: 39 year old female for evaluation of palpitations at request of [place] [facility] PAC.  Holter monitor August 2016 showed sinus rhythm with PACs and PVCs.  Patient states that she feels palpitations and describes them as a skip with a rush to her head.  She otherwise denies sustained palpitations, chest pain, dyspnea or recent syncope.  Current Outpatient Medications  Medication Sig Dispense Refill  . citalopram (CELEXA) 10 MG tablet Take 1 tablet (10 mg total) by mouth daily. 90 tablet 3  . drospirenone-ethinyl estradiol (YAZ) 3-0.02 MG tablet Take 1 tablet by mouth daily. Please fill [place] only. Skip placebos. 3 Package 2  . metoprolol succinate (TOPROL XL) 50 MG 24 hr tablet Take 1 tablet (50 mg total) by mouth daily. Take with or immediately following a meal. 30 tablet 3  . rizatriptan (MAXALT-MLT) 10 MG disintegrating tablet Take 1 tablet (10 mg total) by mouth as needed for migraine. May repeat in 2 hours if needed 10 tablet 3  . traZODone (DESYREL) 100 MG tablet Take 1 tablet (100 mg total) by mouth at bedtime. 90 tablet 3   No current facility-administered medications for this visit.    Allergies  Allergen Reactions  . Topamax [Topiramate]     Extreme taste changes.      Past Medical History:  Diagnosis Date  . Anemia   . Anxiety   . Blood transfusion without reported diagnosis   . Clotting disorder ([place])   . Colitis   . Depression   . GI bleed   . History of ITP     Past Surgical History:  Procedure Laterality Date  . BREAST ENHANCEMENT SURGERY Bilateral    2003  . COSMETIC SURGERY    . ovarirectomy      right for cyst    Social History   Socioeconomic History  . Marital status: Single    Spouse name: Not on file  . Number of children: 2  . Years of education: Not on file  . Highest education level: Not on file  Occupational History  . Not on file  Tobacco Use  . Smoking status:  Former Research scientist (life sciences)  . Smokeless tobacco: Never Used  Substance and Sexual Activity  . Alcohol use: Yes    Alcohol/week: 0.0 standard drinks    Comment: 12 beers per week  . Drug use: Not on file  . Sexual activity: Never  Other Topics Concern  . Not on file  Social History Narrative  . Not on file   Social Determinants of Health   Financial Resource Strain:   . Difficulty of Paying Living Expenses:   Food Insecurity:   . Worried About Charity fundraiser in the Last Year:   . Arboriculturist in the Last Year:   Transportation Needs:   . Film/video editor (Medical):   Marland Kitchen Lack of Transportation (Non-Medical):   Physical Activity:   . Days of Exercise per Week:   . Minutes of Exercise per Session:   Stress:   . Feeling of Stress :   Social Connections:   . Frequency of Communication with Friends and Family:   . Frequency of Social Gatherings with Friends and Family:   . Attends Religious Services:   . Active Member of Clubs or Organizations:   . Attends Archivist Meetings:   Marland Kitchen Marital Status:   Intimate Partner Violence:   . Fear of Current or Ex-Partner:   . Emotionally  Abused:   Marland Kitchen Physically Abused:   . Sexually Abused:     Family History  Problem Relation Age of Onset  . Hyperlipidemia Father   . Hypertension Father   . Heart attack Father   . Diabetes Maternal Grandfather   . Heart attack Paternal Grandfather   . Lung cancer Maternal Aunt   . Cancer Maternal Uncle     ROS: no fevers or chills, productive cough, hemoptysis, dysphasia, odynophagia, melena, hematochezia, dysuria, hematuria, rash, seizure activity, orthopnea, PND, pedal edema, claudication. Remaining systems are negative.  Physical Exam:   Blood pressure 119/75, pulse 84, height 5' 5.5" (1.664 m), weight 144 lb 12.8 oz (65.7 kg).  General:  Well developed/well nourished in NAD Skin warm/dry Patient not depressed No peripheral clubbing Back-normal HEENT-normal/normal eyelids Neck  supple/normal carotid upstroke bilaterally; no bruits; no JVD; no thyromegaly chest - CTA/ normal expansion CV - RRR/normal S1 and S2; no murmurs, rubs or gallops;  PMI nondisplaced Abdomen -NT/ND, no HSM, no mass, + bowel sounds, no bruit 2+ femoral pulses, no bruits Ext-no edema, chords, 2+ DP Neuro-grossly nonfocal  ECG -August 20, 2019-sinus rhythm with ventricular bigeminy.  Personally reviewed  Today's electrocardiogram shows sinus rhythm with occasional PVC, nonspecific ST changes.  There was low voltage.  A/P  1 palpitations-patient had symptoms with her recent electrocardiogram which showed PVCs.  We will increase Toprol to 75 mg daily.  Check potassium, magnesium and TSH.  We will also arrange echocardiogram to assess LV function.  We can increase Toprol as needed and as tolerated by blood pressure.  We discussed decreasing alcohol and caffeine use.  Kirk Ruths, MD

## 2019-11-21 NOTE — Patient Instructions (Signed)
Medication Instructions:  INCREASE METOPROLOL TO 75 MG ONCE DAILY AT BEDTIME= 1 OF THE 50 MG TABLETS AND 1 OF THE 25 MG TABLETS TOGETHER ONCE DAILY  *If you need a refill on your cardiac medications before your next appointment, please call your pharmacy*   Lab Work: Your physician recommends that you HAVE LAB WORK TODAY  If you have labs (blood work) drawn today and your tests are completely normal, you will receive your results only by: Marland Kitchen MyChart Message (if you have MyChart) OR . A paper copy in the mail If you have any lab test that is abnormal or we need to change your treatment, we will call you to review the results.   Testing/Procedures: Your physician has requested that you have an echocardiogram. Echocardiography is a painless test that uses sound waves to create images of your heart. It provides your doctor with information about the size and shape of your heart and how well your heart's chambers and valves are working. This procedure takes approximately one hour. There are no restrictions for this procedure.[place] [facility]-1ST FLOOR IMAGING     Follow-Up: At [facility], you and your health needs are our priority.  As part of our continuing mission to provide you with exceptional heart care, we have created designated Provider Care Teams.  These Care Teams include your primary Cardiologist (physician) and Advanced Practice Providers (APPs -  Physician Assistants and Nurse Practitioners) who all work together to provide you with the care you need, when you need it.  We recommend signing up for the patient portal called "MyChart".  Sign up information is provided on this After Visit Summary.  MyChart is used to connect with patients for Virtual Visits (Telemedicine).  Patients are able to view lab/test results, encounter notes, upcoming appointments, etc.  Non-urgent messages can be sent to your provider as well.   To learn more about what you can do with MyChart, go to  [email or web address].    Your next appointment:   3 month(s)  The format for your next appointment:   In Person  Provider:   Kirk Ruths, MD

## 2019-11-22 ENCOUNTER — Encounter: Payer: Self-pay | Admitting: *Deleted

## 2019-11-22 LAB — BASIC METABOLIC PANEL
BUN: 20 mg/dL (ref 7–25)
CO2: 22 mmol/L (ref 20–32)
Calcium: 9.6 mg/dL (ref 8.6–10.2)
Chloride: 103 mmol/L (ref 98–110)
Creat: 0.86 mg/dL (ref 0.50–1.10)
Glucose, Bld: 85 mg/dL (ref 65–99)
Potassium: 4.3 mmol/L (ref 3.5–5.3)
Sodium: 136 mmol/L (ref 135–146)

## 2019-11-22 LAB — MAGNESIUM: Magnesium: 2 mg/dL (ref 1.5–2.5)

## 2019-11-22 LAB — TSH: TSH: 1.48 mIU/L

## 2019-11-29 ENCOUNTER — Ambulatory Visit (HOSPITAL_BASED_OUTPATIENT_CLINIC_OR_DEPARTMENT_OTHER): Payer: No Typology Code available for payment source

## 2019-12-06 ENCOUNTER — Other Ambulatory Visit: Payer: Self-pay

## 2019-12-06 ENCOUNTER — Ambulatory Visit (HOSPITAL_BASED_OUTPATIENT_CLINIC_OR_DEPARTMENT_OTHER)
Admission: RE | Admit: 2019-12-06 | Discharge: 2019-12-06 | Disposition: A | Discharge status: Home / Self Care | Payer: No Typology Code available for payment source | Source: Ambulatory Visit | Attending: Cardiology | Admitting: Cardiology

## 2019-12-06 DIAGNOSIS — R002 Palpitations: Secondary | ICD-10-CM | POA: Diagnosis present

## 2019-12-06 NOTE — Progress Notes (Signed)
  Echocardiogram 2D Echocardiogram has been performed.  Jennifer Abbott 12/06/2019, 2:04 PM

## 2019-12-13 MED FILL — METOPROLOL SUCCINATE ER 50: 50 | 30 days supply | Qty: 30 | Fill #3

## 2019-12-25 ENCOUNTER — Encounter: Payer: Self-pay | Admitting: Sports Medicine

## 2019-12-25 ENCOUNTER — Ambulatory Visit (INDEPENDENT_AMBULATORY_CARE_PROVIDER_SITE_OTHER): Payer: No Typology Code available for payment source | Admitting: Sports Medicine

## 2019-12-25 DIAGNOSIS — R635 Abnormal weight gain: Secondary | ICD-10-CM | POA: Diagnosis not present

## 2019-12-25 MED ORDER — PHENTERMINE HCL 37.5 MG PO TABS: ORAL_TABLET | ORAL | 0 refills | Status: DC

## 2019-12-25 MED FILL — PHENTERMINE 37.5 MG TABLET: 37.5 | 30 days supply | Qty: 30 | Fill #0

## 2019-12-25 NOTE — Assessment & Plan Note (Signed)
Restarting phentermine, [name] understands let me know she develops any palpitations, excessive dry mouth, constipation. Return in 1 month for a weight check and refills.

## 2019-12-25 NOTE — Progress Notes (Signed)
    Procedures performed today:    None.  Independent interpretation of notes and tests performed by another provider:   None.  Brief History, Exam, Impression, and Recommendations:    Abnormal weight gain Restarting phentermine, Sarah understands let me know she develops any palpitations, excessive dry mouth, constipation. Return in 1 month for a weight check and refills.    ___________________________________________ Gwen Her. Dianah Field, M.D., ABFM., CAQSM. Primary Care and [place] Instructor of [employer] [facility] of [facility]

## 2019-12-30 ENCOUNTER — Other Ambulatory Visit: Payer: Self-pay | Admitting: Nurse Practitioner

## 2019-12-30 MED ORDER — CYCLOBENZAPRINE HCL 10 MG PO TABS: 10.0000 mg | ORAL_TABLET | Freq: Three times a day (TID) | ORAL | 2 refills | Status: DC | PRN

## 2020-01-08 ENCOUNTER — Other Ambulatory Visit: Payer: Self-pay | Admitting: Physician Assistant

## 2020-01-08 DIAGNOSIS — F3341 Major depressive disorder, recurrent, in partial remission: Secondary | ICD-10-CM

## 2020-01-08 MED ORDER — CITALOPRAM HYDROBROMIDE 10 MG PO TABS: 10.0000 mg | ORAL_TABLET | Freq: Every day | ORAL | 3 refills | Status: DC

## 2020-01-08 MED FILL — CITALOPRAM HBR 10 MG TABLET: 10 | 90 days supply | Qty: 90 | Fill #0

## 2020-01-08 NOTE — Progress Notes (Signed)
Refilled celexa.BMP UTd.

## 2020-01-16 ENCOUNTER — Other Ambulatory Visit: Payer: Self-pay | Admitting: Cardiology

## 2020-01-16 ENCOUNTER — Telehealth: Payer: Self-pay | Admitting: Cardiology

## 2020-01-16 DIAGNOSIS — I498 Other specified cardiac arrhythmias: Secondary | ICD-10-CM

## 2020-01-16 DIAGNOSIS — I493 Ventricular premature depolarization: Secondary | ICD-10-CM

## 2020-01-16 DIAGNOSIS — R002 Palpitations: Secondary | ICD-10-CM

## 2020-01-16 MED ORDER — METOPROLOL SUCCINATE ER 50 MG PO TB24: 50.0000 mg | ORAL_TABLET | Freq: Every day | ORAL | 3 refills | Status: DC

## 2020-01-16 MED ORDER — METOPROLOL SUCCINATE ER 25 MG PO TB24: 25.0000 mg | ORAL_TABLET | Freq: Every day | ORAL | 3 refills | Status: DC

## 2020-01-16 MED FILL — METOPROLOL SUCCINATE ER 25: 25 | 90 days supply | Qty: 90 | Fill #0

## 2020-01-16 MED FILL — METOPROLOL SUCCINATE ER 50: 50 | 90 days supply | Qty: 90 | Fill #0

## 2020-01-16 NOTE — Telephone Encounter (Signed)
Will send to Dr. Jacalyn Lefevre nurse Hilda Blades as this pt was seen in the [place] office.

## 2020-01-16 NOTE — Telephone Encounter (Signed)
Spoke with pt, aware refills sent to the pharmacy.metoprolol does not come in a 75 mg tablet.

## 2020-01-16 NOTE — Telephone Encounter (Signed)
      Pt c/o medication issue:  1. Name of Medication:   metoprolol succinate (TOPROL XL) 50 MG 24 hr tablet    2. How are you currently taking this medication (dosage and times per day)?   3. Are you having a reaction (difficulty breathing--STAT)?   4. What is your medication issue? Pt had ran out of her 50 mg and 25 mg of metoprolol so she needs a refill now for 75 mg capsul, need to send new prescription to [place], [place]

## 2020-01-22 ENCOUNTER — Other Ambulatory Visit: Payer: Self-pay | Admitting: Sports Medicine

## 2020-01-22 MED ORDER — RIZATRIPTAN BENZOATE 10 MG PO TBDP: 10.0000 mg | ORAL_TABLET | ORAL | 11 refills | Status: DC | PRN

## 2020-01-22 MED FILL — RIZATRIPTAN 10 MG ODT: 10 | 17 days supply | Qty: 10 | Fill #0

## 2020-01-25 ENCOUNTER — Other Ambulatory Visit: Payer: Self-pay

## 2020-01-25 ENCOUNTER — Ambulatory Visit (INDEPENDENT_AMBULATORY_CARE_PROVIDER_SITE_OTHER): Payer: No Typology Code available for payment source | Admitting: Physician Assistant

## 2020-01-25 ENCOUNTER — Encounter: Payer: Self-pay | Admitting: Physician Assistant

## 2020-01-25 VITALS — BP 97/68 | HR 93 | Ht 65.5 in | Wt 144.0 lb

## 2020-01-25 DIAGNOSIS — Z862 Personal history of diseases of the blood and blood-forming organs and certain disorders involving the immune mechanism: Secondary | ICD-10-CM | POA: Diagnosis not present

## 2020-01-25 DIAGNOSIS — Z0000 Encounter for general adult medical examination without abnormal findings: Secondary | ICD-10-CM

## 2020-01-25 DIAGNOSIS — E7800 Pure hypercholesterolemia, unspecified: Secondary | ICD-10-CM | POA: Diagnosis not present

## 2020-01-25 DIAGNOSIS — Z1231 Encounter for screening mammogram for malignant neoplasm of breast: Secondary | ICD-10-CM | POA: Diagnosis not present

## 2020-01-25 LAB — CBC WITH DIFFERENTIAL/PLATELET
Absolute Monocytes: 880 cells/uL (ref 200–950)
Basophils Absolute: 77 cells/uL (ref 0–200)
Basophils Relative: 0.7 %
Eosinophils Absolute: 253 cells/uL (ref 15–500)
Eosinophils Relative: 2.3 %
HCT: 37.4 % (ref 35.0–45.0)
Hemoglobin: 12.7 g/dL (ref 11.7–15.5)
Lymphs Abs: 2904 cells/uL (ref 850–3900)
MCH: 30.6 pg (ref 27.0–33.0)
MCHC: 34 g/dL (ref 32.0–36.0)
MCV: 90.1 fL (ref 80.0–100.0)
MPV: 11 fL (ref 7.5–12.5)
Monocytes Relative: 8 %
Neutro Abs: 6886 cells/uL (ref 1500–7800)
Neutrophils Relative %: 62.6 %
Platelets: 355 10*3/uL (ref 140–400)
RBC: 4.15 10*6/uL (ref 3.80–5.10)
RDW: 10.8 % — ABNORMAL LOW (ref 11.0–15.0)
Total Lymphocyte: 26.4 %
WBC: 11 10*3/uL — ABNORMAL HIGH (ref 3.8–10.8)

## 2020-01-25 LAB — LIPID PANEL W/REFLEX DIRECT LDL
Cholesterol: 275 mg/dL — ABNORMAL HIGH (ref <200)
HDL: 91 mg/dL (ref >=50)
LDL Cholesterol (Calc): 155 mg/dL (calc) — ABNORMAL HIGH
Non-HDL Cholesterol (Calc): 184 mg/dL (calc) — ABNORMAL HIGH (ref <130)
Total CHOL/HDL Ratio: 3 (calc) (ref <5.0)
Triglycerides: 157 mg/dL — ABNORMAL HIGH (ref <150)

## 2020-01-25 NOTE — Progress Notes (Signed)
Subjective:     Jennifer Abbott is a 39 y.o. female and is here for a comprehensive physical exam. The patient reports no problems.  Social History   Socioeconomic History   Marital status: Single    Spouse name: Not on file   Number of children: 2   Years of education: Not on file   Highest education level: Not on file  Occupational History   Not on file  Tobacco Use   Smoking status: Former Smoker   Smokeless tobacco: Never Used  Scientific laboratory technician Use: Never used  Substance and Sexual Activity   Alcohol use: Yes    Alcohol/week: 0.0 standard drinks    Comment: 12 beers per week   Drug use: Not on file   Sexual activity: Never  Other Topics Concern   Not on file  Social History Narrative   Not on file   Social Determinants of Health   Financial Resource Strain:    Difficulty of Paying Living Expenses:   Food Insecurity:    Worried About Charity fundraiser in the Last Year:    Arboriculturist in the Last Year:   Transportation Needs:    Film/video editor (Medical):    Lack of Transportation (Non-Medical):   Physical Activity:    Days of Exercise per Week:    Minutes of Exercise per Session:   Stress:    Feeling of Stress :   Social Connections:    Frequency of Communication with Friends and Family:    Frequency of Social Gatherings with Friends and Family:    Attends Religious Services:    Active Member of Clubs or Organizations:    Attends Music therapist:    Marital Status:   Intimate Partner Violence:    Fear of Current or Ex-Partner:    Emotionally Abused:    Physically Abused:    Sexually Abused:    Health Maintenance  Topic Date Due   COVID-19 Vaccine (1) 07/25/2020 (Originally 08/17/1992)   PAP SMEAR-Modifier  01/23/2021 (Originally 01/24/2016)   Hepatitis C Screening  01/24/2021 (Originally 1981-06-13)   INFLUENZA VACCINE  02/24/2020   TETANUS/TDAP  07/26/2021   HIV Screening  Completed     The following portions of the patient's history were reviewed and updated as appropriate: allergies, current medications, past family history, past medical history, past social history, past surgical history and problem list.  Review of Systems Pertinent items noted in HPI and remainder of comprehensive ROS otherwise negative.   Objective:    BP 97/68    Pulse 93    Ht 5' 5.5" (1.664 m)    Wt 144 lb (65.3 kg)    BMI 23.60 kg/m  General appearance: alert, cooperative and appears stated age Head: Normocephalic, without obvious abnormality, atraumatic Eyes: conjunctivae/corneas clear. PERRL, EOM's intact. Fundi benign. Ears: normal TM's and external ear canals both ears Nose: Nares normal. Septum midline. Mucosa normal. No drainage or sinus tenderness. Throat: lips, mucosa, and tongue normal; teeth and gums normal Neck: no adenopathy, no carotid bruit, no JVD, supple, symmetrical, trachea midline and thyroid not enlarged, symmetric, no tenderness/mass/nodules Back: symmetric, no curvature. ROM normal. No CVA tenderness. Lungs: clear to auscultation bilaterally Heart: regular rate and rhythm, S1, S2 normal, no murmur, click, rub or gallop Abdomen: soft, non-tender; bowel sounds normal; no masses,  no organomegaly Extremities: extremities normal, atraumatic, no cyanosis or edema Pulses: 2+ and symmetric Skin: Skin color, texture, turgor normal. No rashes  or lesions Lymph nodes: Cervical, supraclavicular, and axillary nodes normal. Neurologic: Alert and oriented X 3, normal strength and tone. Normal symmetric reflexes. Normal coordination and gait   .Marland Kitchen Depression screen [facility] 2/9 01/28/2019  Decreased Interest 0  Down, Depressed, Hopeless 0  PHQ - 2 Score 0  Altered sleeping 0  Tired, decreased energy 0  Change in appetite 0  Feeling bad or failure about yourself  1  Trouble concentrating 0  Moving slowly or fidgety/restless 0  Suicidal thoughts 0  PHQ-9 Score 1  Difficult doing  work/chores Not difficult at all  Some encounter information is confidential and restricted. Go to Review Flowsheets activity to see all data.   Marland Kitchen.No flowsheet data found.    Assessment:    Healthy female exam.     Plan:    Marland KitchenMarland KitchenAmber was seen today for annual exam.  Diagnoses and all orders for this visit:  Routine physical examination -     CBC with Differential/Platelet -     Lipid Panel w/o Chol/HDL Ratio -     MM 3D SCREEN BREAST BILATERAL  History of ITP -     CBC with Differential/Platelet  Elevated LDL cholesterol level -     Lipid Panel w/o Chol/HDL Ratio -     Lipid Panel w/reflex Direct LDL  Visit for screening mammogram -     MM 3D SCREEN BREAST BILATERAL   .Marland Kitchen Discussed 150 minutes of exercise a week.  Encouraged vitamin D 1000 units and Calcium 1300mg  or 4 servings of dairy a day.  Fasting labs ordered.  Mammogram ordered.  Needs pap. Declined today. On OCP.   Discussed weight.she is normal BMI but heaviest she has been in her life.  Continues on phentermine. Discussed wellbutrin trial or lower dose phentermine more long term.  Marland Kitchen.Discussed low carb diet with 1500 calories and 80g of protein.  Exercising at least 150 minutes a week.  My Fitness Pal could be a Microbiologist.   See After Visit Summary for Counseling Recommendations

## 2020-01-25 NOTE — Patient Instructions (Signed)
Health Maintenance, Female Adopting a healthy lifestyle and getting preventive care are important in promoting health and wellness. Ask your health care provider about:  The right schedule for you to have regular tests and exams.  Things you can do on your own to prevent diseases and keep yourself healthy. What should I know about diet, weight, and exercise? Eat a healthy diet   Eat a diet that includes plenty of vegetables, fruits, low-fat dairy products, and lean protein.  Do not eat a lot of foods that are high in solid fats, added sugars, or sodium. Maintain a healthy weight Body mass index (BMI) is used to identify weight problems. It estimates body fat based on height and weight. Your health care provider can help determine your BMI and help you achieve or maintain a healthy weight. Get regular exercise Get regular exercise. This is one of the most important things you can do for your health. Most adults should:  Exercise for at least 150 minutes each week. The exercise should increase your heart rate and make you sweat (moderate-intensity exercise).  Do strengthening exercises at least twice a week. This is in addition to the moderate-intensity exercise.  Spend less time sitting. Even light physical activity can be beneficial. Watch cholesterol and blood lipids Have your blood tested for lipids and cholesterol at 41 years of age, then have this test every 5 years. Have your cholesterol levels checked more often if:  Your lipid or cholesterol levels are high.  You are older than 40 years of age.  You are at high risk for heart disease. What should I know about cancer screening? Depending on your health history and family history, you may need to have cancer screening at various ages. This may include screening for:  Breast cancer.  Cervical cancer.  Colorectal cancer.  Skin cancer.  Lung cancer. What should I know about heart disease, diabetes, and high blood  pressure? Blood pressure and heart disease  High blood pressure causes heart disease and increases the risk of stroke. This is more likely to develop in people who have high blood pressure readings, are of African descent, or are overweight.  Have your blood pressure checked: ? Every 3-5 years if you are 18-39 years of age. ? Every year if you are 40 years old or older. Diabetes Have regular diabetes screenings. This checks your fasting blood sugar level. Have the screening done:  Once every three years after age 40 if you are at a normal weight and have a low risk for diabetes.  More often and at a younger age if you are overweight or have a high risk for diabetes. What should I know about preventing infection? Hepatitis B If you have a higher risk for hepatitis B, you should be screened for this virus. Talk with your health care provider to find out if you are at risk for hepatitis B infection. Hepatitis C Testing is recommended for:  Everyone born from 1945 through 1965.  Anyone with known risk factors for hepatitis C. Sexually transmitted infections (STIs)  Get screened for STIs, including gonorrhea and chlamydia, if: ? You are sexually active and are younger than 41 years of age. ? You are older than 41 years of age and your health care provider tells you that you are at risk for this type of infection. ? Your sexual activity has changed since you were last screened, and you are at increased risk for chlamydia or gonorrhea. Ask your health care provider if   you are at risk.  Ask your health care provider about whether you are at high risk for HIV. Your health care provider may recommend a prescription medicine to help prevent HIV infection. If you choose to take medicine to prevent HIV, you should first get tested for HIV. You should then be tested every 3 months for as long as you are taking the medicine. Pregnancy  If you are about to stop having your period (premenopausal) and  you may become pregnant, seek counseling before you get pregnant.  Take 400 to 800 micrograms (mcg) of folic acid every day if you become pregnant.  Ask for birth control (contraception) if you want to prevent pregnancy. Osteoporosis and menopause Osteoporosis is a disease in which the bones lose minerals and strength with aging. This can result in bone fractures. If you are 65 years old or older, or if you are at risk for osteoporosis and fractures, ask your health care provider if you should:  Be screened for bone loss.  Take a calcium or vitamin D supplement to lower your risk of fractures.  Be given hormone replacement therapy (HRT) to treat symptoms of menopause. Follow these instructions at home: Lifestyle  Do not use any products that contain nicotine or tobacco, such as cigarettes, e-cigarettes, and chewing tobacco. If you need help quitting, ask your health care provider.  Do not use street drugs.  Do not share needles.  Ask your health care provider for help if you need support or information about quitting drugs. Alcohol use  Do not drink alcohol if: ? Your health care provider tells you not to drink. ? You are pregnant, may be pregnant, or are planning to become pregnant.  If you drink alcohol: ? Limit how much you use to 0-1 drink a day. ? Limit intake if you are breastfeeding.  Be aware of how much alcohol is in your drink. In the [place] one drink equals one 12 oz bottle of beer (355 mL), one 5 oz glass of wine (148 mL), or one 1 oz glass of hard liquor (44 mL). General instructions  Schedule regular health, dental, and eye exams.  Stay current with your vaccines.  Tell your health care provider if: ? You often feel depressed. ? You have ever been abused or do not feel safe at home. Summary  Adopting a healthy lifestyle and getting preventive care are important in promoting health and wellness.  Follow your health care provider's instructions about healthy  diet, exercising, and getting tested or screened for diseases.  Follow your health care provider's instructions on monitoring your cholesterol and blood pressure. This information is not intended to replace advice given to you by your health care provider. Make sure you discuss any questions you have with your health care provider. Document Revised: 07/05/2018 Document Reviewed: 07/05/2018 Elsevier Patient Education  2020 Elsevier Inc.  

## 2020-01-29 ENCOUNTER — Other Ambulatory Visit: Payer: Self-pay | Admitting: Physician Assistant

## 2020-01-29 NOTE — Progress Notes (Signed)
WBC up just a hair. I think you were a little sick last week and could be the reason fighting off viral infection.  HGB normal.  Platelets great.  LDL up again from 1 year ago. Consider watching your high cholesterol/processed foods.  Your HDL is still amazing though.  Your TG up a hair.  I don't think it has to be time for medication at this point but will continue to monitor.

## 2020-02-01 NOTE — Progress Notes (Signed)
HPI: Follow-up palpitations. Holter monitor August 2016 showed sinus rhythm with PACs and PVCs. Echocardiogram May 2021 showed normal LV function, mild mitral regurgitation.  Previous TSH, magnesium and potassium normal.  Since last seen she denies dyspnea, chest pain or syncope.  Her palpitations have improved and are now described as a skip.  Current Outpatient Medications  Medication Sig Dispense Refill   citalopram (CELEXA) 10 MG tablet Take 1 tablet (10 mg total) by mouth daily. 90 tablet 3   cyclobenzaprine (FLEXERIL) 10 MG tablet Take 1 tablet (10 mg total) by mouth 3 (three) times daily as needed for muscle spasms. 90 tablet 2   drospirenone-ethinyl estradiol (YAZ) 3-0.02 MG tablet Take 1 tablet by mouth daily. Please fill [place] only. Skip placebos. 3 Package 2   metoprolol succinate (TOPROL XL) 25 MG 24 hr tablet Take 1 tablet (25 mg total) by mouth daily. 90 tablet 3   metoprolol succinate (TOPROL XL) 50 MG 24 hr tablet Take 1 tablet (50 mg total) by mouth daily. Take with or immediately following a meal. 90 tablet 3   phentermine (ADIPEX-P) 37.5 MG tablet One tab by mouth qAM 30 tablet 0   rizatriptan (MAXALT-MLT) 10 MG disintegrating tablet Take 1 tablet (10 mg total) by mouth as needed for migraine. May repeat in 2 hours if needed 10 tablet 11   traZODone (DESYREL) 100 MG tablet Take 1 tablet (100 mg total) by mouth at bedtime. 90 tablet 3   No current facility-administered medications for this visit.     Past Medical History:  Diagnosis Date   Anemia    Anxiety    Blood transfusion without reported diagnosis    Clotting disorder ([place])    Colitis    Depression    GI bleed    History of ITP     Past Surgical History:  Procedure Laterality Date   BREAST ENHANCEMENT SURGERY Bilateral    2003   COSMETIC SURGERY     ovarirectomy      right for cyst    Social History   Socioeconomic History   Marital status: Single    Spouse name: Not on  file   Number of children: 2   Years of education: Not on file   Highest education level: Not on file  Occupational History   Not on file  Tobacco Use   Smoking status: Former Smoker   Smokeless tobacco: Never Used  Scientific laboratory technician Use: Never used  Substance and Sexual Activity   Alcohol use: Yes    Alcohol/week: 0.0 standard drinks    Comment: 12 beers per week   Drug use: Not on file   Sexual activity: Never  Other Topics Concern   Not on file  Social History Narrative   Not on file   Social Determinants of Health   Financial Resource Strain:    Difficulty of Paying Living Expenses:   Food Insecurity:    Worried About Charity fundraiser in the Last Year:    Arboriculturist in the Last Year:   Transportation Needs:    Film/video editor (Medical):    Lack of Transportation (Non-Medical):   Physical Activity:    Days of Exercise per Week:    Minutes of Exercise per Session:   Stress:    Feeling of Stress :   Social Connections:    Frequency of Communication with Friends and Family:    Frequency of Social Gatherings with  Friends and Family:    Attends Religious Services:    Active Member of Clubs or Organizations:    Attends Music therapist:    Marital Status:   Intimate Partner Violence:    Fear of Current or Ex-Partner:    Emotionally Abused:    Physically Abused:    Sexually Abused:     Family History  Problem Relation Age of Onset   Hyperlipidemia Father    Hypertension Father    Heart attack Father    Diabetes Maternal Grandfather    Heart attack Paternal Grandfather    Lung cancer Maternal Aunt    Cancer Maternal Uncle     ROS: no fevers or chills, productive cough, hemoptysis, dysphasia, odynophagia, melena, hematochezia, dysuria, hematuria, rash, seizure activity, orthopnea, PND, pedal edema, claudication. Remaining systems are negative.  Physical Exam: Well-developed well-nourished in  no acute distress.  Skin is warm and dry.  HEENT is normal.  Neck is supple.  Chest is clear to auscultation with normal expansion.  Cardiovascular exam is regular rate and rhythm.  Abdominal exam nontender or distended. No masses palpated. Extremities show no edema. neuro grossly intact   A/P  1 palpitations-symptoms have improved.  Continue Toprol at present dose.  Note electrolytes, TSH normal and echocardiogram showed normal LV function.  2 hyperlipidemia-recent labs reviewed and showed total cholesterol 275 with LDL 155 and HDL 91.  Given family history of coronary disease in her father in his late 60s I have recommended a statin.  She will review this with her primary care.  Kirk Ruths, MD

## 2020-02-06 ENCOUNTER — Other Ambulatory Visit: Payer: Self-pay

## 2020-02-06 ENCOUNTER — Ambulatory Visit (INDEPENDENT_AMBULATORY_CARE_PROVIDER_SITE_OTHER): Payer: No Typology Code available for payment source | Admitting: Cardiology

## 2020-02-06 ENCOUNTER — Encounter: Payer: Self-pay | Admitting: Cardiology

## 2020-02-06 VITALS — BP 115/78 | HR 82 | Ht 65.5 in | Wt 148.0 lb

## 2020-02-06 DIAGNOSIS — E7800 Pure hypercholesterolemia, unspecified: Secondary | ICD-10-CM

## 2020-02-06 DIAGNOSIS — R002 Palpitations: Secondary | ICD-10-CM | POA: Diagnosis not present

## 2020-02-06 NOTE — Patient Instructions (Signed)
Medication Instructions:  NO CHANGE *If you need a refill on your cardiac medications before your next appointment, please call your pharmacy*   Lab Work: If you have labs (blood work) drawn today and your tests are completely normal, you will receive your results only by: . MyChart Message (if you have MyChart) OR . A paper copy in the mail If you have any lab test that is abnormal or we need to change your treatment, we will call you to review the results.   Follow-Up: At [facility], you and your health needs are our priority.  As part of our continuing mission to provide you with exceptional heart care, we have created designated Provider Care Teams.  These Care Teams include your primary Cardiologist (physician) and Advanced Practice Providers (APPs -  Physician Assistants and Nurse Practitioners) who all work together to provide you with the care you need, when you need it.  We recommend signing up for the patient portal called "MyChart".  Sign up information is provided on this After Visit Summary.  MyChart is used to connect with patients for Virtual Visits (Telemedicine).  Patients are able to view lab/test results, encounter notes, upcoming appointments, etc.  Non-urgent messages can be sent to your provider as well.   To learn more about what you can do with MyChart, go to [email or web address].    Your next appointment:   12 month(s)  The format for your next appointment:   In Person  Provider:   Brian Crenshaw, MD    

## 2020-02-08 ENCOUNTER — Other Ambulatory Visit: Payer: Self-pay | Admitting: Physician Assistant

## 2020-02-08 ENCOUNTER — Telehealth: Payer: Self-pay | Admitting: Physician Assistant

## 2020-02-08 MED ORDER — AMBULATORY NON FORMULARY MEDICATION
0 refills | Status: DC
Start: 2020-02-08 — End: 2020-02-27

## 2020-02-08 MED ORDER — ATORVASTATIN CALCIUM 10 MG PO TABS
10.0000 mg | ORAL_TABLET | Freq: Every day | ORAL | 3 refills | Status: DC
Start: 2020-02-08 — End: 2020-02-08

## 2020-02-08 MED FILL — ATORVASTATIN CALCIUM 10 MG: 10 | 90 days supply | Qty: 90 | Fill #0

## 2020-02-08 NOTE — Telephone Encounter (Signed)
Family hx dyslipidemia Will start statin.

## 2020-02-13 ENCOUNTER — Other Ambulatory Visit: Payer: Self-pay | Admitting: Physician Assistant

## 2020-02-13 ENCOUNTER — Other Ambulatory Visit: Payer: Self-pay | Admitting: Neurology

## 2020-02-13 MED ORDER — DROSPIRENONE-ETHINYL ESTRADIOL 3-0.02 MG PO TABS: 1.0000 | ORAL_TABLET | Freq: Every day | ORAL | 3 refills | Status: DC

## 2020-02-13 MED FILL — DROSPIRENONE-EE 3-0.02 MG T: 3-0.02 | 63 days supply | Qty: 84 | Fill #0

## 2020-02-13 NOTE — Telephone Encounter (Signed)
Pharmacy called [facility] brand discontinued, patient will try other brand.

## 2020-02-25 ENCOUNTER — Other Ambulatory Visit: Payer: Self-pay | Admitting: Physician Assistant

## 2020-02-25 MED ORDER — WEGOVY 0.5 MG/0.5ML ~~LOC~~ SOAJ: 0.5000 mg | SUBCUTANEOUS | 0 refills | Status: DC

## 2020-02-25 NOTE — Progress Notes (Signed)
Doing great on wegovy. Tolerating well. Lost 4lbs first month.

## 2020-02-27 ENCOUNTER — Ambulatory Visit (INDEPENDENT_AMBULATORY_CARE_PROVIDER_SITE_OTHER): Payer: No Typology Code available for payment source | Admitting: Physician Assistant

## 2020-02-27 ENCOUNTER — Other Ambulatory Visit: Payer: Self-pay

## 2020-02-27 ENCOUNTER — Encounter: Payer: Self-pay | Admitting: Physician Assistant

## 2020-02-27 VITALS — BP 96/67 | HR 88 | Wt 141.0 lb

## 2020-02-27 DIAGNOSIS — L309 Dermatitis, unspecified: Secondary | ICD-10-CM

## 2020-02-27 MED ORDER — TRIAMCINOLONE ACETONIDE 0.1 % EX CREA: 1.0000 "application " | TOPICAL_CREAM | Freq: Two times a day (BID) | CUTANEOUS | 0 refills | Status: DC

## 2020-02-27 NOTE — Patient Instructions (Signed)

## 2020-02-27 NOTE — Progress Notes (Signed)
   Subjective:    Patient ID: Jennifer Abbott, female    DOB: 06-09-1981, 39 y.o.   MRN: [id number]  HPI Pt is a 39 yo female who presents to the clinic with a itchy rash in between breast and a little on chest. She denies any pain. It does burn a little when her perfume spray hits it. She is in the sun a lot and sweats. Not tried anything to make better. Present for the last few months. She does use a lot of lotions and creams but nothing new.   .. Active Ambulatory Problems    Diagnosis Date Noted  . Ventricular bigeminy 05/28/2013  . Abnormal weight gain 04/23/2014  . Depression 09/30/2014  . Intractable migraine without aura and with status migrainosus 09/30/2014  . History of ITP 02/20/2017  . History of lower GI bleeding 08/17/2017  . Trouble in sleeping 08/17/2017  . Inattention 08/17/2017  . Elevated LDL cholesterol level 01/13/2018  . Suspicious nevus 10/31/2018  . Dysmenorrhea 11/29/2018  . Orthostatic hypotension 05/25/2019  . History of 2019 novel coronavirus disease (COVID-19) 06/26/2019   Resolved Ambulatory Problems    Diagnosis Date Noted  . Migraine equivalent 12/07/2012  . Fracture of metatarsal of left foot, closed 05/28/2013  . IDA (iron deficiency anemia) 06/18/2013  . Paronychia 11/22/2013  . Cystitis 12/20/2013  . Vaginitis and vulvovaginitis 01/23/2014  . Foreign body in skin of finger 02/13/2014  . Skin tag 02/14/2014  . Mood changes ([place]) 09/30/2014  . Palpitations 03/03/2015  . Abnormal weight gain 09/08/2015  . Dental infection 10/24/2015  . Left maxillary sinusitis 10/24/2015  . Infection of tooth 10/27/2015  . Keratosis pilaris 11/27/2015  . Sleeping difficulties 04/16/2016  . Acute abdominal pain 02/14/2017  . Hematochezia 02/20/2017  . Acute blood loss anemia 02/20/2017  . Thrombocytopenia ([place]) 02/20/2017  . Colitis 02/20/2017  . Leukocytosis 02/20/2017  . Ulcerative pancolitis with complication ([place]) 99/35/7017  . No energy 04/26/2017   . GI bleed 08/17/2017  . Cat bite 12/22/2018   Past Medical History:  Diagnosis Date  . Anemia   . Anxiety   . Blood transfusion without reported diagnosis   . Clotting disorder ([place])       Review of Systems    see HPI.  Objective:   Physical Exam Cardiovascular:     Rate and Rhythm: Normal rate.  Chest:             Assessment & Plan:  Marland KitchenMarland KitchenDiagnoses and all orders for this visit:  Dermatitis -     triamcinolone cream (KENALOG) 0.1 %; Apply 1 application topically 2 (two) times daily.   Appears like a dermatitis to sun vs creams vs sweat vs lotions. Given topical steroid to use bid for next week or Abbott then as needed. Keep area dry and cool as possible. Avoid scented creams for a little while.

## 2020-03-13 ENCOUNTER — Ambulatory Visit: Payer: Self-pay

## 2020-03-13 DIAGNOSIS — R635 Abnormal weight gain: Secondary | ICD-10-CM

## 2020-03-13 NOTE — Addendum Note (Signed)
Addended by: Cyd Silence on: 03/13/2020 03:03 PM   Modules accepted: Orders

## 2020-03-13 NOTE — Addendum Note (Signed)
Addended by: Cyd Silence on: 03/13/2020 03:06 PM   Modules accepted: Orders

## 2020-03-27 ENCOUNTER — Other Ambulatory Visit: Payer: Self-pay | Admitting: Physician Assistant

## 2020-03-27 MED ORDER — WEGOVY 1 MG/0.5ML ~~LOC~~ SOAJ: 1.0000 mg | SUBCUTANEOUS | 0 refills | Status: DC

## 2020-03-27 NOTE — Progress Notes (Signed)
Pt doing well. Losing weight Tolerating well.

## 2020-04-15 ENCOUNTER — Other Ambulatory Visit: Payer: Self-pay | Admitting: Physician Assistant

## 2020-04-15 MED ORDER — WEGOVY 1.7 MG/0.75ML ~~LOC~~ SOAJ: 1.7000 mg | SUBCUTANEOUS | 2 refills | Status: DC

## 2020-04-16 MED FILL — METOPROLOL SUCCINATE ER 25: 25 | 90 days supply | Qty: 90 | Fill #1

## 2020-04-16 MED FILL — RIZATRIPTAN 10 MG ODT: 10 | 17 days supply | Qty: 10 | Fill #1

## 2020-04-16 MED FILL — METOPROLOL SUCCINATE ER 50: 50 | 90 days supply | Qty: 90 | Fill #1

## 2020-04-16 MED FILL — DROSPIRENONE-EE 3-0.02 MG T: 3-0.02 | 63 days supply | Qty: 84 | Fill #1

## 2020-04-23 ENCOUNTER — Other Ambulatory Visit: Payer: Self-pay | Admitting: Neurology

## 2020-04-23 MED ORDER — WEGOVY 1 MG/0.5ML ~~LOC~~ SOAJ: 1.0000 mg | SUBCUTANEOUS | 0 refills | Status: DC

## 2020-05-19 ENCOUNTER — Other Ambulatory Visit: Payer: Self-pay | Admitting: Physician Assistant

## 2020-05-19 ENCOUNTER — Other Ambulatory Visit: Payer: Self-pay | Admitting: Sports Medicine

## 2020-05-19 DIAGNOSIS — G479 Sleep disorder, unspecified: Secondary | ICD-10-CM

## 2020-05-19 MED FILL — ATORVASTATIN CALCIUM 10 MG: 10 | 90 days supply | Qty: 90 | Fill #1

## 2020-05-19 MED FILL — traZODone HCL 100 MG TABS: 100 | 90 days supply | Qty: 90 | Fill #0

## 2020-07-01 ENCOUNTER — Other Ambulatory Visit: Payer: Self-pay | Admitting: Nurse Practitioner

## 2020-07-01 DIAGNOSIS — F418 Other specified anxiety disorders: Secondary | ICD-10-CM

## 2020-07-01 MED ORDER — DIAZEPAM 5 MG PO TABS: 5.0000 mg | ORAL_TABLET | Freq: Two times a day (BID) | ORAL | 1 refills | Status: DC | PRN

## 2020-07-01 MED FILL — diazePAM 5 MG TABS: 5 | 7 days supply | Qty: 15 | Fill #0

## 2020-07-09 MED FILL — METOPROLOL SUCCINATE ER 25: 25 | 90 days supply | Qty: 90 | Fill #2

## 2020-07-09 MED FILL — DROSPIRENONE-EE 3-0.02 MG T: 3-0.02 | 63 days supply | Qty: 84 | Fill #2

## 2020-07-09 MED FILL — METOPROLOL SUCCINATE ER 50: 50 | 90 days supply | Qty: 90 | Fill #2

## 2020-07-21 ENCOUNTER — Ambulatory Visit (INDEPENDENT_AMBULATORY_CARE_PROVIDER_SITE_OTHER): Payer: No Typology Code available for payment source | Admitting: Sports Medicine

## 2020-07-21 ENCOUNTER — Ambulatory Visit (INDEPENDENT_AMBULATORY_CARE_PROVIDER_SITE_OTHER): Payer: No Typology Code available for payment source

## 2020-07-21 ENCOUNTER — Other Ambulatory Visit: Payer: Self-pay

## 2020-07-21 DIAGNOSIS — S322XXA Fracture of coccyx, initial encounter for closed fracture: Secondary | ICD-10-CM

## 2020-07-21 DIAGNOSIS — S3210XA Unspecified fracture of sacrum, initial encounter for closed fracture: Secondary | ICD-10-CM

## 2020-07-21 DIAGNOSIS — S3992XA Unspecified injury of lower back, initial encounter: Secondary | ICD-10-CM | POA: Diagnosis not present

## 2020-07-21 HISTORY — DX: Unspecified fracture of sacrum, initial encounter for closed fracture: S32.10XA

## 2020-07-21 NOTE — Progress Notes (Addendum)
    Procedures performed today:    None.  Independent interpretation of notes and tests performed by another provider:   X-rays personally reviewed, Luddie does have a deep cortical buckle fracture of her sacrum consistent with a sacral fracture.  Brief History, Exam, Impression, and Recommendations:    Fracture, sacrum/coccyx ([place]) This pleasant 39 year old female was on a hover board, she was trying to show off to her son and fell immediately onto her buttocks.  She had immediate pain, nausea, and presyncope. She went to sit down in immense pain. Pain was localized over the midline of the lower sacrum without radicular symptoms. On exam she has severe tenderness over the spinous processes of her mid to lower sacrum. No tenderness over the lumbar spinous processes, neurovascularly intact distally, adding some x-rays, we will probably do some pain medication and/or steroids if no fracture seen.  Unfortunately x-rays do confirm a buckle of the cortex of her sacrum consistent with sacral fracture. MRI we will discuss analgesics including tramadol and Tylenol. She was getting some dyspepsia from ibuprofen so I would hold off on NSAIDs for now.    ___________________________________________ Gwen Her. Dianah Field, M.D., ABFM., CAQSM. Primary Care and [place] Instructor of [employer] [facility] [employer]

## 2020-07-21 NOTE — Assessment & Plan Note (Addendum)
This pleasant 39 year old female was on a hover board, she was trying to show off to her son and fell immediately onto her buttocks.  She had immediate pain, nausea, and presyncope. She went to sit down in immense pain. Pain was localized over the midline of the lower sacrum without radicular symptoms. On exam she has severe tenderness over the spinous processes of her mid to lower sacrum. No tenderness over the lumbar spinous processes, neurovascularly intact distally, adding some x-rays, we will probably do some pain medication and/or steroids if no fracture seen.  Unfortunately x-rays do confirm a buckle of the cortex of her sacrum consistent with sacral fracture. MRI we will discuss analgesics including tramadol and Tylenol. She was getting some dyspepsia from ibuprofen so I would hold off on NSAIDs for now.

## 2020-09-18 MED FILL — ATORVASTATIN CALCIUM 10 MG: 10 | 90 days supply | Qty: 90 | Fill #2

## 2020-09-18 MED FILL — DROSPIRENONE-EE 3-0.02 MG T: 3-0.02 | 63 days supply | Qty: 84 | Fill #3

## 2020-09-23 ENCOUNTER — Other Ambulatory Visit (HOSPITAL_BASED_OUTPATIENT_CLINIC_OR_DEPARTMENT_OTHER): Payer: Self-pay | Admitting: Nurse Practitioner

## 2020-09-23 ENCOUNTER — Other Ambulatory Visit: Payer: Self-pay

## 2020-09-23 DIAGNOSIS — R635 Abnormal weight gain: Secondary | ICD-10-CM

## 2020-09-23 MED ORDER — SEMAGLUTIDE-WEIGHT MANAGEMENT 0.25 MG/0.5ML ~~LOC~~ SOAJ: 0.2500 mg | SUBCUTANEOUS | 0 refills | Status: AC

## 2020-09-23 MED ORDER — SEMAGLUTIDE-WEIGHT MANAGEMENT 0.5 MG/0.5ML ~~LOC~~ SOAJ: 0.5000 mg | SUBCUTANEOUS | 0 refills | Status: DC

## 2020-09-23 MED ORDER — SEMAGLUTIDE-WEIGHT MANAGEMENT 1 MG/0.5ML ~~LOC~~ SOAJ: 1.0000 mg | SUBCUTANEOUS | 0 refills | Status: AC

## 2020-09-23 MED ORDER — SEMAGLUTIDE-WEIGHT MANAGEMENT 2.4 MG/0.75ML ~~LOC~~ SOAJ: 2.4000 mg | SUBCUTANEOUS | 3 refills | Status: DC

## 2020-09-23 MED ORDER — SEMAGLUTIDE-WEIGHT MANAGEMENT 1.7 MG/0.75ML ~~LOC~~ SOAJ: 1.7000 mg | SUBCUTANEOUS | 0 refills | Status: AC

## 2020-09-23 NOTE — Progress Notes (Signed)
Patient contacted provider for restart of Wegovy for weight loss management. Current weight 140 lbs. She has had successful results with Wegovy in the past. No family or personal history of thyroid carcinomas or tumors. No history of pancreatitis.   Will plan for restart of Wegovy at 0.25mg  dose with step wise approach to increase to maximum dosage every 28 days. Weight checks every 4 weeks to monitor progress. Patient to notify provider of any side effects or concerns if they arise.

## 2020-10-13 MED FILL — METOPROLOL SUCCINATE ER 50: 50 | 90 days supply | Qty: 90 | Fill #3

## 2020-10-13 MED FILL — METOPROLOL SUCCINATE ER 25: 25 | 90 days supply | Qty: 90 | Fill #3

## 2020-10-27 ENCOUNTER — Other Ambulatory Visit: Payer: Self-pay | Admitting: Physician Assistant

## 2020-10-27 DIAGNOSIS — I498 Other specified cardiac arrhythmias: Secondary | ICD-10-CM

## 2020-10-27 DIAGNOSIS — R002 Palpitations: Secondary | ICD-10-CM

## 2020-10-27 NOTE — Progress Notes (Signed)
75mg  metoprolol having break through PVC since last Thursday with no other changes. Does not feel well.  Cardiology appt for Wednesday. Referral placed.  If having episodes will get EKG at office to send to cardiology.  BMP/CBC/TSH ordered for palpitations.

## 2020-10-28 ENCOUNTER — Other Ambulatory Visit: Payer: Self-pay | Admitting: Physician Assistant

## 2020-10-28 ENCOUNTER — Other Ambulatory Visit (HOSPITAL_BASED_OUTPATIENT_CLINIC_OR_DEPARTMENT_OTHER): Payer: Self-pay

## 2020-10-28 ENCOUNTER — Other Ambulatory Visit (INDEPENDENT_AMBULATORY_CARE_PROVIDER_SITE_OTHER): Payer: No Typology Code available for payment source

## 2020-10-28 ENCOUNTER — Telehealth: Payer: Self-pay | Admitting: Physician Assistant

## 2020-10-28 DIAGNOSIS — I493 Ventricular premature depolarization: Secondary | ICD-10-CM

## 2020-10-28 DIAGNOSIS — I498 Other specified cardiac arrhythmias: Secondary | ICD-10-CM

## 2020-10-28 DIAGNOSIS — R002 Palpitations: Secondary | ICD-10-CM

## 2020-10-28 LAB — BASIC METABOLIC PANEL WITH GFR
BUN: 11 mg/dL (ref 7–25)
CO2: 25 mmol/L (ref 20–32)
Calcium: 9.3 mg/dL (ref 8.6–10.2)
Chloride: 104 mmol/L (ref 98–110)
Creat: 0.6 mg/dL (ref 0.50–1.10)
GFR, Est African American: 130 mL/min/{1.73_m2} (ref >=60)
GFR, Est Non African American: 112 mL/min/{1.73_m2} (ref >=60)
Glucose, Bld: 94 mg/dL (ref 65–99)
Potassium: 3.4 mmol/L — ABNORMAL LOW (ref 3.5–5.3)
Sodium: 139 mmol/L (ref 135–146)

## 2020-10-28 LAB — CBC WITH DIFFERENTIAL/PLATELET
Absolute Monocytes: 650 cells/uL (ref 200–950)
Basophils Absolute: 51 cells/uL (ref 0–200)
Basophils Relative: 0.7 %
Eosinophils Absolute: 117 cells/uL (ref 15–500)
Eosinophils Relative: 1.6 %
HCT: 38.7 % (ref 35.0–45.0)
Hemoglobin: 13.1 g/dL (ref 11.7–15.5)
Lymphs Abs: 2051 cells/uL (ref 850–3900)
MCH: 30.7 pg (ref 27.0–33.0)
MCHC: 33.9 g/dL (ref 32.0–36.0)
MCV: 90.6 fL (ref 80.0–100.0)
MPV: 11.3 fL (ref 7.5–12.5)
Monocytes Relative: 8.9 %
Neutro Abs: 4431 cells/uL (ref 1500–7800)
Neutrophils Relative %: 60.7 %
Platelets: 283 10*3/uL (ref 140–400)
RBC: 4.27 10*6/uL (ref 3.80–5.10)
RDW: 11.1 % (ref 11.0–15.0)
Total Lymphocyte: 28.1 %
WBC: 7.3 10*3/uL (ref 3.8–10.8)

## 2020-10-28 LAB — TSH: TSH: 2.17 mIU/L

## 2020-10-28 MED ORDER — POTASSIUM CHLORIDE ER 10 MEQ PO TBCR
10.0000 meq | EXTENDED_RELEASE_TABLET | Freq: Every day | ORAL | 1 refills | Status: DC
  Filled 2020-10-28: qty 30, 30d supply, fill #0

## 2020-10-28 NOTE — Progress Notes (Signed)
ekg 

## 2020-10-28 NOTE — Telephone Encounter (Signed)
EKG showed PVCs with no acute changes. Will scan into EMR for wednesdays appt.

## 2020-10-28 NOTE — Progress Notes (Signed)
Potassium 3.4. 11 months ago over 4. Increase in PVC. Added potassium 102meq daily.

## 2020-10-28 NOTE — Progress Notes (Signed)
Your potassium is a little low and quite a big change from 11 months ago. I am going to give you 22meq of potassium and see if this helps you feel better. Discuss with cardiology.

## 2020-10-29 ENCOUNTER — Encounter: Payer: Self-pay | Admitting: *Deleted

## 2020-10-29 ENCOUNTER — Ambulatory Visit (INDEPENDENT_AMBULATORY_CARE_PROVIDER_SITE_OTHER): Payer: No Typology Code available for payment source

## 2020-10-29 ENCOUNTER — Ambulatory Visit (INDEPENDENT_AMBULATORY_CARE_PROVIDER_SITE_OTHER): Payer: No Typology Code available for payment source | Admitting: Cardiology

## 2020-10-29 ENCOUNTER — Other Ambulatory Visit: Payer: Self-pay

## 2020-10-29 ENCOUNTER — Encounter: Payer: Self-pay | Admitting: Cardiology

## 2020-10-29 VITALS — BP 104/70 | HR 73 | Ht 65.5 in | Wt 132.8 lb

## 2020-10-29 DIAGNOSIS — I493 Ventricular premature depolarization: Secondary | ICD-10-CM

## 2020-10-29 DIAGNOSIS — R002 Palpitations: Secondary | ICD-10-CM | POA: Diagnosis not present

## 2020-10-29 DIAGNOSIS — I498 Other specified cardiac arrhythmias: Secondary | ICD-10-CM

## 2020-10-29 DIAGNOSIS — E7800 Pure hypercholesterolemia, unspecified: Secondary | ICD-10-CM | POA: Diagnosis not present

## 2020-10-29 MED ORDER — METOPROLOL SUCCINATE ER 100 MG PO TB24: 100.0000 mg | ORAL_TABLET | Freq: Every day | ORAL | 3 refills | Status: DC

## 2020-10-29 NOTE — Patient Instructions (Signed)
Medication Instructions:   INCREASE METOPROLOL TO 100 MG ONCE DAILY  *If you need a refill on your cardiac medications before your next appointment, please call your pharmacy*   Lab Work:  Your physician recommends that you return for lab work FASTING  If you have labs (blood work) drawn today and your tests are completely normal, you will receive your results only by: Marland Kitchen MyChart Message (if you have MyChart) OR . A paper copy in the mail If you have any lab test that is abnormal or we need to change your treatment, we will call you to review the results.   Testing/Procedures:  Your physician has requested that you have an echocardiogram. Echocardiography is a painless test that uses sound waves to create images of your heart. It provides your doctor with information about the size and shape of your heart and how well your heart's chambers and valves are working. This procedure takes approximately one hour. There are no restrictions for this procedure.[place] [facility]  ZIO XT- Long Term Monitor Instructions   Your physician has requested you wear your ZIO patch monitor_3______days.   This is a single patch monitor.  [employer] supplies one patch monitor per enrollment.  Additional stickers are not available.   Please do not apply patch if you will be having a Nuclear Stress Test, Echocardiogram, Cardiac CT, MRI, or Chest Xray during the time frame you would be wearing the monitor. The patch cannot be worn during these tests.  You cannot remove and re-apply the ZIO XT patch monitor.   Your ZIO patch monitor will be sent USPS Priority mail from [facility] directly to your home address. The monitor may also be mailed to a PO BOX if home delivery is not available.   It may take 3-5 days to receive your monitor after you have been enrolled.   Once you have received you monitor, please review enclosed instructions.  Your monitor has already been registered assigning a specific monitor  serial # to you.   Applying the monitor   Shave hair from upper left chest.   Hold abrader disc by orange tab.  Rub abrader in 40 strokes over left upper chest as indicated in your monitor instructions.   Clean area with 4 enclosed alcohol pads .  Use all pads to assure are is cleaned thoroughly.  Let dry.   Apply patch as indicated in monitor instructions.  Patch will be place under collarbone on left side of chest with arrow pointing upward.   Rub patch adhesive wings for 2 minutes.Remove white label marked "1".  Remove white label marked "2".  Rub patch adhesive wings for 2 additional minutes.   While looking in a mirror, press and release button in center of patch.  A small green light will flash 3-4 times .  This will be your only indicator the monitor has been turned on.     Do not shower for the first 24 hours.  You may shower after the first 24 hours.   Press button if you feel a symptom. You will hear a small click.  Record Date, Time and Symptom in the Patient Log Book.   When you are ready to remove patch, follow instructions on last 2 pages of Patient Log Book.  Stick patch monitor onto last page of Patient Log Book.   Place Patient Log Book in [facility] box.  Use locking tab on box and tape box closed securely.  The [place] [employer] [place] box has  prepaid postage on it.  Please place in mailbox as soon as possible.  Your physician should have your test results approximately 7 days after the monitor has been mailed back to [facility].   Call [place] at [phone] if you have questions regarding your ZIO XT patch monitor.  Call them immediately if you see an orange light blinking on your monitor.   If your monitor falls off in less than 4 days contact our Monitor department at [phone].  If your monitor becomes loose or falls off after 4 days call [employer] at [phone] for suggestions on securing your monitor.        Follow-Up: At [facility], you  and your health needs are our priority.  As part of our continuing mission to provide you with exceptional heart care, we have created designated Provider Care Teams.  These Care Teams include your primary Cardiologist (physician) and Advanced Practice Providers (APPs -  Physician Assistants and Nurse Practitioners) who all work together to provide you with the care you need, when you need it.  We recommend signing up for the patient portal called "MyChart".  Sign up information is provided on this After Visit Summary.  MyChart is used to connect with patients for Virtual Visits (Telemedicine).  Patients are able to view lab/test results, encounter notes, upcoming appointments, etc.  Non-urgent messages can be sent to your provider as well.   To learn more about what you can do with MyChart, go to [email or web address].    Your next appointment:   3 month(s)  The format for your next appointment:   In Person  Provider:   Kirk Ruths, MD

## 2020-10-29 NOTE — Progress Notes (Signed)
Patient ID: Jennifer Abbott, female   DOB: [birth year], 40 y.o.   MRN: [id number] Patient enrolled for [employer] to ship a 3 day ZIO XT monitor to her home.

## 2020-10-29 NOTE — Progress Notes (Signed)
HPI: Follow-up palpitations. Holter monitor August 2016 showed sinus rhythm with PACs and PVCs. Echocardiogram May 2021 showed normal LV function, mild mitral regurgitation.  Previous TSH, magnesium and potassium normal.  Since last seen patient has worsening palpitations.  Over the past several weeks have increased in frequency and she has periods where she feels her heart "pounding ".  She denies dyspnea, chest pain or syncope.  Laboratories recently checked and potassium 3.4.  Supplement was prescribed by primary care.  Current Outpatient Medications  Medication Sig Dispense Refill  . atorvastatin (LIPITOR) 10 MG tablet TAKE 1 TABLET BY MOUTH ONCE DAILY 90 tablet 3  . drospirenone-ethinyl estradiol (YAZ) 3-0.02 MG tablet TAKE 1 TABLET BY MOUTH DAILY. SKIP PLACEBOS. 84 tablet 3  . rizatriptan (MAXALT-MLT) 10 MG disintegrating tablet Take 1 tablet (10 mg total) by mouth as needed for migraine. May repeat in 2 hours if needed 10 tablet 11  . metoprolol succinate (TOPROL-XL) 100 MG 24 hr tablet Take 1 tablet (100 mg total) by mouth daily. Take with or immediately following a meal. 90 tablet 3  . potassium chloride (KLOR-CON) 10 MEQ tablet Take 1 tablet (10 mEq total) by mouth daily. 30 tablet 1  . [START ON 11/20/2020] Semaglutide-Weight Management 1 MG/0.5ML SOAJ Inject 1 mg into the skin once a week for 28 days. (Patient not taking: Reported on 10/29/2020) 2 mL 0  . [START ON 12/19/2020] Semaglutide-Weight Management 1.7 MG/0.75ML SOAJ Inject 1.7 mg into the skin once a week for 28 days. (Patient not taking: Reported on 10/29/2020) 3 mL 0  . [START ON 01/17/2021] Semaglutide-Weight Management 2.4 MG/0.75ML SOAJ Inject 2.4 mg into the skin once a week for 28 days. (Patient not taking: Reported on 10/29/2020) 3 mL 3  . triamcinolone cream (KENALOG) 0.1 % Apply 1 application topically 2 (two) times daily. 60 g 0   No current facility-administered medications for this visit.     Past Medical History:   Diagnosis Date  . Anemia   . Anxiety   . Blood transfusion without reported diagnosis   . Clotting disorder ([place])   . Colitis   . Depression   . GI bleed   . History of ITP     Past Surgical History:  Procedure Laterality Date  . BREAST ENHANCEMENT SURGERY Bilateral    2003  . COSMETIC SURGERY    . ovarirectomy      right for cyst    Social History   Socioeconomic History  . Marital status: Single    Spouse name: Not on file  . Number of children: 2  . Years of education: Not on file  . Highest education level: Not on file  Occupational History  . Not on file  Tobacco Use  . Smoking status: Former Research scientist (life sciences)  . Smokeless tobacco: Never Used  Vaping Use  . Vaping Use: Never used  Substance and Sexual Activity  . Alcohol use: Yes    Alcohol/week: 0.0 standard drinks    Comment: 12 beers per week  . Drug use: Not on file  . Sexual activity: Never  Other Topics Concern  . Not on file  Social History Narrative  . Not on file   Social Determinants of Health   Financial Resource Strain: Not on file  Food Insecurity: Not on file  Transportation Needs: Not on file  Physical Activity: Not on file  Stress: Not on file  Social Connections: Not on file  Intimate Partner Violence: Not on file  Family History  Problem Relation Age of Onset  . Hyperlipidemia Father   . Hypertension Father   . Heart attack Father   . Diabetes Maternal Grandfather   . Heart attack Paternal Grandfather   . Lung cancer Maternal Aunt   . Cancer Maternal Uncle     ROS: no fevers or chills, productive cough, hemoptysis, dysphasia, odynophagia, melena, hematochezia, dysuria, hematuria, rash, seizure activity, orthopnea, PND, pedal edema, claudication. Remaining systems are negative.  Physical Exam: Well-developed well-nourished in no acute distress.  Skin is warm and dry.  HEENT is normal.  Neck is supple.  Chest is clear to auscultation with normal expansion.  Cardiovascular exam  is regular rate and rhythm.  Abdominal exam nontender or distended. No masses palpated. Extremities show no edema. neuro grossly intact  ECG-October 27, 2020-normal sinus rhythm, occasional PVC, prolonged QT interval, nonspecific ST changes.  Personally reviewed  A/P  1 palpitations-symptoms are worse.  She also feels her heart "pounding" at times.  We will arrange 3-day monitor to further assess.  Repeat echocardiogram (given frequency of ectopy I am concerned she may develop cardiomyopathy).  Increase Toprol to 100 mg daily.  May not be able to increase Toprol further as blood pressure is borderline.  If symptoms persist despite elevated dose of Toprol will likely ask electrophysiology to review (question addition of flecainide or consideration of ablation).  2 hyperlipidemia-continue Lipitor.  Check lipids and liver.  Kirk Ruths, MD

## 2020-10-31 ENCOUNTER — Encounter: Payer: Self-pay | Admitting: *Deleted

## 2020-10-31 LAB — HEPATIC FUNCTION PANEL
AG Ratio: 1.7 (calc) (ref 1.0–2.5)
ALT: 9 U/L (ref 6–29)
AST: 12 U/L (ref 10–30)
Albumin: 4.5 g/dL (ref 3.6–5.1)
Alkaline phosphatase (APISO): 38 U/L (ref 31–125)
Bilirubin, Direct: 0.1 mg/dL (ref 0.0–0.2)
Globulin: 2.6 g/dL (calc) (ref 1.9–3.7)
Indirect Bilirubin: 0.3 mg/dL (calc) (ref 0.2–1.2)
Total Bilirubin: 0.4 mg/dL (ref 0.2–1.2)
Total Protein: 7.1 g/dL (ref 6.1–8.1)

## 2020-10-31 LAB — LIPID PANEL
Cholesterol: 181 mg/dL (ref <200)
HDL: 86 mg/dL (ref >=50)
LDL Cholesterol (Calc): 72 mg/dL (calc)
Non-HDL Cholesterol (Calc): 95 mg/dL (calc) (ref <130)
Total CHOL/HDL Ratio: 2.1 (calc) (ref <5.0)
Triglycerides: 147 mg/dL (ref <150)

## 2020-11-01 DIAGNOSIS — I493 Ventricular premature depolarization: Secondary | ICD-10-CM

## 2020-11-01 DIAGNOSIS — R002 Palpitations: Secondary | ICD-10-CM | POA: Diagnosis not present

## 2020-11-01 DIAGNOSIS — I498 Other specified cardiac arrhythmias: Secondary | ICD-10-CM | POA: Diagnosis not present

## 2020-11-06 ENCOUNTER — Other Ambulatory Visit (HOSPITAL_BASED_OUTPATIENT_CLINIC_OR_DEPARTMENT_OTHER): Payer: Self-pay

## 2020-11-06 ENCOUNTER — Other Ambulatory Visit (HOSPITAL_COMMUNITY): Payer: Self-pay

## 2020-11-30 MED FILL — Atorvastatin Calcium Tab 10 MG (Base Equivalent): ORAL | 90 days supply | Qty: 90 | Fill #0 | Status: AC

## 2020-12-01 ENCOUNTER — Other Ambulatory Visit (HOSPITAL_BASED_OUTPATIENT_CLINIC_OR_DEPARTMENT_OTHER): Payer: Self-pay

## 2020-12-01 ENCOUNTER — Other Ambulatory Visit: Payer: Self-pay | Admitting: Physician Assistant

## 2020-12-01 MED ORDER — DROSPIRENONE-ETHINYL ESTRADIOL 3-0.02 MG PO TABS
ORAL_TABLET | ORAL | 3 refills | Status: DC
  Filled 2020-12-01: qty 112, 84d supply, fill #0

## 2020-12-02 ENCOUNTER — Other Ambulatory Visit (HOSPITAL_BASED_OUTPATIENT_CLINIC_OR_DEPARTMENT_OTHER): Payer: Self-pay

## 2020-12-05 ENCOUNTER — Ambulatory Visit (HOSPITAL_BASED_OUTPATIENT_CLINIC_OR_DEPARTMENT_OTHER)
Admission: RE | Admit: 2020-12-05 | Discharge: 2020-12-05 | Disposition: A | Discharge status: Home / Self Care | Payer: No Typology Code available for payment source | Source: Ambulatory Visit | Attending: Cardiology | Admitting: Cardiology

## 2020-12-05 ENCOUNTER — Other Ambulatory Visit: Payer: Self-pay

## 2020-12-05 DIAGNOSIS — I493 Ventricular premature depolarization: Secondary | ICD-10-CM | POA: Diagnosis present

## 2020-12-05 DIAGNOSIS — R002 Palpitations: Secondary | ICD-10-CM | POA: Diagnosis present

## 2020-12-05 DIAGNOSIS — I498 Other specified cardiac arrhythmias: Secondary | ICD-10-CM | POA: Insufficient documentation

## 2020-12-05 LAB — ECHOCARDIOGRAM COMPLETE
AR max vel: 1.56 cm2
AV Area VTI: 1.37 cm2
AV Area mean vel: 1.53 cm2
AV Mean grad: 2 mmHg
AV Peak grad: 2.9 mmHg
Ao pk vel: 0.85 m/s
Area-P 1/2: 3.19 cm2
S' Lateral: 3.28 cm
Single Plane A4C EF: 59 %

## 2020-12-05 NOTE — Progress Notes (Signed)
2D echocardiogram completed.  12/05/2020 9:11 AM Kelby Aline., MHA, RVT, RDCS, RDMS

## 2020-12-08 ENCOUNTER — Encounter: Payer: Self-pay | Admitting: Physician Assistant

## 2020-12-08 ENCOUNTER — Other Ambulatory Visit (HOSPITAL_COMMUNITY): Payer: Self-pay

## 2020-12-08 ENCOUNTER — Other Ambulatory Visit: Payer: Self-pay

## 2020-12-08 ENCOUNTER — Ambulatory Visit (INDEPENDENT_AMBULATORY_CARE_PROVIDER_SITE_OTHER): Payer: No Typology Code available for payment source | Admitting: Physician Assistant

## 2020-12-08 VITALS — BP 98/60 | HR 83 | Ht 65.5 in | Wt 126.0 lb

## 2020-12-08 DIAGNOSIS — L03032 Cellulitis of left toe: Secondary | ICD-10-CM

## 2020-12-08 MED ORDER — CICLOPIROX 8 % EX SOLN
Freq: Every day | CUTANEOUS | 0 refills | Status: DC
Start: 2020-12-08 — End: 2021-02-03
  Filled 2020-12-08: qty 6.6, 30d supply, fill #0

## 2020-12-09 ENCOUNTER — Encounter: Payer: Self-pay | Admitting: Physician Assistant

## 2020-12-09 DIAGNOSIS — L03032 Cellulitis of left toe: Secondary | ICD-10-CM | POA: Insufficient documentation

## 2020-12-09 NOTE — Progress Notes (Signed)
   Subjective:    Patient ID: Jennifer Abbott, female    DOB: 11-Jul-1981, 40 y.o.   MRN: [id number]  HPI  Pt is a 40 yo female with a concern to her great left toe. For about 1 month her left great toe has ached but no redness, discharge, swelling or known injury. A few weeks ago it stopped hurting but now she notices some clear fluid coming from nail cuticle with NO pain. Interesting her nail has not grown in over a month.   .. Active Ambulatory Problems    Diagnosis Date Noted  . Ventricular bigeminy 05/28/2013  . Abnormal weight gain 04/23/2014  . Depression 09/30/2014  . Intractable migraine without aura and with status migrainosus 09/30/2014  . Palpitations 03/03/2015  . History of ITP 02/20/2017  . History of lower GI bleeding 08/17/2017  . Trouble in sleeping 08/17/2017  . Inattention 08/17/2017  . Elevated LDL cholesterol level 01/13/2018  . Suspicious nevus 10/31/2018  . Dysmenorrhea 11/29/2018  . Orthostatic hypotension 05/25/2019  . History of 2019 novel coronavirus disease (COVID-19) 06/26/2019  . Fracture, sacrum/coccyx ([place]) 07/21/2020  . Paronychia of great toe of left foot 12/09/2020   Resolved Ambulatory Problems    Diagnosis Date Noted  . Migraine equivalent 12/07/2012  . Fracture of metatarsal of left foot, closed 05/28/2013  . IDA (iron deficiency anemia) 06/18/2013  . Paronychia 11/22/2013  . Cystitis 12/20/2013  . Vaginitis and vulvovaginitis 01/23/2014  . Foreign body in skin of finger 02/13/2014  . Skin tag 02/14/2014  . Mood changes ([place]) 09/30/2014  . Abnormal weight gain 09/08/2015  . Dental infection 10/24/2015  . Left maxillary sinusitis 10/24/2015  . Infection of tooth 10/27/2015  . Keratosis pilaris 11/27/2015  . Sleeping difficulties 04/16/2016  . Acute abdominal pain 02/14/2017  . Hematochezia 02/20/2017  . Acute blood loss anemia 02/20/2017  . Thrombocytopenia ([place]) 02/20/2017  . Colitis 02/20/2017  . Leukocytosis 02/20/2017  .  Ulcerative pancolitis with complication ([place]) 95/28/4132  . No energy 04/26/2017  . GI bleed 08/17/2017  . Cat bite 12/22/2018   Past Medical History:  Diagnosis Date  . Anemia   . Anxiety   . Blood transfusion without reported diagnosis   . Clotting disorder ([place])        Review of Systems See HPI.     Objective:   Physical Exam  Left great toe with no redness, tenderness, swelling. Polish is on nail. When pressed clear fluid comes out of nail cuticle. No palpable cyst. Nail is not dystrophic or thick. Does appear to be come dead skin around the cuticle.       Assessment & Plan:  Marland KitchenMarland KitchenAmber was seen today for toe pain.  Diagnoses and all orders for this visit:  Paronychia of great toe of left foot -     ciclopirox (PENLAC) 8 % solution; Apply topically at bedtime over nail and surrounding skin. Apply daily over previous coat. After seven (7) days, may remove with alcohol and continue cycle.   Unclear etiology of fluid coming from cuticle. No signs of infection. The hyperkeratotic skin could represent some fungus. Start penlac. Start epson water soaks for any inflammation.  Will reach out to podiatry for suggestions. No signs of ingrown toenail. More interesting her toenail is not growing. Suspect some type of trauma that caused this.

## 2020-12-25 ENCOUNTER — Other Ambulatory Visit (HOSPITAL_COMMUNITY): Payer: Self-pay

## 2020-12-25 ENCOUNTER — Other Ambulatory Visit: Payer: Self-pay

## 2020-12-25 DIAGNOSIS — I493 Ventricular premature depolarization: Secondary | ICD-10-CM

## 2020-12-25 DIAGNOSIS — R002 Palpitations: Secondary | ICD-10-CM

## 2020-12-25 DIAGNOSIS — I498 Other specified cardiac arrhythmias: Secondary | ICD-10-CM

## 2020-12-25 MED ORDER — METOPROLOL SUCCINATE ER 100 MG PO TB24
100.0000 mg | ORAL_TABLET | Freq: Every day | ORAL | 3 refills | Status: DC
  Filled 2020-12-25: qty 90, 90d supply, fill #0

## 2021-01-05 ENCOUNTER — Other Ambulatory Visit (HOSPITAL_BASED_OUTPATIENT_CLINIC_OR_DEPARTMENT_OTHER): Payer: Self-pay

## 2021-01-05 ENCOUNTER — Encounter: Payer: Self-pay | Admitting: Oncology

## 2021-01-27 ENCOUNTER — Encounter: Payer: No Typology Code available for payment source | Admitting: Physician Assistant

## 2021-01-30 ENCOUNTER — Encounter: Payer: Self-pay | Admitting: Oncology

## 2021-02-02 ENCOUNTER — Encounter: Payer: No Typology Code available for payment source | Admitting: Physician Assistant

## 2021-02-03 ENCOUNTER — Ambulatory Visit (INDEPENDENT_AMBULATORY_CARE_PROVIDER_SITE_OTHER): Payer: No Typology Code available for payment source | Admitting: Physician Assistant

## 2021-02-03 ENCOUNTER — Encounter: Payer: Self-pay | Admitting: Physician Assistant

## 2021-02-03 VITALS — BP 83/57 | HR 90 | Ht 65.5 in | Wt 122.0 lb

## 2021-02-03 DIAGNOSIS — Z0000 Encounter for general adult medical examination without abnormal findings: Secondary | ICD-10-CM | POA: Diagnosis not present

## 2021-02-03 NOTE — Patient Instructions (Signed)
Health Maintenance, Female Adopting a healthy lifestyle and getting preventive care are important in promoting health and wellness. Ask your health care provider about: The right schedule for you to have regular tests and exams. Things you can do on your own to prevent diseases and keep yourself healthy. What should I know about diet, weight, and exercise? Eat a healthy diet  Eat a diet that includes plenty of vegetables, fruits, low-fat dairy products, and lean protein. Do not eat a lot of foods that are high in solid fats, added sugars, or sodium.  Maintain a healthy weight Body mass index (BMI) is used to identify weight problems. It estimates body fat based on height and weight. Your health care provider can help determineyour BMI and help you achieve or maintain a healthy weight. Get regular exercise Get regular exercise. This is one of the most important things you can do for your health. Most adults should: Exercise for at least 150 minutes each week. The exercise should increase your heart rate and make you sweat (moderate-intensity exercise). Do strengthening exercises at least twice a week. This is in addition to the moderate-intensity exercise. Spend less time sitting. Even light physical activity can be beneficial. Watch cholesterol and blood lipids Have your blood tested for lipids and cholesterol at 42 years of age, then havethis test every 5 years. Have your cholesterol levels checked more often if: Your lipid or cholesterol levels are high. You are older than 42 years of age. You are at high risk for heart disease. What should I know about cancer screening? Depending on your health history and family history, you may need to have cancer screening at various ages. This may include screening for: Breast cancer. Cervical cancer. Colorectal cancer. Skin cancer. Lung cancer. What should I know about heart disease, diabetes, and high blood pressure? Blood pressure and heart  disease High blood pressure causes heart disease and increases the risk of stroke. This is more likely to develop in people who have high blood pressure readings, are of African descent, or are overweight. Have your blood pressure checked: Every 3-5 years if you are 18-39 years of age. Every year if you are 40 years old or older. Diabetes Have regular diabetes screenings. This checks your fasting blood sugar level. Have the screening done: Once every three years after age 40 if you are at a normal weight and have a low risk for diabetes. More often and at a younger age if you are overweight or have a high risk for diabetes. What should I know about preventing infection? Hepatitis B If you have a higher risk for hepatitis B, you should be screened for this virus. Talk with your health care provider to find out if you are at risk forhepatitis B infection. Hepatitis C Testing is recommended for: Everyone born from 1945 through 1965. Anyone with known risk factors for hepatitis C. Sexually transmitted infections (STIs) Get screened for STIs, including gonorrhea and chlamydia, if: You are sexually active and are younger than 42 years of age. You are older than 42 years of age and your health care provider tells you that you are at risk for this type of infection. Your sexual activity has changed since you were last screened, and you are at increased risk for chlamydia or gonorrhea. Ask your health care provider if you are at risk. Ask your health care provider about whether you are at high risk for HIV. Your health care provider may recommend a prescription medicine to help   prevent HIV infection. If you choose to take medicine to prevent HIV, you should first get tested for HIV. You should then be tested every 3 months for as long as you are taking the medicine. Pregnancy If you are about to stop having your period (premenopausal) and you may become pregnant, seek counseling before you get  pregnant. Take 400 to 800 micrograms (mcg) of folic acid every day if you become pregnant. Ask for birth control (contraception) if you want to prevent pregnancy. Osteoporosis and menopause Osteoporosis is a disease in which the bones lose minerals and strength with aging. This can result in bone fractures. If you are 65 years old or older, or if you are at risk for osteoporosis and fractures, ask your health care provider if you should: Be screened for bone loss. Take a calcium or vitamin D supplement to lower your risk of fractures. Be given hormone replacement therapy (HRT) to treat symptoms of menopause. Follow these instructions at home: Lifestyle Do not use any products that contain nicotine or tobacco, such as cigarettes, e-cigarettes, and chewing tobacco. If you need help quitting, ask your health care provider. Do not use street drugs. Do not share needles. Ask your health care provider for help if you need support or information about quitting drugs. Alcohol use Do not drink alcohol if: Your health care provider tells you not to drink. You are pregnant, may be pregnant, or are planning to become pregnant. If you drink alcohol: Limit how much you use to 0-1 drink a day. Limit intake if you are breastfeeding. Be aware of how much alcohol is in your drink. In the [place] one drink equals one 12 oz bottle of beer (355 mL), one 5 oz glass of wine (148 mL), or one 1 oz glass of hard liquor (44 mL). General instructions Schedule regular health, dental, and eye exams. Stay current with your vaccines. Tell your health care provider if: You often feel depressed. You have ever been abused or do not feel safe at home. Summary Adopting a healthy lifestyle and getting preventive care are important in promoting health and wellness. Follow your health care provider's instructions about healthy diet, exercising, and getting tested or screened for diseases. Follow your health care provider's  instructions on monitoring your cholesterol and blood pressure. This information is not intended to replace advice given to you by your health care provider. Make sure you discuss any questions you have with your healthcare provider. Document Revised: 07/05/2018 Document Reviewed: 07/05/2018 Elsevier Patient Education  2022 Elsevier Inc.  

## 2021-02-04 ENCOUNTER — Encounter: Payer: Self-pay | Admitting: Physician Assistant

## 2021-02-04 NOTE — Progress Notes (Signed)
Subjective:     Jennifer Abbott is a 40 y.o. female and is here for a comprehensive physical exam. The patient reports no problems.  Social History   Socioeconomic History   Marital status: Single    Spouse name: Not on file   Number of children: 2   Years of education: Not on file   Highest education level: Not on file  Occupational History   Not on file  Tobacco Use   Smoking status: Former    Pack years: 0.00   Smokeless tobacco: Never  Vaping Use   Vaping Use: Never used  Substance and Sexual Activity   Alcohol use: Yes    Alcohol/week: 0.0 standard drinks    Comment: 12 beers per week   Drug use: Not on file   Sexual activity: Never  Other Topics Concern   Not on file  Social History Narrative   Not on file   Social Determinants of Health   Financial Resource Strain: Not on file  Food Insecurity: Not on file  Transportation Needs: Not on file  Physical Activity: Not on file  Stress: Not on file  Social Connections: Not on file  Intimate Partner Violence: Not on file   Health Maintenance  Topic Date Due   Hepatitis C Screening  Never done   PAP SMEAR-Modifier  01/24/2016   COVID-19 Vaccine (3 - Pfizer risk series) 02/19/2021 (Originally 05/09/2020)   INFLUENZA VACCINE  02/23/2021   TETANUS/TDAP  07/26/2021   HIV Screening  Completed   HPV VACCINES  Aged Out   Pneumococcal Vaccine 40-30 Years old  Discontinued    The following portions of the patient's history were reviewed and updated as appropriate: allergies, current medications, past family history, past medical history, past social history, past surgical history, and problem list.  Review of Systems A comprehensive review of systems was negative.   Objective:    BP (!) 83/57   Pulse 90   Ht 5' 5.5" (1.664 m)   Wt 122 lb (55.3 kg)   BMI 19.99 kg/m  General appearance: alert, cooperative, and appears stated age Head: Normocephalic, without obvious abnormality, atraumatic Eyes:  conjunctivae/corneas clear. PERRL, EOM's intact. Fundi benign. Ears: normal TM's and external ear canals both ears Nose: Nares normal. Septum midline. Mucosa normal. No drainage or sinus tenderness. Throat: lips, mucosa, and tongue normal; teeth and gums normal Neck: no adenopathy, no carotid bruit, no JVD, supple, symmetrical, trachea midline, and thyroid not enlarged, symmetric, no tenderness/mass/nodules Back: symmetric, no curvature. ROM normal. No CVA tenderness. Lungs: clear to auscultation bilaterally Heart: regular rate and rhythm, S1, S2 normal, no murmur, click, rub or gallop Abdomen: soft, non-tender; bowel sounds normal; no masses,  no organomegaly Extremities: extremities normal, atraumatic, no cyanosis or edema Pulses: 2+ and symmetric Skin: Skin color, texture, turgor normal. No rashes or lesions Lymph nodes: Cervical, supraclavicular, and axillary nodes normal. Neurologic: Grossly normal   .. Depression screen [facility] 2/9 02/03/2021 01/28/2019  Decreased Interest 0 0  Down, Depressed, Hopeless 0 0  PHQ - 2 Score 0 0  Altered sleeping 0 0  Tired, decreased energy 2 0  Change in appetite 0 0  Feeling bad or failure about yourself  0 1  Trouble concentrating 0 0  Moving slowly or fidgety/restless 0 0  Suicidal thoughts 0 0  PHQ-9 Score 2 1  Difficult doing work/chores Not difficult at all Not difficult at all  Some encounter information is confidential and restricted. Go to Review Flowsheets activity  to see all data.    Assessment:    Healthy female exam.      Plan:    Marland KitchenMarland KitchenDiagnoses and all orders for this visit:  Routine physical examination   Discussed 150 minutes of exercise a week.  Encouraged vitamin D 1000 units and Calcium 1300mg  or 4 servings of dairy a day.  PHQ no concerns.  Fasting labs UTD.  Need mammogram.  Need pap.  BP low today. On BB for bigeminy/palpitations. See cardiology Monday. Some dizziness. Increase water.  Stay on statin.      See  After Visit Summary for Counseling Recommendations

## 2021-02-04 NOTE — Progress Notes (Signed)
HPI: Follow-up palpitations. Holter monitor August 2016 showed sinus rhythm with PACs and PVCs. Previous TSH, magnesium and potassium normal.  Palpitations worse at last office visit and Toprol increased.  Monitor shows sinus rhythm with frequent PVCs and occasional PAC.  Burden of PVCs 1.5%.  Echocardiogram repeated May 2022 and technically difficult but showed ejection fraction 50 to 55%.  Since last seen she states her palpitations have improved but her blood pressure is running low with systolic in the 63W.  She also feels as though she is losing some hair.  She denies dyspnea, chest pain or syncope.  Current Outpatient Medications  Medication Sig Dispense Refill   atorvastatin (LIPITOR) 10 MG tablet TAKE 1 TABLET BY MOUTH ONCE DAILY 90 tablet 3   drospirenone-ethinyl estradiol (YAZ) 3-0.02 MG tablet TAKE 1 TABLET BY MOUTH DAILY. SKIP PLACEBOS. 84 tablet 3   metoprolol succinate (TOPROL-XL) 100 MG 24 hr tablet Take 1 tablet (100 mg total) by mouth daily. Take with or immediately following a meal. 90 tablet 3   rizatriptan (MAXALT-MLT) 10 MG disintegrating tablet Take 1 tablet (10 mg total) by mouth as needed for migraine. May repeat in 2 hours if needed 10 tablet 11   No current facility-administered medications for this visit.     Past Medical History:  Diagnosis Date   Anemia    Anxiety    Blood transfusion without reported diagnosis    Clotting disorder ([place])    Colitis    Depression    GI bleed    History of ITP     Past Surgical History:  Procedure Laterality Date   BREAST ENHANCEMENT SURGERY Bilateral    2003   COSMETIC SURGERY     ovarirectomy      right for cyst    Social History   Socioeconomic History   Marital status: Single    Spouse name: Not on file   Number of children: 2   Years of education: Not on file   Highest education level: Not on file  Occupational History   Not on file  Tobacco Use   Smoking status: Former   Smokeless tobacco: Never   Vaping Use   Vaping Use: Never used  Substance and Sexual Activity   Alcohol use: Yes    Alcohol/week: 0.0 standard drinks    Comment: 12 beers per week   Drug use: Not on file   Sexual activity: Never  Other Topics Concern   Not on file  Social History Narrative   Not on file   Social Determinants of Health   Financial Resource Strain: Not on file  Food Insecurity: Not on file  Transportation Needs: Not on file  Physical Activity: Not on file  Stress: Not on file  Social Connections: Not on file  Intimate Partner Violence: Not on file    Family History  Problem Relation Age of Onset   Hyperlipidemia Father    Hypertension Father    Heart attack Father    Diabetes Maternal Grandfather    Heart attack Paternal Grandfather    Lung cancer Maternal Aunt    Cancer Maternal Uncle     ROS: no fevers or chills, productive cough, hemoptysis, dysphasia, odynophagia, melena, hematochezia, dysuria, hematuria, rash, seizure activity, orthopnea, PND, pedal edema, claudication. Remaining systems are negative.  Physical Exam: Well-developed well-nourished in no acute distress.  Skin is warm and dry.  HEENT is normal.  Neck is supple.  Chest is clear to auscultation with normal  expansion.  Cardiovascular exam is regular rate and rhythm.  Abdominal exam nontender or distended. No masses palpated. Extremities show no edema. neuro grossly intact  ECG- personally reviewed  A/P  1 palpitations-symptoms have improved on higher dose of Toprol but her blood pressure is now running low with associated fatigue and she is also losing hair by her report.  We will decrease to 75 mg daily for 3 days then 50 mg daily thereafter.  Note her LV function is normal.  If her symptoms worsen again on low-dose beta-blockade then we will consider referral to electrophysiology.  Question need for antiarrhythmic such as flecainide versus PVC ablation.  Her burden was not tremendously high.  2  hyperlipidemia-continue statin.  Kirk Ruths, MD

## 2021-02-09 ENCOUNTER — Ambulatory Visit (INDEPENDENT_AMBULATORY_CARE_PROVIDER_SITE_OTHER): Payer: No Typology Code available for payment source | Admitting: Cardiology

## 2021-02-09 ENCOUNTER — Encounter: Payer: Self-pay | Admitting: Cardiology

## 2021-02-09 ENCOUNTER — Other Ambulatory Visit: Payer: Self-pay

## 2021-02-09 DIAGNOSIS — I493 Ventricular premature depolarization: Secondary | ICD-10-CM

## 2021-02-09 DIAGNOSIS — R002 Palpitations: Secondary | ICD-10-CM | POA: Diagnosis not present

## 2021-02-09 DIAGNOSIS — I498 Other specified cardiac arrhythmias: Secondary | ICD-10-CM | POA: Diagnosis not present

## 2021-02-09 MED ORDER — METOPROLOL SUCCINATE ER 50 MG PO TB24: 100.0000 mg | ORAL_TABLET | Freq: Every day | ORAL | 3 refills | Status: DC

## 2021-02-09 NOTE — Patient Instructions (Signed)
Medication Instructions:   DECREASE METOPROLOL TO 75 MG FOR 2-3 DAYS THEN REDUCE TO 50 MG EVERYDAY  *If you need a refill on your cardiac medications before your next appointment, please call your pharmacy*   Follow-Up: At [facility], you and your health needs are our priority.  As part of our continuing mission to provide you with exceptional heart care, we have created designated Provider Care Teams.  These Care Teams include your primary Cardiologist (physician) and Advanced Practice Providers (APPs -  Physician Assistants and Nurse Practitioners) who all work together to provide you with the care you need, when you need it.  We recommend signing up for the patient portal called "MyChart".  Sign up information is provided on this After Visit Summary.  MyChart is used to connect with patients for Virtual Visits (Telemedicine).  Patients are able to view lab/test results, encounter notes, upcoming appointments, etc.  Non-urgent messages can be sent to your provider as well.   To learn more about what you can do with MyChart, go to [email or web address].    Your next appointment:   6 month(s)  The format for your next appointment:   In Person  Provider:   Kirk Ruths, MD

## 2021-02-23 ENCOUNTER — Other Ambulatory Visit: Payer: Self-pay | Admitting: Physician Assistant

## 2021-02-23 MED ORDER — WEGOVY 1.7 MG/0.75ML ~~LOC~~ SOAJ: 1.7000 mg | SUBCUTANEOUS | 2 refills | Status: DC

## 2021-02-27 ENCOUNTER — Other Ambulatory Visit (HOSPITAL_COMMUNITY): Payer: Self-pay

## 2021-02-27 ENCOUNTER — Other Ambulatory Visit: Payer: Self-pay

## 2021-02-27 MED ORDER — RIZATRIPTAN BENZOATE 10 MG PO TBDP
10.0000 mg | ORAL_TABLET | ORAL | 3 refills | Status: DC | PRN
  Filled 2021-02-27: qty 30, 90d supply, fill #0
  Filled 2022-01-02: qty 30, 90d supply, fill #1

## 2021-02-27 MED ORDER — DROSPIRENONE-ETHINYL ESTRADIOL 3-0.02 MG PO TABS
ORAL_TABLET | ORAL | 3 refills | Status: DC
  Filled 2021-02-27: qty 84, 72d supply, fill #0
  Filled 2021-05-17: qty 84, 72d supply, fill #1
  Filled 2021-07-27: qty 84, 72d supply, fill #2
  Filled 2021-10-11: qty 84, 72d supply, fill #3
  Filled 2021-10-12: qty 84, 72d supply, fill #0

## 2021-03-25 ENCOUNTER — Other Ambulatory Visit (HOSPITAL_COMMUNITY): Payer: Self-pay

## 2021-03-25 ENCOUNTER — Other Ambulatory Visit: Payer: Self-pay

## 2021-03-25 MED ORDER — ATORVASTATIN CALCIUM 10 MG PO TABS
ORAL_TABLET | Freq: Every day | ORAL | 3 refills | Status: DC
  Filled 2021-03-25: qty 90, 90d supply, fill #0
  Filled 2021-06-24: qty 90, 90d supply, fill #1
  Filled 2021-09-20: qty 90, 90d supply, fill #2
  Filled 2021-12-22 – 2021-12-23 (×2): qty 90, 90d supply, fill #3

## 2021-05-06 ENCOUNTER — Other Ambulatory Visit (HOSPITAL_COMMUNITY): Payer: Self-pay

## 2021-05-06 MED ORDER — METOPROLOL SUCCINATE ER 50 MG PO TB24
50.0000 mg | ORAL_TABLET | Freq: Every day | ORAL | 2 refills | Status: DC
Start: 2021-05-06 — End: 2021-06-30
  Filled 2021-05-06: qty 90, 90d supply, fill #0

## 2021-05-11 ENCOUNTER — Other Ambulatory Visit: Payer: Self-pay | Admitting: Physician Assistant

## 2021-05-11 DIAGNOSIS — I498 Other specified cardiac arrhythmias: Secondary | ICD-10-CM

## 2021-05-11 DIAGNOSIS — I493 Ventricular premature depolarization: Secondary | ICD-10-CM

## 2021-05-12 DIAGNOSIS — I493 Ventricular premature depolarization: Secondary | ICD-10-CM

## 2021-05-18 ENCOUNTER — Other Ambulatory Visit (HOSPITAL_COMMUNITY): Payer: Self-pay

## 2021-05-27 ENCOUNTER — Encounter: Payer: Self-pay | Admitting: Physician Assistant

## 2021-06-09 ENCOUNTER — Other Ambulatory Visit: Payer: Self-pay

## 2021-06-09 ENCOUNTER — Other Ambulatory Visit (HOSPITAL_COMMUNITY): Payer: Self-pay

## 2021-06-09 ENCOUNTER — Ambulatory Visit (INDEPENDENT_AMBULATORY_CARE_PROVIDER_SITE_OTHER): Payer: No Typology Code available for payment source | Admitting: Cardiology

## 2021-06-09 ENCOUNTER — Encounter: Payer: Self-pay | Admitting: Cardiology

## 2021-06-09 VITALS — BP 122/70 | HR 77 | Resp 18 | Ht 65.5 in | Wt 141.0 lb

## 2021-06-09 DIAGNOSIS — I493 Ventricular premature depolarization: Secondary | ICD-10-CM | POA: Diagnosis not present

## 2021-06-09 MED ORDER — FLECAINIDE ACETATE 50 MG PO TABS
50.0000 mg | ORAL_TABLET | Freq: Two times a day (BID) | ORAL | 3 refills | Status: DC
  Filled 2021-06-09: qty 60, 30d supply, fill #0
  Filled 2021-07-10 – 2021-07-16 (×2): qty 60, 30d supply, fill #1
  Filled 2021-08-17: qty 60, 30d supply, fill #2
  Filled 2021-09-17: qty 60, 30d supply, fill #3

## 2021-06-09 NOTE — Progress Notes (Signed)
Electrophysiology Office Note   Date:  06/09/2021   ID:  [name], DOB Nov 05, 1980, MRN [id number]  PCP:  Jennifer Stade, PA-C  Cardiologist:  Jennifer Abbott Primary Electrophysiologist:  Kerim Statzer Meredith Leeds, MD    Chief Complaint: PVCs   History of Present Illness: Jennifer Abbott is a 40 y.o. female who is being seen today for the evaluation of PVCs at the request of Jennifer Abbott, Jennifer Bors, MD. Presenting today for electrophysiology evaluation.  She has a history significant for ITP, colitis, GI bleeding, PVCs.  She wore a cardiac monitor with a PVC burden of 1.5% and occasional PACs.  Today, she denies symptoms of palpitations, chest pain, shortness of breath, orthopnea, PND, lower extremity edema, claudication, dizziness, presyncope, syncope, bleeding, or neurologic sequela. The patient is tolerating medications without difficulties.  She says her palpitations are intermittent.  She has a few weeks ago where she feels well, weeks or she has palpitations quite often.  She has not found anything that exacerbates or alleviates her symptoms.  She wears her smart watch at times to catch PVCs, but other times does not find any.  She has been adjusting the dose of her metoprolol intermittently, but has not had issues with low blood pressures.   Past Medical History:  Diagnosis Date   Anemia    Anxiety    Blood transfusion without reported diagnosis    Clotting disorder ([place])    Colitis    Depression    GI bleed    History of ITP    Past Surgical History:  Procedure Laterality Date   BREAST ENHANCEMENT SURGERY Bilateral    2003   COSMETIC SURGERY     ovarirectomy      right for cyst     Current Outpatient Medications  Medication Sig Dispense Refill   atorvastatin (LIPITOR) 10 MG tablet TAKE 1 TABLET BY MOUTH ONCE DAILY 90 tablet 3   drospirenone-ethinyl estradiol (YAZ) 3-0.02 MG tablet TAKE 1 TABLET BY MOUTH DAILY. SKIP PLACEBOS. 84 tablet 3   metoprolol succinate (TOPROL-XL)  50 MG 24 hr tablet Take 1 tablet (50 mg total) by mouth daily. Take with or immediately following a meal. 90 tablet 2   rizatriptan (MAXALT-MLT) 10 MG disintegrating tablet Take 1 tablet (10 mg total) by mouth as needed for migraine. May repeat in 2 hours if needed 30 tablet 3   No current facility-administered medications for this visit.    Allergies:   Topamax [topiramate]   Social History:  The patient  reports that she has quit smoking. She has never used smokeless tobacco. She reports current alcohol use.   Family History:  The patient's family history includes Cancer in her maternal uncle; Diabetes in her maternal grandfather; Heart attack in her father and paternal grandfather; Hyperlipidemia in her father; Hypertension in her father; Lung cancer in her maternal aunt.    ROS:  Please see the history of present illness.   Otherwise, review of systems is positive for none.   All other systems are reviewed and negative.    PHYSICAL EXAM: VS:  BP 122/70   Pulse 77   Resp 18   Ht 5' 5.5" (1.664 m)   Wt 141 lb (64 kg)   SpO2 98%   BMI 23.11 kg/m  , BMI Body mass index is 23.11 kg/m. GEN: Well nourished, well developed, in no acute distress  HEENT: normal  Neck: no JVD, carotid bruits, or masses Cardiac: RRR; no murmurs, rubs, or gallops,no edema  Respiratory:  clear to auscultation bilaterally, normal work of breathing GI: soft, nontender, nondistended, + BS MS: no deformity or atrophy  Skin: warm and dry Neuro:  Strength and sensation are intact Psych: euthymic mood, full affect  EKG:  EKG is not ordered today. Personal review of the ekg ordered 05/26/21 shows sinus rhythm, PVCs  Recent Labs: 10/27/2020: BUN 11; Creat 0.60; Hemoglobin 13.1; Platelets 283; Potassium 3.4; Sodium 139; TSH 2.17 10/29/2020: ALT 9    Lipid Panel     Component Value Date/Time   CHOL 181 10/29/2020 1402   TRIG 147 10/29/2020 1402   HDL 86 10/29/2020 1402   CHOLHDL 2.1 10/29/2020 1402   VLDL  19 04/16/2016 1025   LDLCALC 72 10/29/2020 1402     Wt Readings from Last 3 Encounters:  06/09/21 141 lb (64 kg)  02/09/21 119 lb (54 kg)  02/03/21 122 lb (55.3 kg)      Other studies Reviewed: Additional studies/ records that were reviewed today include: TTE 12/05/20  Review of the above records today demonstrates:   1. Only view with adequate endocardial definition was PS LAX, Definity  contrast was not given. Left ventricular ejection fraction, by estimation,  is 50 to 55%. The left ventricle has low normal function. Left ventricular  endocardial border not optimally   defined to evaluate regional wall motion. Left ventricular diastolic  parameters were normal.   2. Right ventricular systolic function was not well visualized. The right  ventricular size is normal. There is normal pulmonary artery systolic  pressure.   3. The mitral valve is normal in structure. No evidence of mitral valve  regurgitation. No evidence of mitral stenosis.   4. The aortic valve was not well visualized. Aortic valve regurgitation  is not visualized. No aortic stenosis is present.   Cardiac monitor 11/13/2020 personally reviewed Sinus rhythm with frequent PVCs and occasional PAC.  Burden of PVCs 1.5%, 6533.  ASSESSMENT AND PLAN:  1.  PVCs: Cardiac monitor with a burden of 1.5%.  She is currently on Toprol-XL 50 mg, but has been having fatigue with low blood pressures.  She also reports that her hair has been falling out.  Unfortunately due to her low burden, she would not be a good ablation candidate.  She does have symptoms despite her low burden.  Due to the start her on flecainide 50 mg.  She does not tolerate this dose, or has continued PVCs, we Sahib Pella likely plan to increase the dose to 100 mg.  If you increase 200 mg she Gregg Holster need an ETT.  2.  Hyperlipidemia: Continue atorvastatin 10 mg per primary care and primary cardiology  Case discussed with primary cardiology  Current medicines are  reviewed at length with the patient today.   The patient does not have concerns regarding her medicines.  The following changes were made today: Start flecainide  Labs/ tests ordered today include:  No orders of the defined types were placed in this encounter.    Disposition:   FU with Cruz Devilla 3 months  Signed, Shaunda Tipping Meredith Leeds, MD  06/09/2021 9:43 AM     [facility] [address] [place] [address] [phone] (office) [phone] (fax)

## 2021-06-09 NOTE — Patient Instructions (Addendum)
Medication Instructions:  Your physician has recommended you make the following change in your medication:  START Flecainide 50 mg twice daily  *If you need a refill on your cardiac medications before your next appointment, please call your pharmacy*   Lab Work: None ordered   Testing/Procedures: None ordered   Follow-Up: At [facility], you and your health needs are our priority.  As part of our continuing mission to provide you with exceptional heart care, we have created designated Provider Care Teams.  These Care Teams include your primary Cardiologist (physician) and Advanced Practice Providers (APPs -  Physician Assistants and Nurse Practitioners) who all work together to provide you with the care you need, when you need it.  Your next appointment:   3 month(s)  The format for your next appointment:   In Person  Provider:   Dr.  Curt Bears    Thank you for choosing [facility]!!   Trinidad Curet, RN [phone]   Other Instructions Flecainide Tablets What is this medication? FLECAINIDE (FLEK a nide) prevents and treats a fast or irregular heartbeat (arrhythmia). It is often used to treat a type of arrhythmia known as AFib (atrial fibrillation). It works by slowing down overactive electric signals in the heart, which stabilizes your heart rhythm. It belongs to a group of medications called antiarrhythmics. This medicine may be used for other purposes; ask your health care provider or pharmacist if you have questions. COMMON BRAND NAME(S): Tambocor What should I tell my care team before I take this medication? They need to know if you have any of these conditions: Abnormal levels of potassium in the blood Heart disease including heart rhythm and heart rate problems Kidney or liver disease Recent heart attack An unusual or allergic reaction to flecainide, local anesthetics, other medications, foods, dyes, or preservatives Pregnant or trying to get  pregnant Breast-feeding How should I use this medication? Take this medication by mouth with a glass of water. Follow the directions on the prescription label. You can take this medication with or without food. Take your doses at regular intervals. Do not take your medication more often than directed. Do not stop taking this medication suddenly. This may cause serious, heart-related side effects. If your care team wants you to stop the medication, the dose may be slowly lowered over time to avoid any side effects. Talk to your care team regarding the use of this medication in children. While this medication may be prescribed for children as young as 1 year of age for selected conditions, precautions do apply. Overdosage: If you think you have taken too much of this medicine contact a poison control center or emergency room at once. NOTE: This medicine is only for you. Do not share this medicine with others. What if I miss a dose? If you miss a dose, take it as soon as you can. If it is almost time for your next dose, take only that dose. Do not take double or extra doses. What may interact with this medication? Do not take this medication with any of the following: Amoxapine Arsenic trioxide Certain antibiotics like clarithromycin, erythromycin, gatifloxacin, gemifloxacin, levofloxacin, moxifloxacin, sparfloxacin, or troleandomycin Certain antidepressants called tricyclic antidepressants like amitriptyline, imipramine, or nortriptyline Certain medications to control heart rhythm like disopyramide, encainide, moricizine, procainamide, propafenone, and quinidine Cisapride Delavirdine Droperidol Haloperidol [place] Imatinib Levomethadyl Maprotiline Medications for malaria like chloroquine and halofantrine Pentamidine Phenothiazines like chlorpromazine, mesoridazine, prochlorperazine, thioridazine Pimozide Quinine Ranolazine Ritonavir Sertindole This medication may also interact with  the  following: Cimetidine Dofetilide Medications for angina or high blood pressure Medications to control heart rhythm like amiodarone and digoxin Ziprasidone This list may not describe all possible interactions. Give your health care provider a list of all the medicines, herbs, non-prescription drugs, or dietary supplements you use. Also tell them if you smoke, drink alcohol, or use illegal drugs. Some items may interact with your medicine. What should I watch for while using this medication? Visit your care team for regular checks on your progress. Because your condition and the use of this medication carries some risk, it is a good idea to carry an identification card, necklace or bracelet with details of your condition, medications, and care team. Check your blood pressure and pulse rate regularly. Ask your care team what your blood pressure and pulse rate should be, and when you should contact them. Your care team also may schedule regular blood tests and electrocardiograms to check your progress. You may get drowsy or dizzy. Do not drive, use machinery, or do anything that needs mental alertness until you know how this medication affects you. Do not stand or sit up quickly, especially if you are an older patient. This reduces the risk of dizzy or fainting spells. Alcohol can make you more dizzy, increase flushing and rapid heartbeats. Avoid alcoholic drinks. What side effects may I notice from receiving this medication? Side effects that you should report to your care team as soon as possible: Allergic reactions--skin rash, itching, hives, swelling of the face, lips, tongue, or throat Heart failure--shortness of breath, swelling of the ankles, feet, or hands, sudden weight gain, unusual weakness or fatigue Heart rhythm changes--fast or irregular heartbeat, dizziness, feeling faint or lightheaded, chest pain, trouble breathing Liver injury--right upper belly pain, loss of appetite, nausea,  light-colored stool, dark yellow or brown urine, yellowing skin or eyes, unusual weakness or fatigue Side effects that usually do not require medical attention (report to your care team if they continue or are bothersome): Blurry vision Constipation Dizziness Fatigue Headache Nausea Tremors or shaking This list may not describe all possible side effects. Call your doctor for medical advice about side effects. You may report side effects to FDA at 1-800-FDA-1088. Where should I keep my medication? Keep out of the reach of children and pets. Store at room temperature between 15 and 30 degrees C (59 and 86 degrees F). Protect from light. Keep container tightly closed. Throw away any unused medication after the expiration date. NOTE: This sheet is a summary. It may not cover all possible information. If you have questions about this medicine, talk to your doctor, pharmacist, or health care provider.  2022 Elsevier/Gold Standard (2020-09-05 00:00:00)

## 2021-06-12 NOTE — Telephone Encounter (Signed)
Pt has not started Flecainide yet as she was unsure if she should take both Flecainide & Metoprolol.  OV note notes that pt is having low BP & hair loss and will start Flecainide. Does not mention stopping the BB. Will forward to Dr. Curt Bears for clarification --  Pt wondering can she not just take the Flecainide... does she need rate control too? Aware I will let her know once he advises. Pt will stop the BB for now, start Flecainide tonight until she hears back from our office.

## 2021-06-22 ENCOUNTER — Ambulatory Visit (INDEPENDENT_AMBULATORY_CARE_PROVIDER_SITE_OTHER): Payer: No Typology Code available for payment source | Admitting: Physician Assistant

## 2021-06-22 ENCOUNTER — Encounter: Payer: Self-pay | Admitting: Physician Assistant

## 2021-06-22 VITALS — BP 116/71 | HR 106 | Temp 98.7°F

## 2021-06-22 DIAGNOSIS — J101 Influenza due to other identified influenza virus with other respiratory manifestations: Secondary | ICD-10-CM | POA: Diagnosis not present

## 2021-06-22 NOTE — Patient Instructions (Addendum)
Influenza, Adult °Influenza, also called "the flu," is a viral infection that mainly affects the respiratory tract. This includes the lungs, nose, and throat. The flu spreads easily from person to person (is contagious). It causes common cold symptoms, along with high fever and body aches. °What are the causes? °This condition is caused by the influenza virus. You can get the virus by: °Breathing in droplets that are in the air from an infected person's cough or sneeze. °Touching something that has the virus on it (has been contaminated) and then touching your mouth, nose, or eyes. °What increases the risk? °The following factors may make you more likely to get the flu: °Not washing or sanitizing your hands often. °Having close contact with many people during cold and flu season. °Touching your mouth, eyes, or nose without first washing or sanitizing your hands. °Not getting an annual flu shot. °You may have a higher risk for the flu, including serious problems, such as a lung infection (pneumonia), if you: °Are older than 65. °Are pregnant. °Have a weakened disease-fighting system (immune system). This includes people who have HIV or AIDS, are on chemotherapy, or are taking medicines that reduce (suppress) the immune system. °Have a long-term (chronic) illness, such as heart disease, kidney disease, diabetes, or lung disease. °Have a liver disorder. °Are severely overweight (morbidly obese). °Have anemia. °Have asthma. °What are the signs or symptoms? °Symptoms of this condition usually begin suddenly and last 4-14 days. These may include: °Fever and chills. °Headaches, body aches, or muscle aches. °Sore throat. °Cough. °Runny or stuffy (congested) nose. °Chest discomfort. °Poor appetite. °Weakness or fatigue. °Dizziness. °Nausea or vomiting. °How is this diagnosed? °This condition may be diagnosed based on: °Your symptoms and medical history. °A physical exam. °Swabbing your nose or throat and testing the fluid  for the influenza virus. °How is this treated? °If the flu is diagnosed early, you can be treated with antiviral medicine that is given by mouth (orally) or through an IV. This can help reduce how severe the illness is and how long it lasts. °Taking care of yourself at home can help relieve symptoms. Your health care provider may recommend: °Taking over-the-counter medicines. °Drinking plenty of fluids. °In many cases, the flu goes away on its own. If you have severe symptoms or complications, you may be treated in a hospital. °Follow these instructions at home: °Activity °Rest as needed and get plenty of sleep. °Stay home from work or school as told by your health care provider. Unless you are visiting your health care provider, avoid leaving home until your fever has been gone for 24 hours without taking medicine. °Eating and drinking °Take an oral rehydration solution (ORS). This is a drink that is sold at pharmacies and retail stores. °Drink enough fluid to keep your urine pale yellow. °Drink clear fluids in small amounts as you are able. Clear fluids include water, ice chips, fruit juice mixed with water, and low-calorie sports drinks. °Eat bland, easy-to-digest foods in small amounts as you are able. These foods include bananas, applesauce, rice, lean meats, toast, and crackers. °Avoid drinking fluids that contain a lot of sugar or caffeine, such as energy drinks, regular sports drinks, and soda. °Avoid alcohol. °Avoid spicy or fatty foods. °General instructions °  °Take over-the-counter and prescription medicines only as told by your health care provider. °Use a cool mist humidifier to add humidity to the air in your home. This can make it easier to breathe. °When using a cool mist humidifier,   clean it daily. Empty the water and replace it with clean water. °Cover your mouth and nose when you cough or sneeze. °Wash your hands with soap and water often and for at least 20 seconds, especially after you cough or  sneeze. If soap and water are not available, use alcohol-based hand sanitizer. °Keep all follow-up visits. This is important. °How is this prevented? ° °Get an annual flu shot. This is usually available in late summer, fall, or winter. Ask your health care provider when you should get your flu shot. °Avoid contact with people who are sick during cold and flu season. This is generally fall and winter. °Contact a health care provider if: °You develop new symptoms. °You have: °Chest pain. °Diarrhea. °A fever. °Your cough gets worse. °You produce more mucus. °You feel nauseous or you vomit. °Get help right away if you: °Develop shortness of breath or have difficulty breathing. °Have skin or nails that turn a bluish color. °Have severe pain or stiffness in your neck. °Develop a sudden headache or sudden pain in your face or ear. °Cannot eat or drink without vomiting. °These symptoms may represent a serious problem that is an emergency. Do not wait to see if the symptoms will go away. Get medical help right away. Call your local emergency services (911 in the [place] Do not drive yourself to the hospital. °Summary °Influenza, also called "the flu," is a viral infection that primarily affects your respiratory tract. °Symptoms of the flu usually begin suddenly and last 4-14 days. °Getting an annual flu shot is the best way to prevent getting the flu. °Stay home from work or school as told by your health care provider. Unless you are visiting your health care provider, avoid leaving home until your fever has been gone for 24 hours without taking medicine. °Keep all follow-up visits. This is important. °This information is not intended to replace advice given to you by your health care provider. Make sure you discuss any questions you have with your health care provider. °Document Revised: 02/29/2020 Document Reviewed: 02/29/2020 °Elsevier Patient Education © 2022 Elsevier Inc. ° °

## 2021-06-22 NOTE — Progress Notes (Signed)
   Subjective:    Patient ID: Jennifer Abbott, female    DOB: Apr 13, 1981, 40 y.o.   MRN: [id number]  HPI Pt is a 40 yo female who presents to the clinic with 4 days of ST, headache, cough, body aches, chills. No sick contacts. She does work here in the clinic. Taking tylenol cold sinus severe. No SOB. She has had her flu shot. She has not tested for covid.   .. Active Ambulatory Problems    Diagnosis Date Noted   Ventricular bigeminy 05/28/2013   Abnormal weight gain 04/23/2014   Depression 09/30/2014   Intractable migraine without aura and with status migrainosus 09/30/2014   Palpitations 03/03/2015   History of ITP 02/20/2017   History of lower GI bleeding 08/17/2017   Trouble in sleeping 08/17/2017   Inattention 08/17/2017   Elevated LDL cholesterol level 01/13/2018   Suspicious nevus 10/31/2018   Dysmenorrhea 11/29/2018   Orthostatic hypotension 05/25/2019   History of 2019 novel coronavirus disease (COVID-19) 06/26/2019   Fracture, sacrum/coccyx ([place]) 07/21/2020   Paronychia of great toe of left foot 12/09/2020   Resolved Ambulatory Problems    Diagnosis Date Noted   Migraine equivalent 12/07/2012   Fracture of metatarsal of left foot, closed 05/28/2013   IDA (iron deficiency anemia) 06/18/2013   Paronychia 11/22/2013   Cystitis 12/20/2013   Vaginitis and vulvovaginitis 01/23/2014   Foreign body in skin of finger 02/13/2014   Skin tag 02/14/2014   Mood changes ([place]) 09/30/2014   Abnormal weight gain 09/08/2015   Dental infection 10/24/2015   Left maxillary sinusitis 10/24/2015   Infection of tooth 10/27/2015   Keratosis pilaris 11/27/2015   Sleeping difficulties 04/16/2016   Acute abdominal pain 02/14/2017   Hematochezia 02/20/2017   Acute blood loss anemia 02/20/2017   Thrombocytopenia ([place]) 02/20/2017   Colitis 02/20/2017   Leukocytosis 02/20/2017   Ulcerative pancolitis with complication ([place]) 09/47/0962   No energy 04/26/2017   GI bleed 08/17/2017   Cat  bite 12/22/2018   Past Medical History:  Diagnosis Date   Anemia    Anxiety    Blood transfusion without reported diagnosis    Clotting disorder ([place])        Review of Systems See HPI.     Objective:   Physical Exam Vitals reviewed.  HENT:     Head: Normocephalic.  Cardiovascular:     Rate and Rhythm: Regular rhythm. Tachycardia present.     Pulses: Normal pulses.  Pulmonary:     Effort: Pulmonary effort is normal.     Breath sounds: Normal breath sounds.  Musculoskeletal:     Right lower leg: No edema.     Left lower leg: No edema.  Lymphadenopathy:     Cervical: Cervical adenopathy present.  Neurological:     General: No focal deficit present.     Mental Status: She is alert and oriented to person, place, and time.  Psychiatric:        Mood and Affect: Mood normal.          Assessment & Plan:  Marland KitchenMarland KitchenDiagnoses and all orders for this visit:  Influenza B  Discussed symptomatic care.  Vitals look stable.  Out of window for tamiflu.  Out of work until Wednesday and 24 hours fever free.  Follow up as needed or if symptoms worsen.

## 2021-06-25 ENCOUNTER — Other Ambulatory Visit (HOSPITAL_COMMUNITY): Payer: Self-pay

## 2021-06-29 ENCOUNTER — Ambulatory Visit (INDEPENDENT_AMBULATORY_CARE_PROVIDER_SITE_OTHER): Payer: No Typology Code available for payment source | Admitting: Physician Assistant

## 2021-06-29 VITALS — BP 119/72 | HR 100 | Temp 98.5°F

## 2021-06-29 DIAGNOSIS — J320 Chronic maxillary sinusitis: Secondary | ICD-10-CM | POA: Diagnosis not present

## 2021-06-29 DIAGNOSIS — J101 Influenza due to other identified influenza virus with other respiratory manifestations: Secondary | ICD-10-CM | POA: Diagnosis not present

## 2021-06-29 MED ORDER — AMOXICILLIN 875 MG PO TABS
875.0000 mg | ORAL_TABLET | Freq: Two times a day (BID) | ORAL | 0 refills | Status: AC
Start: 2021-06-29 — End: 2021-07-09

## 2021-06-29 NOTE — Progress Notes (Signed)
   Subjective:    Patient ID: Jennifer Abbott, female    DOB: 02-Sep-1980, 40 y.o.   MRN: [id number]  HPI Pt is a 40 yo female who was dx with Flu last week. She has had symptoms for over 10 days. No recent fever, chills, body aches, SOB. She does have lingering cough, left sided facial pressure, left ear pressure. She has been taking mucinex and sudafed.     Review of Systems    See HPI.  Objective:   Physical Exam Vitals reviewed.  Constitutional:      Appearance: Normal appearance.  HENT:     Head: Normocephalic.     Comments: Left maxillary tenderness to palpation.     Right Ear: Tympanic membrane normal. There is no impacted cerumen.     Left Ear: Tympanic membrane normal. There is no impacted cerumen.     Ears:     Comments: Left erythematous canal    Nose: Congestion present.     Mouth/Throat:     Mouth: Mucous membranes are moist.     Pharynx: No posterior oropharyngeal erythema.  Eyes:     Conjunctiva/sclera: Conjunctivae normal.  Cardiovascular:     Rate and Rhythm: Normal rate and regular rhythm.  Pulmonary:     Effort: Pulmonary effort is normal.     Breath sounds: Normal breath sounds.  Musculoskeletal:     Cervical back: Neck supple.  Lymphadenopathy:     Cervical: Cervical adenopathy present.  Neurological:     General: No focal deficit present.     Mental Status: She is alert and oriented to person, place, and time.  Psychiatric:        Mood and Affect: Mood normal.          Assessment & Plan:  Marland KitchenMarland KitchenDiagnoses and all orders for this visit:  Left maxillary sinusitis -     amoxicillin (AMOXIL) 875 MG tablet; Take 1 tablet (875 mg total) by mouth 2 (two) times daily for 10 days.  Influenza B   More than 10 days after flu symptoms started and continues to have sinus symptoms.  Treated with amoxil for 10 days.  Use flonase daily.  Follow up as needed.

## 2021-06-30 ENCOUNTER — Encounter: Payer: Self-pay | Admitting: Physician Assistant

## 2021-07-11 ENCOUNTER — Other Ambulatory Visit (HOSPITAL_COMMUNITY): Payer: Self-pay

## 2021-07-16 ENCOUNTER — Other Ambulatory Visit (HOSPITAL_COMMUNITY): Payer: Self-pay

## 2021-07-16 ENCOUNTER — Other Ambulatory Visit (HOSPITAL_BASED_OUTPATIENT_CLINIC_OR_DEPARTMENT_OTHER): Payer: Self-pay

## 2021-07-22 ENCOUNTER — Other Ambulatory Visit (HOSPITAL_BASED_OUTPATIENT_CLINIC_OR_DEPARTMENT_OTHER): Payer: Self-pay

## 2021-07-22 ENCOUNTER — Encounter: Payer: Self-pay | Admitting: Physician Assistant

## 2021-07-22 ENCOUNTER — Telehealth (INDEPENDENT_AMBULATORY_CARE_PROVIDER_SITE_OTHER): Payer: No Typology Code available for payment source | Admitting: Physician Assistant

## 2021-07-22 DIAGNOSIS — M62830 Muscle spasm of back: Secondary | ICD-10-CM

## 2021-07-22 DIAGNOSIS — M545 Low back pain, unspecified: Secondary | ICD-10-CM

## 2021-07-22 MED ORDER — TRAMADOL HCL 50 MG PO TABS
50.0000 mg | ORAL_TABLET | Freq: Four times a day (QID) | ORAL | 0 refills | Status: DC | PRN
Start: 2021-07-22 — End: 2021-08-19
  Filled 2021-07-22: qty 21, 3d supply, fill #0

## 2021-07-22 MED ORDER — CYCLOBENZAPRINE HCL 5 MG PO TABS
ORAL_TABLET | ORAL | 0 refills | Status: DC
  Filled 2021-07-22: qty 30, 10d supply, fill #0

## 2021-07-22 MED ORDER — PREDNISONE 50 MG PO TABS
ORAL_TABLET | ORAL | 0 refills | Status: DC
  Filled 2021-07-22: qty 5, 5d supply, fill #0

## 2021-07-22 NOTE — Progress Notes (Signed)
..Virtual Visit via Telephone Note  I connected with Jennifer Abbott on 07/22/21 at  3:20 PM EST by telephone and verified that I am speaking with the correct person using two identifiers.  Location: Patient: home Provider: clinic  .Marland KitchenParticipating in visit:  Patient: Museum/gallery conservator Provider: Iran Planas PA-C   I discussed the limitations, risks, security and privacy concerns of performing an evaluation and management service by telephone and the availability of in person appointments. I also discussed with the patient that there may be a patient responsible charge related to this service. The patient expressed understanding and agreed to proceed.   History of Present Illness: Pt is a 40 yo female who calls into the clinic with sudden low back pain and spasms that start suddenly this morning. No known trauma/injury. She has had some low back spasms once or twice but quickly went away. Last night she did help her son hand black lights. She was fine when she went to bed and got up this morning. She leaned over her sink to wash her face and when she stood up sudden 10/10 pain. No radiation of pain down legs. No saddle anesthesia. No bowel or bladder dysfunction. Any movement causes pain. She is not able to stand up straight. Laying down feels the best. She is using heating pad and took 800mg  of ibuprofen but no significant relief.   .. Active Ambulatory Problems    Diagnosis Date Noted   Ventricular bigeminy 05/28/2013   Abnormal weight gain 04/23/2014   Depression 09/30/2014   Intractable migraine without aura and with status migrainosus 09/30/2014   Palpitations 03/03/2015   History of ITP 02/20/2017   History of lower GI bleeding 08/17/2017   Trouble in sleeping 08/17/2017   Inattention 08/17/2017   Elevated LDL cholesterol level 01/13/2018   Suspicious nevus 10/31/2018   Dysmenorrhea 11/29/2018   Orthostatic hypotension 05/25/2019   History of 2019 novel coronavirus disease (COVID-19)  06/26/2019   Fracture, sacrum/coccyx ([place]) 07/21/2020   Paronychia of great toe of left foot 12/09/2020   Resolved Ambulatory Problems    Diagnosis Date Noted   Migraine equivalent 12/07/2012   Fracture of metatarsal of left foot, closed 05/28/2013   IDA (iron deficiency anemia) 06/18/2013   Paronychia 11/22/2013   Cystitis 12/20/2013   Vaginitis and vulvovaginitis 01/23/2014   Foreign body in skin of finger 02/13/2014   Skin tag 02/14/2014   Mood changes ([place]) 09/30/2014   Abnormal weight gain 09/08/2015   Dental infection 10/24/2015   Left maxillary sinusitis 10/24/2015   Infection of tooth 10/27/2015   Keratosis pilaris 11/27/2015   Sleeping difficulties 04/16/2016   Acute abdominal pain 02/14/2017   Hematochezia 02/20/2017   Acute blood loss anemia 02/20/2017   Thrombocytopenia ([place]) 02/20/2017   Colitis 02/20/2017   Leukocytosis 02/20/2017   Ulcerative pancolitis with complication ([place]) 86/76/7209   No energy 04/26/2017   GI bleed 08/17/2017   Cat bite 12/22/2018   Past Medical History:  Diagnosis Date   Anemia    Anxiety    Blood transfusion without reported diagnosis    Clotting disorder ([place])         Observations/Objective: Pain in sound of voice with movement    Assessment and Plan: Marland KitchenMarland KitchenDiagnoses and all orders for this visit:  Acute bilateral low back pain without sciatica -     predniSONE (DELTASONE) 50 MG tablet; Take one tablet daily for 5 days. -     traMADol (ULTRAM) 50 MG tablet; Take 1-2 tablets (50-100 mg  total) by mouth every 6 (six) hours as needed for moderate pain. Maximum 6 tabs per day. -     cyclobenzaprine (FLEXERIL) 5 MG tablet; Take 1-2 tablets as needed up to three times a day. -     DG Lumbar Spine Complete; Future  Spasm of muscle of lower back -     predniSONE (DELTASONE) 50 MG tablet; Take one tablet daily for 5 days. -     traMADol (ULTRAM) 50 MG tablet; Take 1-2 tablets (50-100 mg total) by mouth every 6 (six) hours as needed  for moderate pain. Maximum 6 tabs per day. -     cyclobenzaprine (FLEXERIL) 5 MG tablet; Take 1-2 tablets as needed up to three times a day. -     DG Lumbar Spine Complete; Future   Note for work today and tomorrow. Sounds like possible disogenic pain with muscle spasms No red flags Start prednisone, flexeril, tramadol, heat, ice, discussed some stretches, tens unit .Marland KitchenPDMP reviewed during this encounter. Get lumbar xray Follow up as needed or with any red flag symptoms or if not improving.    Follow Up Instructions:    I discussed the assessment and treatment plan with the patient. The patient was provided an opportunity to ask questions and all were answered. The patient agreed with the plan and demonstrated an understanding of the instructions.   The patient was advised to call back or seek an in-person evaluation if the symptoms worsen or if the condition fails to improve as anticipated.  I provided 20 minutes of non-face-to-face time during this encounter.   Iran Planas, PA-C

## 2021-07-27 ENCOUNTER — Other Ambulatory Visit (HOSPITAL_COMMUNITY): Payer: Self-pay

## 2021-07-31 ENCOUNTER — Other Ambulatory Visit: Payer: Self-pay

## 2021-07-31 ENCOUNTER — Ambulatory Visit (INDEPENDENT_AMBULATORY_CARE_PROVIDER_SITE_OTHER): Payer: No Typology Code available for payment source

## 2021-07-31 DIAGNOSIS — M62830 Muscle spasm of back: Secondary | ICD-10-CM

## 2021-07-31 DIAGNOSIS — M545 Low back pain, unspecified: Secondary | ICD-10-CM

## 2021-07-31 NOTE — Progress Notes (Signed)
GREAT news your spine looks really good. This is muscular. Do the exercises. Get the tens unit. Get a massage.

## 2021-08-03 ENCOUNTER — Encounter: Payer: Self-pay | Admitting: Oncology

## 2021-08-11 ENCOUNTER — Other Ambulatory Visit: Payer: Self-pay | Admitting: Physician Assistant

## 2021-08-17 ENCOUNTER — Other Ambulatory Visit (HOSPITAL_COMMUNITY): Payer: Self-pay

## 2021-08-19 ENCOUNTER — Other Ambulatory Visit (HOSPITAL_BASED_OUTPATIENT_CLINIC_OR_DEPARTMENT_OTHER): Payer: Self-pay

## 2021-08-19 ENCOUNTER — Encounter: Payer: Self-pay | Admitting: Oncology

## 2021-08-19 ENCOUNTER — Other Ambulatory Visit (HOSPITAL_BASED_OUTPATIENT_CLINIC_OR_DEPARTMENT_OTHER): Payer: Self-pay | Admitting: Nurse Practitioner

## 2021-08-19 DIAGNOSIS — E7800 Pure hypercholesterolemia, unspecified: Secondary | ICD-10-CM

## 2021-08-19 DIAGNOSIS — R635 Abnormal weight gain: Secondary | ICD-10-CM

## 2021-08-19 MED ORDER — SEMAGLUTIDE-WEIGHT MANAGEMENT 1 MG/0.5ML ~~LOC~~ SOAJ
1.0000 mg | SUBCUTANEOUS | 0 refills | Status: AC
  Filled 2021-08-19: qty 2, 28d supply, fill #0

## 2021-08-19 MED ORDER — SEMAGLUTIDE-WEIGHT MANAGEMENT 1.7 MG/0.75ML ~~LOC~~ SOAJ
1.7000 mg | SUBCUTANEOUS | 0 refills | Status: DC
  Filled 2021-08-19 – 2021-08-31 (×2): qty 3, 28d supply, fill #0

## 2021-08-19 MED ORDER — SEMAGLUTIDE-WEIGHT MANAGEMENT 2.4 MG/0.75ML ~~LOC~~ SOAJ
2.4000 mg | SUBCUTANEOUS | 4 refills | Status: DC
  Filled 2021-08-19: qty 3, fill #0
  Filled 2021-10-01: qty 3, 28d supply, fill #0
  Filled 2021-11-25: qty 3, 28d supply, fill #1
  Filled 2021-12-28: qty 3, 28d supply, fill #2
  Filled 2022-01-24 – 2022-01-29 (×2): qty 3, 28d supply, fill #3
  Filled 2022-02-23: qty 3, 28d supply, fill #4

## 2021-08-20 ENCOUNTER — Other Ambulatory Visit (HOSPITAL_BASED_OUTPATIENT_CLINIC_OR_DEPARTMENT_OTHER): Payer: Self-pay

## 2021-08-21 ENCOUNTER — Other Ambulatory Visit (HOSPITAL_COMMUNITY): Payer: Self-pay

## 2021-08-21 ENCOUNTER — Other Ambulatory Visit (HOSPITAL_BASED_OUTPATIENT_CLINIC_OR_DEPARTMENT_OTHER): Payer: Self-pay

## 2021-08-24 ENCOUNTER — Other Ambulatory Visit (HOSPITAL_BASED_OUTPATIENT_CLINIC_OR_DEPARTMENT_OTHER): Payer: Self-pay

## 2021-08-25 ENCOUNTER — Other Ambulatory Visit (HOSPITAL_BASED_OUTPATIENT_CLINIC_OR_DEPARTMENT_OTHER): Payer: Self-pay

## 2021-08-26 ENCOUNTER — Other Ambulatory Visit (HOSPITAL_BASED_OUTPATIENT_CLINIC_OR_DEPARTMENT_OTHER): Payer: Self-pay

## 2021-08-27 ENCOUNTER — Other Ambulatory Visit (HOSPITAL_BASED_OUTPATIENT_CLINIC_OR_DEPARTMENT_OTHER): Payer: Self-pay

## 2021-08-28 ENCOUNTER — Other Ambulatory Visit (HOSPITAL_BASED_OUTPATIENT_CLINIC_OR_DEPARTMENT_OTHER): Payer: Self-pay

## 2021-08-31 ENCOUNTER — Other Ambulatory Visit (HOSPITAL_BASED_OUTPATIENT_CLINIC_OR_DEPARTMENT_OTHER): Payer: Self-pay

## 2021-09-01 ENCOUNTER — Other Ambulatory Visit (HOSPITAL_BASED_OUTPATIENT_CLINIC_OR_DEPARTMENT_OTHER): Payer: Self-pay

## 2021-09-18 ENCOUNTER — Other Ambulatory Visit (HOSPITAL_COMMUNITY): Payer: Self-pay

## 2021-09-21 ENCOUNTER — Other Ambulatory Visit (HOSPITAL_COMMUNITY): Payer: Self-pay

## 2021-09-21 ENCOUNTER — Other Ambulatory Visit (HOSPITAL_BASED_OUTPATIENT_CLINIC_OR_DEPARTMENT_OTHER): Payer: Self-pay | Admitting: Nurse Practitioner

## 2021-09-21 ENCOUNTER — Other Ambulatory Visit (HOSPITAL_BASED_OUTPATIENT_CLINIC_OR_DEPARTMENT_OTHER): Payer: Self-pay

## 2021-09-21 ENCOUNTER — Ambulatory Visit: Payer: No Typology Code available for payment source | Admitting: Cardiology

## 2021-09-21 DIAGNOSIS — R635 Abnormal weight gain: Secondary | ICD-10-CM

## 2021-09-21 DIAGNOSIS — E7800 Pure hypercholesterolemia, unspecified: Secondary | ICD-10-CM

## 2021-09-21 MED ORDER — SEMAGLUTIDE-WEIGHT MANAGEMENT 1.7 MG/0.75ML ~~LOC~~ SOAJ
1.7000 mg | SUBCUTANEOUS | 2 refills | Status: AC
  Filled 2021-09-21 – 2021-10-05 (×4): qty 3, 28d supply, fill #0

## 2021-09-22 ENCOUNTER — Other Ambulatory Visit (HOSPITAL_BASED_OUTPATIENT_CLINIC_OR_DEPARTMENT_OTHER): Payer: Self-pay

## 2021-09-23 ENCOUNTER — Other Ambulatory Visit (HOSPITAL_BASED_OUTPATIENT_CLINIC_OR_DEPARTMENT_OTHER): Payer: Self-pay

## 2021-09-24 ENCOUNTER — Other Ambulatory Visit (HOSPITAL_BASED_OUTPATIENT_CLINIC_OR_DEPARTMENT_OTHER): Payer: Self-pay

## 2021-09-25 ENCOUNTER — Other Ambulatory Visit (HOSPITAL_BASED_OUTPATIENT_CLINIC_OR_DEPARTMENT_OTHER): Payer: Self-pay

## 2021-09-28 ENCOUNTER — Other Ambulatory Visit (HOSPITAL_BASED_OUTPATIENT_CLINIC_OR_DEPARTMENT_OTHER): Payer: Self-pay

## 2021-09-29 ENCOUNTER — Other Ambulatory Visit (HOSPITAL_BASED_OUTPATIENT_CLINIC_OR_DEPARTMENT_OTHER): Payer: Self-pay

## 2021-09-30 ENCOUNTER — Other Ambulatory Visit (HOSPITAL_BASED_OUTPATIENT_CLINIC_OR_DEPARTMENT_OTHER): Payer: Self-pay

## 2021-10-01 ENCOUNTER — Other Ambulatory Visit (HOSPITAL_BASED_OUTPATIENT_CLINIC_OR_DEPARTMENT_OTHER): Payer: Self-pay

## 2021-10-04 ENCOUNTER — Telehealth (HOSPITAL_BASED_OUTPATIENT_CLINIC_OR_DEPARTMENT_OTHER): Payer: Self-pay

## 2021-10-04 NOTE — Telephone Encounter (Signed)
Received fax from [place] that Mancel Parsons has been approved authorization # [id number]. ?

## 2021-10-05 ENCOUNTER — Other Ambulatory Visit (HOSPITAL_BASED_OUTPATIENT_CLINIC_OR_DEPARTMENT_OTHER): Payer: Self-pay

## 2021-10-12 ENCOUNTER — Other Ambulatory Visit (HOSPITAL_COMMUNITY): Payer: Self-pay

## 2021-10-12 ENCOUNTER — Other Ambulatory Visit (HOSPITAL_BASED_OUTPATIENT_CLINIC_OR_DEPARTMENT_OTHER): Payer: Self-pay

## 2021-10-13 ENCOUNTER — Other Ambulatory Visit (HOSPITAL_BASED_OUTPATIENT_CLINIC_OR_DEPARTMENT_OTHER): Payer: Self-pay

## 2021-10-13 ENCOUNTER — Telehealth: Payer: Self-pay | Admitting: Cardiology

## 2021-10-13 MED ORDER — FLECAINIDE ACETATE 50 MG PO TABS
50.0000 mg | ORAL_TABLET | Freq: Two times a day (BID) | ORAL | 0 refills | Status: DC
  Filled 2021-10-13: qty 180, 90d supply, fill #0

## 2021-10-13 NOTE — Telephone Encounter (Signed)
Rx refill sent to pharmacy. 

## 2021-10-13 NOTE — Telephone Encounter (Signed)
?*  STAT* If patient is at the pharmacy, call can be transferred to refill team. ? ? ?1. Which medications need to be refilled? (please list name of each medication and dose if known)  ?flecainide (TAMBOCOR) 50 MG tablet ? ? ?2. Which pharmacy/location (including street and city if local pharmacy) is medication to be sent to? ?[facility] ? ?3. Do they need a 30 day or 90 day supply?  ?90 day supply ? ?

## 2021-10-20 ENCOUNTER — Other Ambulatory Visit (HOSPITAL_BASED_OUTPATIENT_CLINIC_OR_DEPARTMENT_OTHER): Payer: Self-pay

## 2021-10-23 ENCOUNTER — Other Ambulatory Visit (HOSPITAL_BASED_OUTPATIENT_CLINIC_OR_DEPARTMENT_OTHER): Payer: Self-pay

## 2021-11-09 ENCOUNTER — Ambulatory Visit: Payer: No Typology Code available for payment source | Admitting: Cardiology

## 2021-11-16 ENCOUNTER — Other Ambulatory Visit (HOSPITAL_BASED_OUTPATIENT_CLINIC_OR_DEPARTMENT_OTHER): Payer: Self-pay

## 2021-11-23 ENCOUNTER — Ambulatory Visit: Payer: No Typology Code available for payment source | Admitting: Cardiology

## 2021-11-25 ENCOUNTER — Other Ambulatory Visit (HOSPITAL_BASED_OUTPATIENT_CLINIC_OR_DEPARTMENT_OTHER): Payer: Self-pay

## 2021-12-22 ENCOUNTER — Other Ambulatory Visit (HOSPITAL_BASED_OUTPATIENT_CLINIC_OR_DEPARTMENT_OTHER): Payer: Self-pay

## 2021-12-22 ENCOUNTER — Other Ambulatory Visit: Payer: Self-pay | Admitting: Neurology

## 2021-12-22 MED ORDER — DROSPIRENONE-ETHINYL ESTRADIOL 3-0.02 MG PO TABS
ORAL_TABLET | ORAL | 3 refills | Status: DC
  Filled 2021-12-22: qty 84, 72d supply, fill #0
  Filled 2022-03-01: qty 84, 72d supply, fill #1
  Filled 2022-05-11: qty 84, 72d supply, fill #2
  Filled 2022-07-21: qty 84, 72d supply, fill #3

## 2021-12-23 ENCOUNTER — Other Ambulatory Visit (HOSPITAL_COMMUNITY): Payer: Self-pay

## 2021-12-28 ENCOUNTER — Other Ambulatory Visit (HOSPITAL_BASED_OUTPATIENT_CLINIC_OR_DEPARTMENT_OTHER): Payer: Self-pay

## 2021-12-29 ENCOUNTER — Other Ambulatory Visit (HOSPITAL_BASED_OUTPATIENT_CLINIC_OR_DEPARTMENT_OTHER): Payer: Self-pay

## 2022-01-02 ENCOUNTER — Other Ambulatory Visit (HOSPITAL_COMMUNITY): Payer: Self-pay

## 2022-01-19 ENCOUNTER — Other Ambulatory Visit (HOSPITAL_COMMUNITY): Payer: Self-pay

## 2022-01-19 ENCOUNTER — Other Ambulatory Visit: Payer: Self-pay | Admitting: Cardiology

## 2022-01-19 ENCOUNTER — Other Ambulatory Visit (HOSPITAL_BASED_OUTPATIENT_CLINIC_OR_DEPARTMENT_OTHER): Payer: Self-pay

## 2022-01-19 MED ORDER — FLECAINIDE ACETATE 50 MG PO TABS
50.0000 mg | ORAL_TABLET | Freq: Two times a day (BID) | ORAL | 0 refills | Status: DC
  Filled 2022-01-19: qty 180, 90d supply, fill #0

## 2022-01-25 ENCOUNTER — Other Ambulatory Visit (HOSPITAL_BASED_OUTPATIENT_CLINIC_OR_DEPARTMENT_OTHER): Payer: Self-pay

## 2022-01-29 ENCOUNTER — Encounter: Payer: Self-pay | Admitting: Oncology

## 2022-01-29 ENCOUNTER — Other Ambulatory Visit (HOSPITAL_BASED_OUTPATIENT_CLINIC_OR_DEPARTMENT_OTHER): Payer: Self-pay

## 2022-02-12 ENCOUNTER — Encounter: Payer: Self-pay | Admitting: Physician Assistant

## 2022-02-12 ENCOUNTER — Other Ambulatory Visit (HOSPITAL_BASED_OUTPATIENT_CLINIC_OR_DEPARTMENT_OTHER): Payer: Self-pay

## 2022-02-12 ENCOUNTER — Ambulatory Visit (INDEPENDENT_AMBULATORY_CARE_PROVIDER_SITE_OTHER): Payer: No Typology Code available for payment source | Admitting: Physician Assistant

## 2022-02-12 VITALS — BP 113/82 | HR 100 | Ht 65.5 in | Wt 117.0 lb

## 2022-02-12 DIAGNOSIS — G43011 Migraine without aura, intractable, with status migrainosus: Secondary | ICD-10-CM

## 2022-02-12 DIAGNOSIS — Z0000 Encounter for general adult medical examination without abnormal findings: Secondary | ICD-10-CM | POA: Diagnosis not present

## 2022-02-12 DIAGNOSIS — E7800 Pure hypercholesterolemia, unspecified: Secondary | ICD-10-CM

## 2022-02-12 DIAGNOSIS — Z1329 Encounter for screening for other suspected endocrine disorder: Secondary | ICD-10-CM | POA: Diagnosis not present

## 2022-02-12 DIAGNOSIS — Z131 Encounter for screening for diabetes mellitus: Secondary | ICD-10-CM

## 2022-02-12 DIAGNOSIS — I498 Other specified cardiac arrhythmias: Secondary | ICD-10-CM

## 2022-02-12 DIAGNOSIS — R635 Abnormal weight gain: Secondary | ICD-10-CM

## 2022-02-12 MED ORDER — ATORVASTATIN CALCIUM 10 MG PO TABS
ORAL_TABLET | Freq: Every day | ORAL | 3 refills | Status: DC
  Filled 2022-02-12: qty 90, fill #0
  Filled 2022-03-22: qty 90, 90d supply, fill #0
  Filled 2022-06-20: qty 90, 90d supply, fill #1
  Filled 2022-09-16: qty 90, 90d supply, fill #2
  Filled 2022-12-22: qty 90, 90d supply, fill #3

## 2022-02-12 NOTE — Patient Instructions (Signed)

## 2022-02-12 NOTE — Progress Notes (Addendum)
Complete physical exam  Patient: Jennifer Abbott   DOB: [birth year]   41 y.o. Female  MRN: [id number]  Subjective:    Chief Complaint  Patient presents with   Annual Exam    Jennifer Abbott is a 41 y.o. female who presents today for a complete physical exam. She reports consuming a general diet. The patient does not participate in regular exercise at present. She generally feels well. She reports sleeping well. She does not have additional problems to discuss today.    Most recent fall risk assessment:    07/31/2015   10:26 AM  Fall Risk   Falls in the past year?      Information is confidential and restricted. Go to Review Flowsheets to unlock data.     Most recent depression screenings:    02/03/2021   11:19 AM 01/28/2019    9:35 AM  PHQ 2/9 Scores  PHQ - 2 Score 0 0  PHQ- 9 Score 2 1    Vision:Within last year and Dental: No current dental problems  Patient Active Problem List   Diagnosis Date Noted   Paronychia of great toe of left foot 12/09/2020   Fracture, sacrum/coccyx ([place]) 07/21/2020   History of 2019 novel coronavirus disease (COVID-19) 06/26/2019   Orthostatic hypotension 05/25/2019   Dysmenorrhea 11/29/2018   Suspicious nevus 10/31/2018   Elevated LDL cholesterol level 01/13/2018   History of lower GI bleeding 08/17/2017   Trouble in sleeping 08/17/2017   Inattention 08/17/2017   History of ITP 02/20/2017   Palpitations 03/03/2015   Depression 09/30/2014   Intractable migraine without aura and with status migrainosus 09/30/2014   Abnormal weight gain 04/23/2014   Ventricular bigeminy 05/28/2013   Past Medical History:  Diagnosis Date   Anemia    Anxiety    Blood transfusion without reported diagnosis    Clotting disorder ([place])    Colitis    Depression    GI bleed    History of ITP    Family History  Problem Relation Age of Onset   Hyperlipidemia Father    Hypertension Father    Heart attack Father    Diabetes Maternal Grandfather    Heart  attack Paternal Grandfather    Lung cancer Maternal Aunt    Cancer Maternal Uncle    Allergies  Allergen Reactions   Topamax [Topiramate]     Extreme taste changes.       Patient Care Team: Lavada Mesi as PCP - General (Family Medicine)   Outpatient Medications Prior to Visit  Medication Sig   drospirenone-ethinyl estradiol (YAZ) 3-0.02 MG tablet TAKE 1 TABLET BY MOUTH DAILY. SKIP PLACEBOS.   flecainide (TAMBOCOR) 50 MG tablet Take 1 tablet by mouth 2 times daily.   rizatriptan (MAXALT-MLT) 10 MG disintegrating tablet Take 1 tablet (10 mg total) by mouth as needed for migraine. May repeat in 2 hours if needed   Semaglutide-Weight Management 2.4 MG/0.75ML SOAJ Inject 2.4 mg into the skin once a week for 28 days.   [DISCONTINUED] atorvastatin (LIPITOR) 10 MG tablet TAKE 1 TABLET BY MOUTH ONCE DAILY   No facility-administered medications prior to visit.    Review of Systems  All other systems reviewed and are negative.         Objective:     BP 113/82   Pulse 100   Ht 5' 5.5" (1.664 m)   Wt 117 lb (53.1 kg)   SpO2 100%   BMI 19.17 kg/m  BP Readings  from Last 3 Encounters:  02/12/22 113/82  06/29/21 119/72  06/22/21 116/71   Wt Readings from Last 3 Encounters:  02/12/22 117 lb (53.1 kg)  06/09/21 141 lb (64 kg)  02/09/21 119 lb (54 kg)      Physical Exam  BP 113/82   Pulse 100   Ht 5' 5.5" (1.664 m)   Wt 117 lb (53.1 kg)   SpO2 100%   BMI 19.17 kg/m   General Appearance:    Alert, cooperative, no distress, appears stated age  Head:    Normocephalic, without obvious abnormality, atraumatic  Eyes:    PERRL, conjunctiva/corneas clear, EOM's intact, fundi    benign, both eyes  Ears:    Normal TM's and external ear canals, both ears  Nose:   Nares normal, septum midline, mucosa normal, no drainage    or sinus tenderness  Throat:   Lips, mucosa, and tongue normal; teeth and gums normal  Neck:   Supple, symmetrical, trachea midline, no  adenopathy;    thyroid:  no enlargement/tenderness/nodules; no carotid   bruit or JVD  Back:     Symmetric, no curvature, ROM normal, no CVA tenderness  Lungs:     Clear to auscultation bilaterally, respirations unlabored  Chest Wall:    No tenderness or deformity   Heart:    Regular rate and rhythm, S1 and S2 normal, no murmur, rub   or gallop     Abdomen:     Soft, non-tender, bowel sounds active all four quadrants,    no masses, no organomegaly        Extremities:   Extremities normal, atraumatic, no cyanosis or edema  Pulses:   2+ and symmetric all extremities  Skin:   Skin color, texture, turgor normal, no rashes or lesions  Lymph nodes:   Cervical, supraclavicular, and axillary nodes normal  Neurologic:   CNII-XII intact, normal strength, sensation and reflexes    throughout       Assessment & Plan:    Routine Health Maintenance and Physical Exam  Immunization History  Administered Date(s) Administered   Influenza-Unspecified 04/25/2016, 04/25/2017, 05/16/2018, 05/25/2019   PFIZER(Purple Top)SARS-COV-2 Vaccination 03/21/2020, 04/11/2020    Health Maintenance  Topic Date Due   MAMMOGRAM  Never done   TETANUS/TDAP  07/26/2021   PAP SMEAR-Modifier  02/12/2022 (Originally 01/24/2016)   COVID-19 Vaccine (3 - Pfizer risk series) 02/28/2022 (Originally 05/09/2020)   Hepatitis C Screening  06/22/2022 (Originally 08/17/1996)   INFLUENZA VACCINE  02/23/2022   HIV Screening  Completed   HPV VACCINES  Aged Out    Discussed health benefits of physical activity, and encouraged her to engage in regular exercise appropriate for her age and condition.  .. Discussed 150 minutes of exercise a week.  Encouraged vitamin D 1000 units and Calcium '1300mg'$  or 4 servings of dairy a day.  Fasting labs ordered PHQ-9 no concerns Encouraged patient to get mammogram and pap smear Colonoscopy UTD Tdap needed but we did not have in stock today Continue on wegovy for weight management Follow up  in 1 year.   Return in about 1 year (around 02/13/2023).     Iran Planas, PA-C

## 2022-02-16 ENCOUNTER — Other Ambulatory Visit: Payer: Self-pay

## 2022-02-16 ENCOUNTER — Ambulatory Visit (INDEPENDENT_AMBULATORY_CARE_PROVIDER_SITE_OTHER): Payer: No Typology Code available for payment source | Admitting: Cardiology

## 2022-02-16 ENCOUNTER — Encounter: Payer: Self-pay | Admitting: Cardiology

## 2022-02-16 VITALS — BP 94/66 | HR 88 | Ht 65.0 in | Wt 119.0 lb

## 2022-02-16 DIAGNOSIS — Z23 Encounter for immunization: Secondary | ICD-10-CM

## 2022-02-16 DIAGNOSIS — I493 Ventricular premature depolarization: Secondary | ICD-10-CM | POA: Diagnosis not present

## 2022-02-16 NOTE — Progress Notes (Signed)
Electrophysiology Office Note   Date:  02/16/2022   ID:  Jennifer Abbott, DOB [birth year], MRN [id number]  PCP:  [name], PA-C  Cardiologist:  Stanford Breed Primary Electrophysiologist:  Alissandra Geoffroy Meredith Leeds, MD    Chief Complaint: PVCs   History of Present Illness: Jennifer Abbott is a 41 y.o. female who is being seen today for the evaluation of PVCs at the request of [name], [place], PA-C. Presenting today for electrophysiology evaluation.  She has a history significant for ITP, colitis, GI bleeding, PVCs.  She wore a cardiac monitor with a 1.5% burden of PVCs and occasional PACs.  She is quite symptomatic from her PVCs.  She has since been started on flecainide.  Today, denies symptoms of palpitations, chest pain, shortness of breath, orthopnea, PND, lower extremity edema, claudication, dizziness, presyncope, syncope, bleeding, or neurologic sequela. The patient is tolerating medications without difficulties.  Starting her flecainide she has done well.  She notes no further PVCs.  She is overall quite happy with how she has been feeling.  She has noted a fullness in her chest at times but she checks her pulse which is normal.   Past Medical History:  Diagnosis Date   Anemia    Anxiety    Blood transfusion without reported diagnosis    Clotting disorder ([place])    Colitis    Depression    GI bleed    History of ITP    Past Surgical History:  Procedure Laterality Date   BREAST ENHANCEMENT SURGERY Bilateral    2003   COSMETIC SURGERY     ovarirectomy      right for cyst     Current Outpatient Medications  Medication Sig Dispense Refill   atorvastatin (LIPITOR) 10 MG tablet TAKE 1 TABLET BY MOUTH ONCE DAILY 90 tablet 3   drospirenone-ethinyl estradiol (YAZ) 3-0.02 MG tablet TAKE 1 TABLET BY MOUTH DAILY. SKIP PLACEBOS. 84 tablet 3   flecainide (TAMBOCOR) 50 MG tablet Take 1 tablet by mouth 2 times daily. 180 tablet 0   rizatriptan (MAXALT-MLT) 10 MG disintegrating tablet  Take 1 tablet (10 mg total) by mouth as needed for migraine. May repeat in 2 hours if needed 30 tablet 3   Semaglutide-Weight Management 2.4 MG/0.75ML SOAJ Inject 2.4 mg into the skin once a week for 28 days. 3 mL 4   No current facility-administered medications for this visit.    Allergies:   Topamax [topiramate]   Social History:  The patient  reports that she has quit smoking. She has never used smokeless tobacco. She reports current alcohol use.   Family History:  The patient's family history includes Cancer in her maternal uncle; Diabetes in her maternal grandfather; Heart attack in her father and paternal grandfather; Hyperlipidemia in her father; Hypertension in her father; Lung cancer in her maternal aunt.   ROS:  Please see the history of present illness.   Otherwise, review of systems is positive for none.   All other systems are reviewed and negative.   PHYSICAL EXAM: VS:  BP 94/66   Pulse 88   Ht '5\' 5"'$  (1.651 m)   Wt 119 lb (54 kg)   SpO2 99%   BMI 19.80 kg/m  , BMI Body mass index is 19.8 kg/m. GEN: Well nourished, well developed, in no acute distress  HEENT: normal  Neck: no JVD, carotid bruits, or masses Cardiac: RRR; no murmurs, rubs, or gallops,no edema  Respiratory:  clear to auscultation bilaterally, normal work of  breathing GI: soft, nontender, nondistended, + BS MS: no deformity or atrophy  Skin: warm and dry Neuro:  Strength and sensation are intact Psych: euthymic mood, full affect  EKG:  EKG is ordered today. Personal review of the ekg ordered shows sinus rhythm   Recent Labs: No results found for requested labs within last 365 days.    Lipid Panel     Component Value Date/Time   CHOL 181 10/29/2020 1402   TRIG 147 10/29/2020 1402   HDL 86 10/29/2020 1402   CHOLHDL 2.1 10/29/2020 1402   VLDL 19 04/16/2016 1025   LDLCALC 72 10/29/2020 1402     Wt Readings from Last 3 Encounters:  02/16/22 119 lb (54 kg)  02/12/22 117 lb (53.1 kg)   06/09/21 141 lb (64 kg)      Other studies Reviewed: Additional studies/ records that were reviewed today include: TTE 12/05/20  Review of the above records today demonstrates:   1. Only view with adequate endocardial definition was PS LAX, Definity  contrast was not given. Left ventricular ejection fraction, by estimation,  is 50 to 55%. The left ventricle has low normal function. Left ventricular  endocardial border not optimally   defined to evaluate regional wall motion. Left ventricular diastolic  parameters were normal.   2. Right ventricular systolic function was not well visualized. The right  ventricular size is normal. There is normal pulmonary artery systolic  pressure.   3. The mitral valve is normal in structure. No evidence of mitral valve  regurgitation. No evidence of mitral stenosis.   4. The aortic valve was not well visualized. Aortic valve regurgitation  is not visualized. No aortic stenosis is present.   Cardiac monitor 11/13/2020 personally reviewed Sinus rhythm with frequent PVCs and occasional PAC.  Burden of PVCs 1.5%, 6533.  ASSESSMENT AND PLAN:  1.  PVCs: Cardiac monitor with a burden of 1.5%.  Currently on flecainide 50 mg twice daily.  She was previously quite symptomatic from her PVCs.  High risk medication monitoring for flecainide via ECG today.  She remains in sinus rhythm with minimal PVCs.  I have told her that the fullness feeling in her chest is not a worrisome feeling as her pulse is normal when it occurs.  We Kacy Conely continue with current management.  2.  Hyperlipidemia: Continue atorvastatin 10 mg daily per primary physician.  Current medicines are reviewed at length with the patient today.   The patient does not have concerns regarding her medicines.  The following changes were made today: None  Labs/ tests ordered today include:  Orders Placed This Encounter  Procedures   EKG 12-Lead      Disposition:   FU 6 months  Signed, Cong Hightower  Meredith Leeds, MD  02/16/2022 10:03 AM     [facility] [address] [place] [address] [phone] (office) [phone] (fax)

## 2022-02-16 NOTE — Progress Notes (Unsigned)
Patient is here for a Tdap injection. Patient tolerated injection on Right Deltoid well without complications.

## 2022-02-16 NOTE — Patient Instructions (Signed)
Medication Instructions:  Your physician recommends that you continue on your current medications as directed. Please refer to the Current Medication list given to you today.  *If you need a refill on your cardiac medications before your next appointment, please call your pharmacy*   Lab Work: None ordered  Testing/Procedures: None ordered   Follow-Up: At [facility], you and your health needs are our priority.  As part of our continuing mission to provide you with exceptional heart care, we have created designated Provider Care Teams.  These Care Teams include your primary Cardiologist (physician) and Advanced Practice Providers (APPs -  Physician Assistants and Nurse Practitioners) who all work together to provide you with the care you need, when you need it.   Your next appointment:   6 month(s)  The format for your next appointment:   In Person  Provider:   You will see one of the following Advanced Practice Providers on your designated Care Team:   Renee Ursuy, PA-C Michael "Andy" Tillery, PA-C    Thank you for choosing [facility]!!   Orey Moure, RN [phone]  Other Instructions   Important Information About Sugar           

## 2022-02-17 LAB — COMPLETE METABOLIC PANEL WITH GFR
AG Ratio: 1.6 (calc) (ref 1.0–2.5)
ALT: 11 U/L (ref 6–29)
AST: 10 U/L (ref 10–30)
Albumin: 4.5 g/dL (ref 3.6–5.1)
Alkaline phosphatase (APISO): 46 U/L (ref 31–125)
BUN: 10 mg/dL (ref 7–25)
CO2: 23 mmol/L (ref 20–32)
Calcium: 9.7 mg/dL (ref 8.6–10.2)
Chloride: 103 mmol/L (ref 98–110)
Creat: 0.79 mg/dL (ref 0.50–0.99)
Globulin: 2.9 g/dL (calc) (ref 1.9–3.7)
Glucose, Bld: 92 mg/dL (ref 65–99)
Potassium: 4 mmol/L (ref 3.5–5.3)
Sodium: 138 mmol/L (ref 135–146)
Total Bilirubin: 0.4 mg/dL (ref 0.2–1.2)
Total Protein: 7.4 g/dL (ref 6.1–8.1)
eGFR: 95 mL/min/{1.73_m2} (ref >=60)

## 2022-02-17 LAB — CBC WITH DIFFERENTIAL/PLATELET
Absolute Monocytes: 651 cells/uL (ref 200–950)
Basophils Absolute: 44 cells/uL (ref 0–200)
Basophils Relative: 0.5 %
Eosinophils Absolute: 202 cells/uL (ref 15–500)
Eosinophils Relative: 2.3 %
HCT: 39.4 % (ref 35.0–45.0)
Hemoglobin: 13.2 g/dL (ref 11.7–15.5)
Lymphs Abs: 2614 cells/uL (ref 850–3900)
MCH: 31.4 pg (ref 27.0–33.0)
MCHC: 33.5 g/dL (ref 32.0–36.0)
MCV: 93.8 fL (ref 80.0–100.0)
MPV: 11.8 fL (ref 7.5–12.5)
Monocytes Relative: 7.4 %
Neutro Abs: 5289 cells/uL (ref 1500–7800)
Neutrophils Relative %: 60.1 %
Platelets: 301 10*3/uL (ref 140–400)
RBC: 4.2 10*6/uL (ref 3.80–5.10)
RDW: 11.2 % (ref 11.0–15.0)
Total Lymphocyte: 29.7 %
WBC: 8.8 10*3/uL (ref 3.8–10.8)

## 2022-02-17 LAB — TSH: TSH: 2.92 mIU/L

## 2022-02-17 LAB — LIPID PANEL W/REFLEX DIRECT LDL
Cholesterol: 193 mg/dL (ref <200)
HDL: 86 mg/dL (ref >=50)
LDL Cholesterol (Calc): 83 mg/dL (calc)
Non-HDL Cholesterol (Calc): 107 mg/dL (calc) (ref <130)
Total CHOL/HDL Ratio: 2.2 (calc) (ref <5.0)
Triglycerides: 147 mg/dL (ref <150)

## 2022-02-21 NOTE — Progress Notes (Signed)
Shavawn,   Hemoglobin and platelets look great.  Kidney, liver, glucose look wonderful.  Cholesterol looks great.  Thyroid normal range but does seem to be rising year to year. Recheck in 6 months.   Overall labs look really good.

## 2022-02-24 ENCOUNTER — Other Ambulatory Visit (HOSPITAL_BASED_OUTPATIENT_CLINIC_OR_DEPARTMENT_OTHER): Payer: Self-pay

## 2022-02-26 ENCOUNTER — Other Ambulatory Visit (HOSPITAL_BASED_OUTPATIENT_CLINIC_OR_DEPARTMENT_OTHER): Payer: Self-pay

## 2022-03-02 ENCOUNTER — Other Ambulatory Visit (HOSPITAL_BASED_OUTPATIENT_CLINIC_OR_DEPARTMENT_OTHER): Payer: Self-pay

## 2022-03-22 ENCOUNTER — Other Ambulatory Visit (HOSPITAL_BASED_OUTPATIENT_CLINIC_OR_DEPARTMENT_OTHER): Payer: Self-pay

## 2022-03-28 ENCOUNTER — Other Ambulatory Visit (HOSPITAL_BASED_OUTPATIENT_CLINIC_OR_DEPARTMENT_OTHER): Payer: Self-pay | Admitting: Nurse Practitioner

## 2022-03-28 DIAGNOSIS — R112 Nausea with vomiting, unspecified: Secondary | ICD-10-CM

## 2022-03-28 MED ORDER — PROMETHAZINE HCL 25 MG PO TABS: 25.0000 mg | ORAL_TABLET | Freq: Three times a day (TID) | ORAL | 3 refills | Status: DC | PRN

## 2022-03-28 MED ORDER — PROMETHAZINE HCL 12.5 MG PO TABS: 12.5000 mg | ORAL_TABLET | Freq: Three times a day (TID) | ORAL | 3 refills | Status: DC | PRN

## 2022-04-05 ENCOUNTER — Other Ambulatory Visit (HOSPITAL_BASED_OUTPATIENT_CLINIC_OR_DEPARTMENT_OTHER): Payer: Self-pay | Admitting: Nurse Practitioner

## 2022-04-05 ENCOUNTER — Other Ambulatory Visit (HOSPITAL_BASED_OUTPATIENT_CLINIC_OR_DEPARTMENT_OTHER): Payer: Self-pay

## 2022-04-05 DIAGNOSIS — E7800 Pure hypercholesterolemia, unspecified: Secondary | ICD-10-CM

## 2022-04-05 DIAGNOSIS — R635 Abnormal weight gain: Secondary | ICD-10-CM

## 2022-04-05 MED ORDER — SEMAGLUTIDE-WEIGHT MANAGEMENT 2.4 MG/0.75ML ~~LOC~~ SOAJ
2.4000 mg | SUBCUTANEOUS | 11 refills | Status: DC
  Filled 2022-04-05: qty 3, 28d supply, fill #0
  Filled 2022-05-19: qty 3, 28d supply, fill #1
  Filled 2022-06-24: qty 3, 28d supply, fill #2
  Filled 2022-07-21: qty 3, 28d supply, fill #3
  Filled 2022-09-22: qty 3, 28d supply, fill #4

## 2022-04-23 ENCOUNTER — Other Ambulatory Visit (HOSPITAL_BASED_OUTPATIENT_CLINIC_OR_DEPARTMENT_OTHER): Payer: Self-pay

## 2022-04-23 ENCOUNTER — Other Ambulatory Visit: Payer: Self-pay | Admitting: Cardiology

## 2022-04-23 MED ORDER — FLECAINIDE ACETATE 50 MG PO TABS
50.0000 mg | ORAL_TABLET | Freq: Two times a day (BID) | ORAL | 3 refills | Status: DC
  Filled 2022-04-23: qty 180, 90d supply, fill #0
  Filled 2022-07-24: qty 180, 90d supply, fill #1
  Filled 2022-10-24: qty 180, 90d supply, fill #2
  Filled 2023-01-24: qty 180, 90d supply, fill #3

## 2022-04-28 ENCOUNTER — Encounter: Payer: Self-pay | Admitting: Physician Assistant

## 2022-04-28 ENCOUNTER — Ambulatory Visit (INDEPENDENT_AMBULATORY_CARE_PROVIDER_SITE_OTHER): Payer: No Typology Code available for payment source | Admitting: Physician Assistant

## 2022-04-28 VITALS — BP 110/77 | Ht 65.0 in | Wt 119.0 lb

## 2022-04-28 DIAGNOSIS — G43511 Persistent migraine aura without cerebral infarction, intractable, with status migrainosus: Secondary | ICD-10-CM

## 2022-04-28 HISTORY — DX: Persistent migraine aura without cerebral infarction, intractable, with status migrainosus: G43.511

## 2022-04-28 LAB — POC COVID19 BINAXNOW: SARS Coronavirus 2 Ag: NEGATIVE

## 2022-04-28 MED ORDER — PROMETHAZINE HCL 25 MG/ML IJ SOLN
25.0000 mg | Freq: Once | INTRAMUSCULAR | Status: AC
  Administered 2022-04-28: 25 mg via INTRAMUSCULAR

## 2022-04-28 MED ORDER — KETOROLAC TROMETHAMINE 60 MG/2ML IM SOLN
60.0000 mg | Freq: Once | INTRAMUSCULAR | Status: AC
  Administered 2022-04-28: 60 mg via INTRAMUSCULAR

## 2022-04-28 MED ORDER — DEXAMETHASONE SODIUM PHOSPHATE 10 MG/ML IJ SOLN
10.0000 mg | Freq: Once | INTRAMUSCULAR | Status: AC
  Administered 2022-04-28: 10 mg via INTRAMUSCULAR

## 2022-04-28 NOTE — Progress Notes (Signed)
   Acute Office Visit  Subjective:     Patient ID: Jennifer Abbott, female    DOB: June 25, 1981, 41 y.o.   MRN: [id number]  Chief Complaint  Patient presents with   Migraine    Migraine    Pt is a 41 yo F with PMH of intermittent migraine presenting with CC of left sided migraine onset 3 days ago without relief. She has tried taking maxalt, tylenol, ibuprofen. She c/o associated nausea and vomited once, but denies light sensitivity and vision changes. She has been having about 1 migraine a month but this one has been the longest. Has not tested for covid. No other URI symptoms.    ROS See HPI.     Objective:    BP 110/77   Ht '5\' 5"'$  (1.651 m)   Wt 54 kg   BMI 19.80 kg/m  BP Readings from Last 3 Encounters:  04/28/22 110/77  02/16/22 94/66  02/12/22 113/82   Wt Readings from Last 3 Encounters:  04/28/22 54 kg  02/16/22 54 kg  02/12/22 53.1 kg   SpO2 Readings from Last 3 Encounters:  02/16/22 99%  02/12/22 100%  06/29/21 98%      Physical Exam Constitutional:      Comments: Appears uncomfortable due to pain  HENT:     Head: Normocephalic and atraumatic.  Cardiovascular:     Rate and Rhythm: Normal rate and regular rhythm.  Pulmonary:     Effort: Pulmonary effort is normal.  Skin:    General: Skin is warm and dry.  Neurological:     Mental Status: She is alert and oriented to person, place, and time.  Psychiatric:        Mood and Affect: Mood normal.     .. Results for orders placed or performed in visit on 04/28/22  POC COVID-19  Result Value Ref Range   SARS Coronavirus 2 Ag Negative Negative       Assessment & Plan:   Marland KitchenMarland KitchenAmber was seen today for migraine.  Diagnoses and all orders for this visit:  Migraine with status migrainosus, not intractable, unspecified migraine type -     POC COVID-19 -     ketorolac (TORADOL) injection 60 mg -     dexamethasone (DECADRON) injection 10 mg -     promethazine (PHENERGAN) injection 25 mg   COVID  negative Migraine cocktail given today for 72 hr migraine  Work note given  Will consider preventative migraine treatment in future Gave samples of nurtec to try if maxalt is not aborting migraine Ok for [employer] for migraines for 2-3 days a month. Pt will submit paperwork.

## 2022-04-28 NOTE — Patient Instructions (Signed)
Nurtec trial. Consider preventative if migraines increasing.

## 2022-05-11 ENCOUNTER — Other Ambulatory Visit (HOSPITAL_BASED_OUTPATIENT_CLINIC_OR_DEPARTMENT_OTHER): Payer: Self-pay

## 2022-05-19 ENCOUNTER — Other Ambulatory Visit (HOSPITAL_BASED_OUTPATIENT_CLINIC_OR_DEPARTMENT_OTHER): Payer: Self-pay

## 2022-05-31 ENCOUNTER — Other Ambulatory Visit: Payer: Self-pay | Admitting: Physician Assistant

## 2022-05-31 ENCOUNTER — Other Ambulatory Visit (HOSPITAL_BASED_OUTPATIENT_CLINIC_OR_DEPARTMENT_OTHER): Payer: Self-pay

## 2022-05-31 DIAGNOSIS — B351 Tinea unguium: Secondary | ICD-10-CM

## 2022-05-31 MED ORDER — TERBINAFINE HCL 250 MG PO TABS
250.0000 mg | ORAL_TABLET | Freq: Every day | ORAL | 1 refills | Status: AC
  Filled 2022-05-31: qty 90, 90d supply, fill #0
  Filled 2022-08-19: qty 90, 90d supply, fill #1

## 2022-06-21 ENCOUNTER — Other Ambulatory Visit (HOSPITAL_BASED_OUTPATIENT_CLINIC_OR_DEPARTMENT_OTHER): Payer: Self-pay

## 2022-06-24 ENCOUNTER — Other Ambulatory Visit (HOSPITAL_BASED_OUTPATIENT_CLINIC_OR_DEPARTMENT_OTHER): Payer: Self-pay

## 2022-07-05 ENCOUNTER — Encounter: Payer: Self-pay | Admitting: Oncology

## 2022-07-10 ENCOUNTER — Encounter: Payer: Self-pay | Admitting: Oncology

## 2022-07-14 IMAGING — DX DG LUMBAR SPINE COMPLETE 4+V
5 series · 5 of 5 positions shown · non-contrast
Comparison: None.

CLINICAL DATA: sudden low back pain severe started [DATE]

EXAM:
LUMBAR SPINE - COMPLETE 4+ VIEW

[l-spine ap]
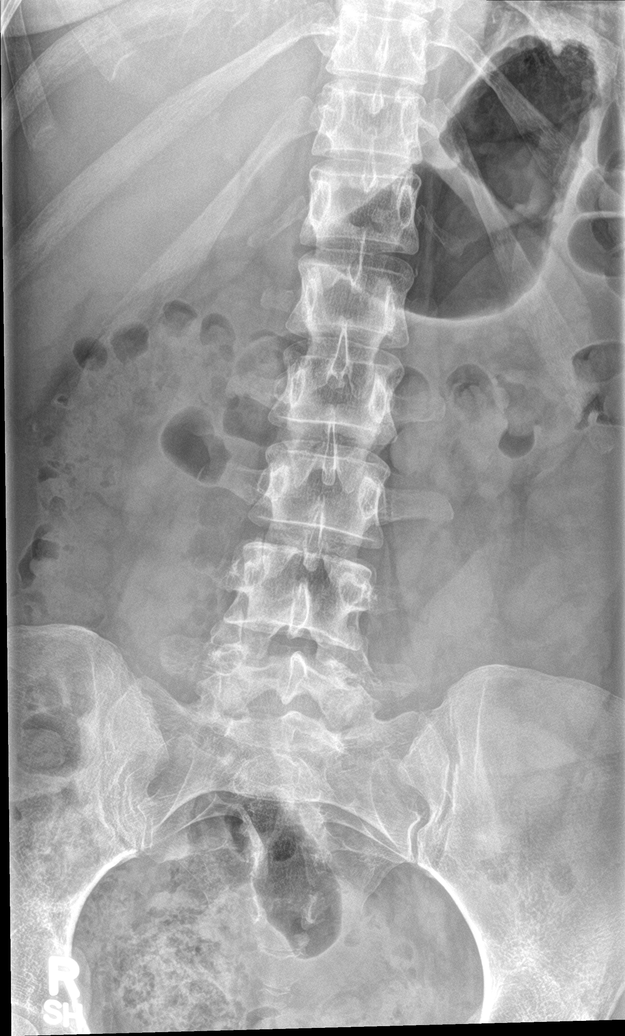

[l-spine obl (1 of 2)]
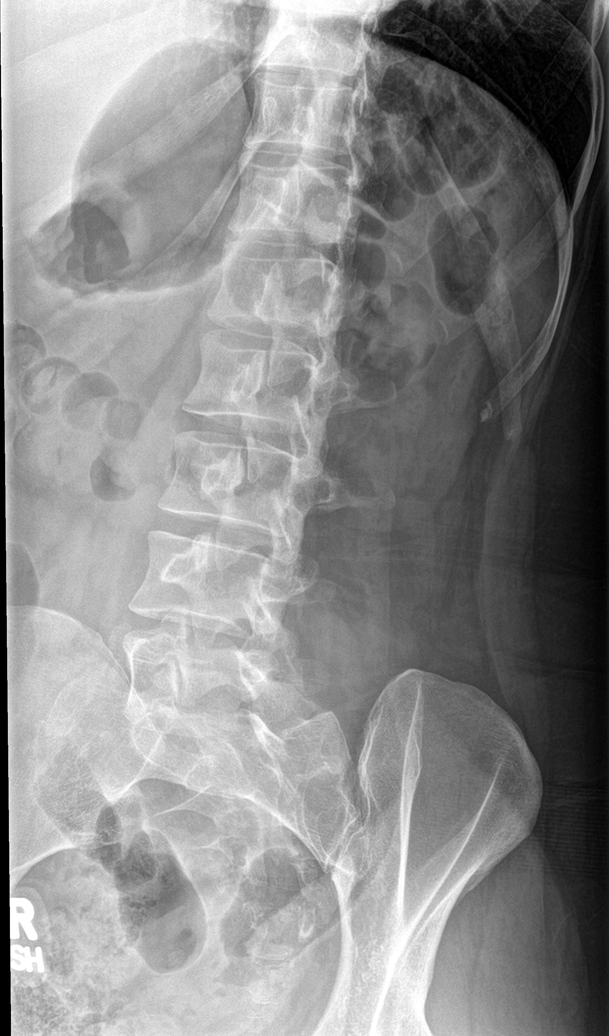

[l-spine obl (2 of 2)]
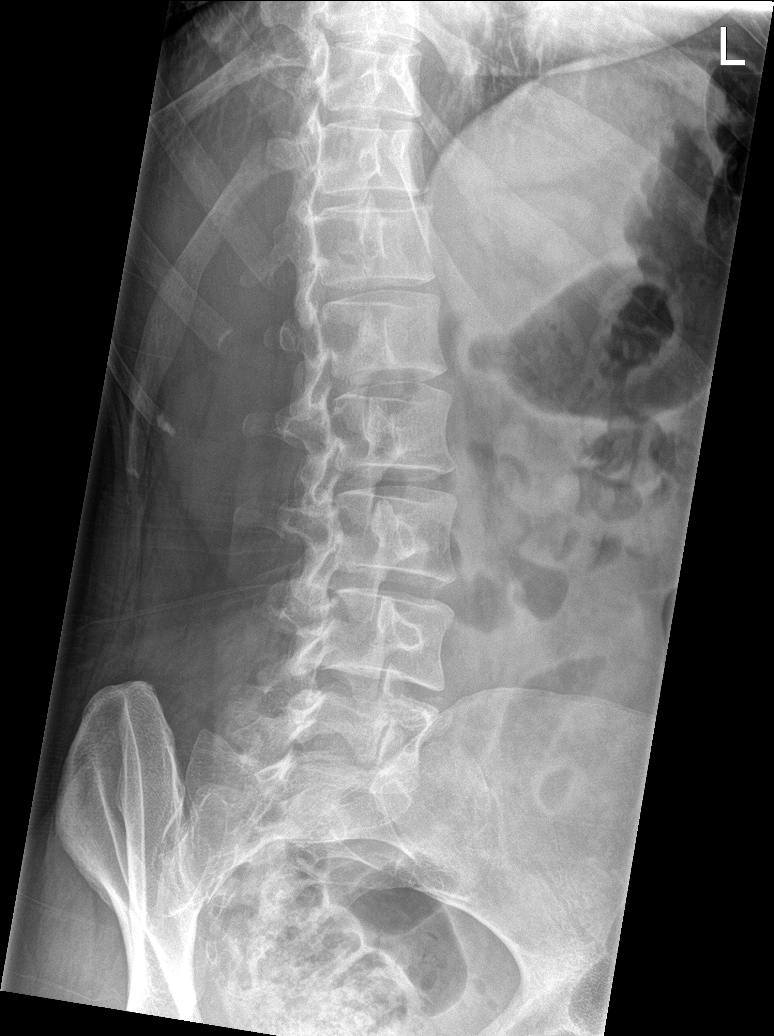

[l-spine lat]
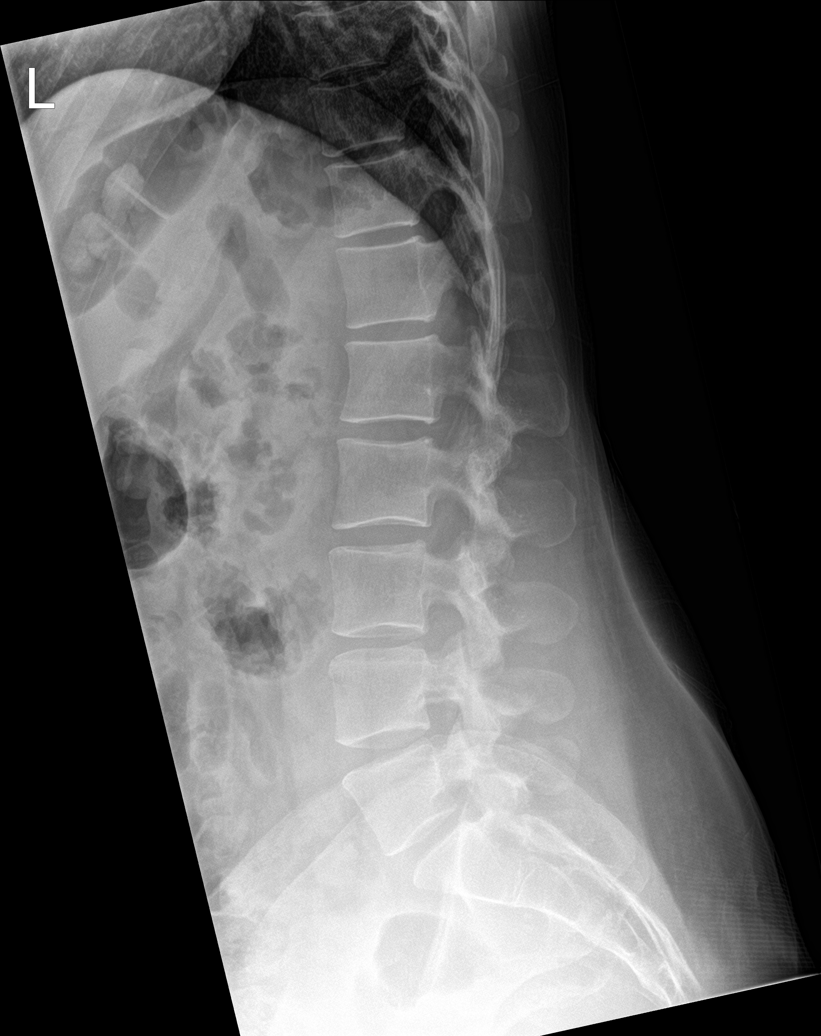

[l-spine spot]
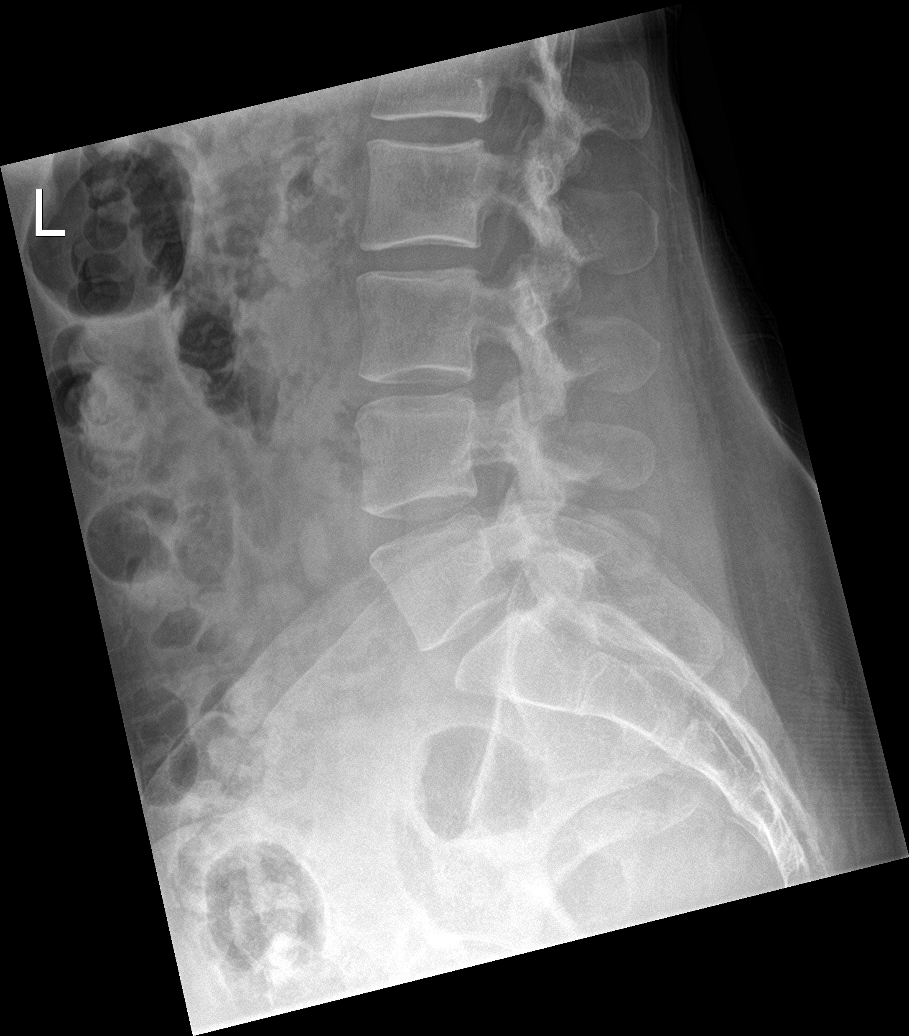

[5 of 5 positions shown; findings below may reference images not displayed]

FINDINGS: Markedly limited evaluation due to overlapping osseous structures
and overlying soft tissues. Five non-rib-bearing lumbar vertebral
bodies. There is no evidence of lumbar spine fracture. Alignment is
normal. Intervertebral disc spaces are maintained.
IMPRESSION: Negative.

## 2022-07-21 ENCOUNTER — Other Ambulatory Visit (HOSPITAL_BASED_OUTPATIENT_CLINIC_OR_DEPARTMENT_OTHER): Payer: Self-pay

## 2022-07-22 ENCOUNTER — Other Ambulatory Visit (HOSPITAL_BASED_OUTPATIENT_CLINIC_OR_DEPARTMENT_OTHER): Payer: Self-pay

## 2022-07-22 ENCOUNTER — Other Ambulatory Visit: Payer: Self-pay

## 2022-07-23 ENCOUNTER — Other Ambulatory Visit (HOSPITAL_BASED_OUTPATIENT_CLINIC_OR_DEPARTMENT_OTHER): Payer: Self-pay

## 2022-07-24 ENCOUNTER — Other Ambulatory Visit: Payer: Self-pay

## 2022-08-19 ENCOUNTER — Other Ambulatory Visit: Payer: Self-pay | Admitting: Physician Assistant

## 2022-08-20 ENCOUNTER — Other Ambulatory Visit (HOSPITAL_BASED_OUTPATIENT_CLINIC_OR_DEPARTMENT_OTHER): Payer: Self-pay

## 2022-08-20 ENCOUNTER — Encounter: Payer: Self-pay | Admitting: Oncology

## 2022-08-20 ENCOUNTER — Other Ambulatory Visit: Payer: Self-pay

## 2022-08-20 MED ORDER — RIZATRIPTAN BENZOATE 10 MG PO TBDP
10.0000 mg | ORAL_TABLET | ORAL | 3 refills | Status: DC | PRN
  Filled 2022-08-20: qty 30, 90d supply, fill #0

## 2022-09-22 ENCOUNTER — Other Ambulatory Visit (HOSPITAL_BASED_OUTPATIENT_CLINIC_OR_DEPARTMENT_OTHER): Payer: Self-pay

## 2022-09-23 ENCOUNTER — Encounter: Payer: Self-pay | Admitting: Oncology

## 2022-09-23 ENCOUNTER — Other Ambulatory Visit (HOSPITAL_BASED_OUTPATIENT_CLINIC_OR_DEPARTMENT_OTHER): Payer: Self-pay

## 2022-09-27 ENCOUNTER — Other Ambulatory Visit (HOSPITAL_BASED_OUTPATIENT_CLINIC_OR_DEPARTMENT_OTHER): Payer: Self-pay

## 2022-09-28 ENCOUNTER — Other Ambulatory Visit (HOSPITAL_BASED_OUTPATIENT_CLINIC_OR_DEPARTMENT_OTHER): Payer: Self-pay

## 2022-09-28 ENCOUNTER — Encounter: Payer: Self-pay | Admitting: Oncology

## 2022-09-28 NOTE — Progress Notes (Unsigned)
  Cardiology Office Note:   Date:  09/29/2022  ID:  Jennifer Abbott, DOB [birth year], MRN [id number]  Primary Cardiologist: None Electrophysiologist: Will Meredith Leeds, MD   History of Present Illness:   Jennifer Abbott is a 42 y.o. female with h/o HLD and PVCs managed on flecainide seen today for routine electrophysiology followup. Since last being seen in our clinic the patient reports doing well overall. She has occasional palpitations, but no clear aggravating/relieving factors. She occasionally has lightheadedness with rapid standing, but it is not significant or limiting. She also reports increased cardiac awareness and visible "pounding in her chest" at times.   Review of systems complete and found to be negative unless listed in HPI.    Studies Reviewed:    EKG is ordered today. Personal review shows NSR at 91 bpm, no PVCs  Cardiac monitor 11/13/2020 personally reviewed Sinus rhythm with frequent PVCs and occasional PAC.  Burden of PVCs 1.5%, 6533.  Risk Assessment/Calculations:             Physical Exam:   VS:  BP 98/64   Pulse 91   Ht '5\' 5"'$  (1.651 m)   Wt 114 lb 12.8 oz (52.1 kg)   SpO2 96%   BMI 19.10 kg/m    Wt Readings from Last 3 Encounters:  09/29/22 114 lb 12.8 oz (52.1 kg)  04/28/22 119 lb (54 kg)  02/16/22 119 lb (54 kg)     GEN: Well nourished, well developed in no acute distress NECK: No JVD; No carotid bruits CARDIAC: RRR, no murmurs, rubs, gallops RESPIRATORY:  Clear to auscultation without rales, wheezing or rhonchi  ABDOMEN: Soft, non-tender, non-distended EXTREMITIES:  No edema; No deformity   ASSESSMENT AND PLAN:   PVCs Palpitations EKG today shows NSR without PVCs Prior monitor with burden ~1.5% Continue flecainide 50 mg BID Intermittent palpitations sound like ectopy. Could consider low dose CCB/BB in the future if needed.   HLD Continue atorvastatin 10 mg daily per PCP  Lightheadedness Intermittent with no clear aggravating or relieving  factors.  She should try compression wear and make sure she is adequately hydrated.  Follow up with Dr. Curt Bears in 6 months. Sooner with issues.   Signed, Shirley Friar, PA-C

## 2022-09-29 ENCOUNTER — Encounter: Payer: Self-pay | Admitting: Student

## 2022-09-29 ENCOUNTER — Ambulatory Visit: Payer: 59 | Attending: Student | Admitting: Student

## 2022-09-29 VITALS — BP 98/64 | HR 91 | Ht 65.0 in | Wt 114.8 lb

## 2022-09-29 DIAGNOSIS — E7800 Pure hypercholesterolemia, unspecified: Secondary | ICD-10-CM | POA: Diagnosis not present

## 2022-09-29 DIAGNOSIS — I493 Ventricular premature depolarization: Secondary | ICD-10-CM

## 2022-09-29 NOTE — Patient Instructions (Signed)
Medication Instructions:  Your physician recommends that you continue on your current medications as directed. Please refer to the Current Medication list given to you today.  *If you need a refill on your cardiac medications before your next appointment, please call your pharmacy*   Lab Work: None If you have labs (blood work) drawn today and your tests are completely normal, you will receive your results only by: [place] (if you have MyChart) OR A paper copy in the mail If you have any lab test that is abnormal or we need to change your treatment, we will call you to review the results.   Follow-Up: At [facility], you and your health needs are our priority.  As part of our continuing mission to provide you with exceptional heart care, we have created designated Provider Care Teams.  These Care Teams include your primary Cardiologist (physician) and Advanced Practice Providers (APPs -  Physician Assistants and Nurse Practitioners) who all work together to provide you with the care you need, when you need it.  Your next appointment:   6 month(s)  Provider:   Allegra Lai, MD

## 2022-09-30 ENCOUNTER — Encounter: Payer: Self-pay | Admitting: Oncology

## 2022-09-30 ENCOUNTER — Other Ambulatory Visit (HOSPITAL_BASED_OUTPATIENT_CLINIC_OR_DEPARTMENT_OTHER): Payer: Self-pay

## 2022-10-04 ENCOUNTER — Other Ambulatory Visit (HOSPITAL_BASED_OUTPATIENT_CLINIC_OR_DEPARTMENT_OTHER): Payer: Self-pay

## 2022-10-04 ENCOUNTER — Other Ambulatory Visit: Payer: Self-pay | Admitting: Neurology

## 2022-10-04 MED ORDER — DROSPIRENONE-ETHINYL ESTRADIOL 3-0.02 MG PO TABS
1.0000 | ORAL_TABLET | Freq: Every day | ORAL | 3 refills | Status: DC
  Filled 2022-10-04: qty 84, 72d supply, fill #0
  Filled 2022-12-10: qty 84, 84d supply, fill #1

## 2022-10-11 ENCOUNTER — Telehealth: Payer: Self-pay | Admitting: Neurology

## 2022-10-11 NOTE — Telephone Encounter (Signed)
Patient needs ESA letter for apartment. Letter written, patient made aware.

## 2022-10-12 ENCOUNTER — Other Ambulatory Visit (HOSPITAL_BASED_OUTPATIENT_CLINIC_OR_DEPARTMENT_OTHER): Payer: Self-pay

## 2022-10-26 ENCOUNTER — Telehealth: Payer: Self-pay | Admitting: Family Medicine

## 2022-10-26 NOTE — Telephone Encounter (Signed)
[facility] not covered,  Prior auth not available.

## 2022-11-30 ENCOUNTER — Encounter: Payer: Self-pay | Admitting: Oncology

## 2022-12-10 ENCOUNTER — Ambulatory Visit: Payer: 59 | Admitting: Podiatry

## 2022-12-10 ENCOUNTER — Encounter: Payer: Self-pay | Admitting: Podiatry

## 2022-12-10 ENCOUNTER — Other Ambulatory Visit (HOSPITAL_BASED_OUTPATIENT_CLINIC_OR_DEPARTMENT_OTHER): Payer: Self-pay

## 2022-12-10 DIAGNOSIS — B351 Tinea unguium: Secondary | ICD-10-CM

## 2022-12-10 NOTE — Progress Notes (Signed)
  Subjective:  Patient ID: Jennifer Abbott, female    DOB: [birth year],   MRN: [id number]  Chief Complaint  Patient presents with   Nail Problem    Possible nail  fungus     42 y.o. female presents for concern of nail fungus that she has been dealing with for a while. She just finished a course of lamisil for 6 months. Has also been on penlac. Here today to see if she needs to continue more of the lamisil. Also relates an odd growth on her left pinky toe.   . Denies any other pedal complaints. Denies n/v/f/c.   Past Medical History:  Diagnosis Date   Anemia    Anxiety    Blood transfusion without reported diagnosis    Clotting disorder (HCC)    Colitis    Depression    GI bleed    History of ITP     Objective:  Physical Exam: Vascular: DP/PT pulses 2/4 bilateral. CFT <3 seconds. Normal hair growth on digits. No edema.  Skin. No lacerations or abrasions bilateral feet. Left hallux nail distal half is thickened and discolored with subungual debris. Proximal border normal in appearance. Some thickening noted to lateral aspect of fifth digit nail Musculoskeletal: MMT 5/5 bilateral lower extremities in DF, PF, Inversion and Eversion. Deceased ROM in DF of ankle joint.  Neurological: Sensation intact to light touch.   Assessment:   1. Onychomycosis      Plan:  Patient was evaluated and treated and all questions answered. -Examined patient -Discussed treatment options for painful dystrophic nails  -At this time it appears the nail is improving and has been on adequate amount of lamisil.  -I believe at this time holding off on another round and monitoring the nail. Discussed as long as new nail appears clear we don't need any more medication.  Discussed if she does notice worsening to call and we can get labs and restart lamisil.  -Return as needed.    Louann Sjogren, DPM

## 2022-12-13 ENCOUNTER — Other Ambulatory Visit (HOSPITAL_BASED_OUTPATIENT_CLINIC_OR_DEPARTMENT_OTHER): Payer: Self-pay

## 2022-12-14 ENCOUNTER — Other Ambulatory Visit: Payer: Self-pay | Admitting: Physician Assistant

## 2022-12-14 MED ORDER — RIZATRIPTAN BENZOATE 10 MG PO TBDP: 10.0000 mg | ORAL_TABLET | ORAL | 3 refills | Status: DC | PRN

## 2023-01-10 ENCOUNTER — Other Ambulatory Visit (HOSPITAL_BASED_OUTPATIENT_CLINIC_OR_DEPARTMENT_OTHER): Payer: Self-pay

## 2023-01-10 ENCOUNTER — Encounter: Payer: Self-pay | Admitting: Physician Assistant

## 2023-01-10 MED ORDER — TRETINOIN 0.1 % EX CREA
TOPICAL_CREAM | Freq: Every day | CUTANEOUS | 3 refills | Status: AC
  Filled 2023-01-10: qty 45, 30d supply, fill #0
  Filled 2023-04-19: qty 45, 30d supply, fill #1
  Filled 2023-06-22: qty 45, 30d supply, fill #2
  Filled 2023-07-24: qty 45, 30d supply, fill #3

## 2023-02-16 ENCOUNTER — Encounter: Payer: Self-pay | Admitting: Physician Assistant

## 2023-02-16 ENCOUNTER — Ambulatory Visit (INDEPENDENT_AMBULATORY_CARE_PROVIDER_SITE_OTHER): Payer: 59 | Admitting: Physician Assistant

## 2023-02-16 ENCOUNTER — Other Ambulatory Visit (HOSPITAL_BASED_OUTPATIENT_CLINIC_OR_DEPARTMENT_OTHER): Payer: Self-pay

## 2023-02-16 VITALS — BP 101/68 | HR 74 | Ht 65.0 in | Wt 126.0 lb

## 2023-02-16 DIAGNOSIS — Z131 Encounter for screening for diabetes mellitus: Secondary | ICD-10-CM

## 2023-02-16 DIAGNOSIS — Z0000 Encounter for general adult medical examination without abnormal findings: Secondary | ICD-10-CM | POA: Diagnosis not present

## 2023-02-16 DIAGNOSIS — E7800 Pure hypercholesterolemia, unspecified: Secondary | ICD-10-CM | POA: Diagnosis not present

## 2023-02-16 DIAGNOSIS — Z13220 Encounter for screening for lipoid disorders: Secondary | ICD-10-CM | POA: Diagnosis not present

## 2023-02-16 DIAGNOSIS — G43511 Persistent migraine aura without cerebral infarction, intractable, with status migrainosus: Secondary | ICD-10-CM

## 2023-02-16 MED ORDER — ATORVASTATIN CALCIUM 10 MG PO TABS
10.0000 mg | ORAL_TABLET | Freq: Every day | ORAL | 3 refills | Status: DC
Start: 2023-02-16 — End: 2024-03-28
  Filled 2023-02-16: qty 90, fill #0
  Filled 2023-03-27: qty 90, 90d supply, fill #0
  Filled 2023-06-22: qty 90, 90d supply, fill #1
  Filled 2023-12-03: qty 90, 90d supply, fill #2

## 2023-02-16 NOTE — Progress Notes (Signed)
Complete physical exam  Patient: Jennifer Abbott   DOB: 10/19/1980   42 y.o. Female  MRN: [id number]  Subjective:    Chief Complaint  Patient presents with   Annual Exam    Pt is here today for her physical. Denies any concerns for today's visit.    Jennifer Abbott is a 42 y.o. female who presents today for a complete physical exam. She reports consuming a general diet.  She does try to walk regularly.   She generally feels well. She reports sleeping well. She does not have additional problems to discuss today.    Most recent fall risk assessment:    02/16/2023    7:13 AM  Fall Risk   Falls in the past year? 0  Number falls in past yr: 0  Injury with Fall? 0  Risk for fall due to : No Fall Risks  Follow up Falls evaluation completed     Most recent depression screenings:    02/16/2023    7:13 AM 02/03/2021   11:19 AM  PHQ 2/9 Scores  PHQ - 2 Score 0 0  PHQ- 9 Score  2    Vision:Within last year and Dental: No current dental problems and Receives regular dental care  Patient Active Problem List   Diagnosis Date Noted   Migraine aura, persistent, intractable, with status migrainosus 04/28/2022   Paronychia of great toe of left foot 12/09/2020   Fracture, sacrum/coccyx (HCC) 07/21/2020   History of 2019 novel coronavirus disease (COVID-19) 06/26/2019   Orthostatic hypotension 05/25/2019   Dysmenorrhea 11/29/2018   Suspicious nevus 10/31/2018   Elevated LDL cholesterol level 01/13/2018   History of lower GI bleeding 08/17/2017   Trouble in sleeping 08/17/2017   Inattention 08/17/2017   History of ITP 02/20/2017   Palpitations 03/03/2015   Depression 09/30/2014   Intractable migraine without aura and with status migrainosus 09/30/2014   Abnormal weight gain 04/23/2014   Ventricular bigeminy 05/28/2013   Past Medical History:  Diagnosis Date   Anemia    Anxiety    Blood transfusion without reported diagnosis    Clotting disorder (HCC)    Colitis    Depression     GI bleed    History of ITP    Past Surgical History:  Procedure Laterality Date   BREAST ENHANCEMENT SURGERY Bilateral    2003   COSMETIC SURGERY     ovarirectomy      right for cyst   Family History  Problem Relation Age of Onset   Hyperlipidemia Father    Hypertension Father    Heart attack Father    Diabetes Maternal Grandfather    Heart attack Paternal Grandfather    Lung cancer Maternal Aunt    Cancer Maternal Uncle    Allergies  Allergen Reactions   Topamax [Topiramate]     Extreme taste changes.       Patient Care Team: Nolene Ebbs as PCP - General (Family Medicine) Regan Lemming, MD as PCP - Electrophysiology (Cardiology)   Outpatient Medications Prior to Visit  Medication Sig   flecainide (TAMBOCOR) 50 MG tablet Take 1 tablet by mouth 2 times daily.   rizatriptan (MAXALT-MLT) 10 MG disintegrating tablet Take 1 tablet (10 mg total) by mouth as needed for migraine. May repeat in 2 hours if needed.   tretinoin (RETIN-A) 0.1 % cream Apply topically at bedtime.   [DISCONTINUED] atorvastatin (LIPITOR) 10 MG tablet TAKE 1 TABLET BY MOUTH ONCE DAILY   [DISCONTINUED]  drospirenone-ethinyl estradiol (YAZ) 3-0.02 MG tablet Take 1 tablet by mouth daily. Skip placebos.   No facility-administered medications prior to visit.    Review of Systems  All other systems reviewed and are negative.         Objective:     BP 101/68 (BP Location: Left Arm, Patient Position: Sitting, Cuff Size: Normal)   Pulse 74   Ht 5\' 5"  (1.651 m)   Wt 126 lb (57.2 kg)   SpO2 100%   BMI 20.97 kg/m  BP Readings from Last 3 Encounters:  02/16/23 101/68  09/29/22 98/64  04/28/22 110/77   Wt Readings from Last 3 Encounters:  02/16/23 126 lb (57.2 kg)  09/29/22 114 lb 12.8 oz (52.1 kg)  04/28/22 119 lb (54 kg)      Physical Exam  BP 101/68 (BP Location: Left Arm, Patient Position: Sitting, Cuff Size: Normal)   Pulse 74   Ht 5\' 5"  (1.651 m)   Wt 126 lb  (57.2 kg)   SpO2 100%   BMI 20.97 kg/m   General Appearance:    Alert, cooperative, no distress, appears stated age  Head:    Normocephalic, without obvious abnormality, atraumatic  Eyes:    PERRL, conjunctiva/corneas clear, EOM's intact, fundi    benign, both eyes  Ears:    Normal TM's and external ear canals, both ears  Nose:   Nares normal, septum midline, mucosa normal, no drainage    or sinus tenderness  Throat:   Lips, mucosa, and tongue normal; teeth and gums normal  Neck:   Supple, symmetrical, trachea midline, no adenopathy;    thyroid:  no enlargement/tenderness/nodules; no carotid   bruit or JVD  Back:     Symmetric, no curvature, ROM normal, no CVA tenderness  Lungs:     Clear to auscultation bilaterally, respirations unlabored  Chest Wall:    No tenderness or deformity   Heart:    Regular rate and rhythm, S1 and S2 normal, no murmur, rub   or gallop     Abdomen:     Soft, non-tender, bowel sounds active all four quadrants,    no masses, no organomegaly        Extremities:   Extremities normal, atraumatic, no cyanosis or edema  Pulses:   2+ and symmetric all extremities  Skin:   Skin color, texture, turgor normal, no rashes or lesions  Lymph nodes:   Cervical, supraclavicular, and axillary nodes normal  Neurologic:   CNII-XII intact, normal strength, sensation and reflexes    throughout      Assessment & Plan:    Routine Health Maintenance and Physical Exam  Immunization History  Administered Date(s) Administered   Influenza,inj,Quad PF,6+ Mos 05/21/2022   Influenza-Unspecified 04/25/2016, 04/25/2017, 05/16/2018, 05/25/2019   PFIZER(Purple Top)SARS-COV-2 Vaccination 03/21/2020, 04/11/2020   Tdap 02/16/2022    Health Maintenance  Topic Date Due   PAP SMEAR-Modifier  02/16/2023 (Originally 01/24/2016)   COVID-19 Vaccine (3 - Pfizer risk series) 03/04/2023 (Originally 05/09/2020)   MAMMOGRAM  02/16/2024 (Originally 08/17/1996)   Hepatitis C Screening  02/16/2024  (Originally 08/17/1996)   INFLUENZA VACCINE  02/24/2023   DTaP/Tdap/Td (2 - Td or Tdap) 02/17/2032   HIV Screening  Completed   HPV VACCINES  Aged Out    Discussed health benefits of physical activity, and encouraged her to engage in regular exercise appropriate for her age and condition.  Marland KitchenJoice Lofts was seen today for annual exam.  Diagnoses and all orders for this visit:  Routine physical examination -  Lipid panel -     CMP14+EGFR  Screening for lipid disorders -     Lipid panel -     atorvastatin (LIPITOR) 10 MG tablet; Take 1 tablet (10 mg total) by mouth daily.  Screening for diabetes mellitus -     CMP14+EGFR  Elevated LDL cholesterol level -     atorvastatin (LIPITOR) 10 MG tablet; Take 1 tablet (10 mg total) by mouth daily.  Migraine aura, persistent, intractable, with status migrainosus   .Marland Kitchen Discussed 150 minutes of exercise a week.  Encouraged vitamin D 1000 units and Calcium 1300mg  or 4 servings of dairy a day.  PHq no concerns Vitals look great Fasting labs ordered Declined mammogram and pap, aware of risk.  Migraines controlled with as needed rescue, maxalt.  Follow up in 1 year.   Return in about 1 year (around 02/16/2024).     Tandy Gaw, PA-C

## 2023-02-16 NOTE — Patient Instructions (Signed)

## 2023-02-17 LAB — CMP14+EGFR
ALT: 32 IU/L (ref 0–32)
AST: 22 IU/L (ref 0–40)
BUN/Creatinine Ratio: 23 (ref 9–23)
BUN: 16 mg/dL (ref 6–24)
CO2: 26 mmol/L (ref 20–29)
Creatinine, Ser: 0.71 mg/dL (ref 0.57–1.00)
Globulin, Total: 2.4 g/dL (ref 1.5–4.5)
Glucose: 92 mg/dL (ref 70–99)
Potassium: 3.9 mmol/L (ref 3.5–5.2)
Sodium: 141 mmol/L (ref 134–144)
Total Protein: 7.3 g/dL (ref 6.0–8.5)
eGFR: 107 mL/min/{1.73_m2} (ref >59)

## 2023-02-17 LAB — LIPID PANEL
Chol/HDL Ratio: 1.8 ratio (ref 0.0–4.4)
Cholesterol, Total: 192 mg/dL (ref 100–199)
HDL: 104 mg/dL (ref >39)
LDL Chol Calc (NIH): 76 mg/dL (ref 0–99)
Triglycerides: 68 mg/dL (ref 0–149)
VLDL Cholesterol Cal: 12 mg/dL (ref 5–40)

## 2023-02-18 ENCOUNTER — Encounter: Payer: Self-pay | Admitting: Physician Assistant

## 2023-02-18 NOTE — Progress Notes (Signed)
Jennifer Abbott,   Cholesterol looks GREAT.  Kidney and liver look wonderful.  Your calcium is just a little elevated. Are you taking extra calcium? Make sure taking vitamin D daily as it helps you get calcium into bone. Consider recheck in a month to make sure not trending up. We could also check with parathyroid hormone.

## 2023-03-21 ENCOUNTER — Encounter: Payer: Self-pay | Admitting: Physician Assistant

## 2023-03-24 LAB — PTH, INTACT AND CALCIUM
Calcium: 9.6 mg/dL (ref 8.7–10.2)
PTH: 21 pg/mL (ref 15–65)

## 2023-03-25 NOTE — Progress Notes (Signed)
PTH and calcium look good

## 2023-03-28 ENCOUNTER — Other Ambulatory Visit (HOSPITAL_BASED_OUTPATIENT_CLINIC_OR_DEPARTMENT_OTHER): Payer: Self-pay

## 2023-03-29 ENCOUNTER — Ambulatory Visit: Payer: 59 | Admitting: Cardiology

## 2023-04-19 ENCOUNTER — Other Ambulatory Visit (HOSPITAL_BASED_OUTPATIENT_CLINIC_OR_DEPARTMENT_OTHER): Payer: Self-pay

## 2023-04-19 ENCOUNTER — Other Ambulatory Visit: Payer: Self-pay

## 2023-04-19 ENCOUNTER — Encounter: Payer: Self-pay | Admitting: Oncology

## 2023-04-19 ENCOUNTER — Other Ambulatory Visit: Payer: Self-pay | Admitting: Cardiology

## 2023-04-20 ENCOUNTER — Other Ambulatory Visit (HOSPITAL_BASED_OUTPATIENT_CLINIC_OR_DEPARTMENT_OTHER): Payer: Self-pay

## 2023-04-20 MED ORDER — FLECAINIDE ACETATE 50 MG PO TABS
50.0000 mg | ORAL_TABLET | Freq: Two times a day (BID) | ORAL | 5 refills | Status: DC
  Filled 2023-04-20: qty 60, 30d supply, fill #0
  Filled 2023-04-26: qty 180, 90d supply, fill #0

## 2023-04-26 ENCOUNTER — Other Ambulatory Visit (HOSPITAL_BASED_OUTPATIENT_CLINIC_OR_DEPARTMENT_OTHER): Payer: Self-pay

## 2023-04-26 ENCOUNTER — Other Ambulatory Visit: Payer: Self-pay | Admitting: Cardiology

## 2023-04-28 ENCOUNTER — Telehealth: Payer: Self-pay | Admitting: Cardiology

## 2023-04-28 ENCOUNTER — Other Ambulatory Visit (HOSPITAL_BASED_OUTPATIENT_CLINIC_OR_DEPARTMENT_OTHER): Payer: Self-pay

## 2023-04-28 MED ORDER — FLECAINIDE ACETATE 50 MG PO TABS
50.0000 mg | ORAL_TABLET | Freq: Two times a day (BID) | ORAL | 1 refills | Status: DC
  Filled 2023-04-28: qty 180, 90d supply, fill #0
  Filled 2023-07-24: qty 180, 90d supply, fill #1

## 2023-04-28 NOTE — Telephone Encounter (Signed)
Rx(s) sent to pharmacy electronically. Patient has been notified directly and voiced understanding.  

## 2023-04-28 NOTE — Telephone Encounter (Signed)
*  STAT* If patient is at the pharmacy, call can be transferred to refill team.   1. Which medications need to be refilled? (please list name of each medication and dose if known) flecainide (TAMBOCOR) 50 MG tablet   4. Which pharmacy/location (including street and city if local pharmacy) is medication to be sent to?[facility]    5. Do they need a 30 day or 90 day supply? PT IS REQUESTING A 90 DAY SUPPLY, NOT 30 DAY.

## 2023-05-02 ENCOUNTER — Telehealth (INDEPENDENT_AMBULATORY_CARE_PROVIDER_SITE_OTHER): Payer: 59 | Admitting: Physician Assistant

## 2023-05-02 ENCOUNTER — Encounter: Payer: Self-pay | Admitting: Physician Assistant

## 2023-05-02 DIAGNOSIS — M791 Myalgia, unspecified site: Secondary | ICD-10-CM | POA: Insufficient documentation

## 2023-05-02 DIAGNOSIS — G43511 Persistent migraine aura without cerebral infarction, intractable, with status migrainosus: Secondary | ICD-10-CM

## 2023-05-02 DIAGNOSIS — M2550 Pain in unspecified joint: Secondary | ICD-10-CM | POA: Insufficient documentation

## 2023-05-02 HISTORY — DX: Myalgia, unspecified site: M79.10

## 2023-05-02 HISTORY — DX: Pain in unspecified joint: M25.50

## 2023-05-02 NOTE — Progress Notes (Signed)
..Virtual Visit via Video Note  I connected with Jennifer Abbott on 05/02/23 at  1:20 PM EDT by a video enabled telemedicine application and verified that I am speaking with the correct person using two identifiers.  Location: Patient: work Provider: clinic  .Marland KitchenParticipating in visit:  Patient: Hospital doctor Provider: Tandy Gaw PA-C   I discussed the limitations of evaluation and management by telemedicine and the availability of in person appointments. The patient expressed understanding and agreed to proceed.  History of Present Illness: Pt is a 42 yo female who needs her FMLA renewed for her migraines. Overall her migraines are controlled. She has had 3-4 in the last 6 months. For the most part maxalt rescues her well. She has not needed preventative. Her last FMLA was written for 1-2 episodes a month for 1-3 days.   Noticed for the last few months more aches and pains in muscles and joints. Not done anything about it. Concerned it could be lipitor side effect.   Active Ambulatory Problems    Diagnosis Date Noted   Ventricular bigeminy 05/28/2013   Abnormal weight gain 04/23/2014   Depression 09/30/2014   Intractable migraine without aura and with status migrainosus 09/30/2014   Palpitations 03/03/2015   History of ITP 02/20/2017   History of lower GI bleeding 08/17/2017   Trouble in sleeping 08/17/2017   Inattention 08/17/2017   Elevated LDL cholesterol level 01/13/2018   Suspicious nevus 10/31/2018   Dysmenorrhea 11/29/2018   Orthostatic hypotension 05/25/2019   History of 2019 novel coronavirus disease (COVID-19) 06/26/2019   Fracture, sacrum/coccyx (HCC) 07/21/2020   Paronychia of great toe of left foot 12/09/2020   Migraine aura, persistent, intractable, with status migrainosus 04/28/2022   Myalgia 05/02/2023   Multiple joint pain 05/02/2023   Resolved Ambulatory Problems    Diagnosis Date Noted   Migraine equivalent 12/07/2012   Fracture of metatarsal of left foot,  closed 05/28/2013   IDA (iron deficiency anemia) 06/18/2013   Paronychia 11/22/2013   Cystitis 12/20/2013   Vaginitis and vulvovaginitis 01/23/2014   Foreign body in skin of finger 02/13/2014   Skin tag 02/14/2014   Mood changes (HCC) 09/30/2014   Abnormal weight gain 09/08/2015   Dental infection 10/24/2015   Left maxillary sinusitis 10/24/2015   Infection of tooth 10/27/2015   Keratosis pilaris 11/27/2015   Sleeping difficulties 04/16/2016   Acute abdominal pain 02/14/2017   Hematochezia 02/20/2017   Acute blood loss anemia 02/20/2017   Thrombocytopenia (HCC) 02/20/2017   Colitis 02/20/2017   Leukocytosis 02/20/2017   Ulcerative pancolitis with complication (HCC) 04/04/2017   No energy 04/26/2017   GI bleed 08/17/2017   Cat bite 12/22/2018   Past Medical History:  Diagnosis Date   Anemia    Anxiety    Blood transfusion without reported diagnosis    Clotting disorder (HCC)     Observations/Objective: No acute distress Normal mood and appearance Normal breathing   Assessment and Plan: Marland KitchenMarland KitchenDiagnoses and all orders for this visit:  Migraine aura, persistent, intractable, with status migrainosus  Myalgia  Multiple joint pain   Pt does not need maxalt refilled at this time Doing well FMLA to be filled out to continue 1-2 episodes with 1-3 days per episode.   Unclear etiology of joint pain Pt would like to stop her lipitor and see if makes a difference Ok to stop for 2 weeks and see if pain resolves Certainly could also be some aging pain, could start tumeric.  Consider low weight resistance exercises to work  on building muscle   Follow Up Instructions:    I discussed the assessment and treatment plan with the patient. The patient was provided an opportunity to ask questions and all were answered. The patient agreed with the plan and demonstrated an understanding of the instructions.   The patient was advised to call back or seek an in-person evaluation if  the symptoms worsen or if the condition fails to improve as anticipated.   Tandy Gaw, PA-C

## 2023-05-09 ENCOUNTER — Encounter: Payer: Self-pay | Admitting: Physician Assistant

## 2023-05-09 NOTE — Telephone Encounter (Signed)
Was this done?

## 2023-05-15 ENCOUNTER — Encounter: Payer: Self-pay | Admitting: Physician Assistant

## 2023-05-16 ENCOUNTER — Other Ambulatory Visit (HOSPITAL_BASED_OUTPATIENT_CLINIC_OR_DEPARTMENT_OTHER): Payer: Self-pay

## 2023-05-16 ENCOUNTER — Other Ambulatory Visit: Payer: Self-pay

## 2023-05-16 MED ORDER — DROSPIRENONE-ETHINYL ESTRADIOL 3-0.02 MG PO TABS
1.0000 | ORAL_TABLET | Freq: Every day | ORAL | 3 refills | Status: DC
  Filled 2023-05-16: qty 84, 84d supply, fill #0
  Filled 2023-09-18: qty 84, 84d supply, fill #1
  Filled 2023-12-03: qty 84, 84d supply, fill #2
  Filled 2024-02-16: qty 84, 84d supply, fill #3

## 2023-05-19 ENCOUNTER — Other Ambulatory Visit (HOSPITAL_BASED_OUTPATIENT_CLINIC_OR_DEPARTMENT_OTHER): Payer: Self-pay

## 2023-05-19 ENCOUNTER — Other Ambulatory Visit: Payer: Self-pay | Admitting: Physician Assistant

## 2023-05-20 ENCOUNTER — Other Ambulatory Visit (HOSPITAL_BASED_OUTPATIENT_CLINIC_OR_DEPARTMENT_OTHER): Payer: Self-pay

## 2023-05-20 ENCOUNTER — Other Ambulatory Visit: Payer: Self-pay

## 2023-05-20 MED ORDER — RIZATRIPTAN BENZOATE 10 MG PO TBDP
10.0000 mg | ORAL_TABLET | ORAL | 3 refills | Status: AC | PRN
  Filled 2023-05-20 (×2): qty 30, 90d supply, fill #0

## 2023-06-08 ENCOUNTER — Other Ambulatory Visit: Payer: Self-pay | Admitting: Nurse Practitioner

## 2023-06-08 ENCOUNTER — Other Ambulatory Visit (HOSPITAL_BASED_OUTPATIENT_CLINIC_OR_DEPARTMENT_OTHER): Payer: Self-pay

## 2023-06-08 DIAGNOSIS — L811 Chloasma: Secondary | ICD-10-CM

## 2023-06-08 MED ORDER — HYDROQUINONE 4 % EX CREA
TOPICAL_CREAM | Freq: Two times a day (BID) | CUTANEOUS | 3 refills | Status: AC
  Filled 2023-06-08: qty 28.35, 30d supply, fill #0
  Filled 2023-07-24: qty 28.35, 30d supply, fill #1

## 2023-06-09 ENCOUNTER — Other Ambulatory Visit (HOSPITAL_BASED_OUTPATIENT_CLINIC_OR_DEPARTMENT_OTHER): Payer: Self-pay

## 2023-06-28 ENCOUNTER — Other Ambulatory Visit (HOSPITAL_BASED_OUTPATIENT_CLINIC_OR_DEPARTMENT_OTHER): Payer: Self-pay

## 2023-06-28 ENCOUNTER — Encounter: Payer: Self-pay | Admitting: Oncology

## 2023-07-04 NOTE — Progress Notes (Unsigned)
  Electrophysiology Office Note:   Date:  07/04/2023  ID:  Jennifer Abbott, DOB March 23, 1981, MRN [id number]  Primary Cardiologist: None Electrophysiologist: Will Jorja Loa, MD  {Click to update primary MD,subspecialty MD or APP then REFRESH:1}    History of Present Illness:   Jennifer Abbott is a 42 y.o. female with h/o HLD and PVCs seen today for routine electrophysiology followup.   Since last being seen in our clinic the patient reports doing ***.  she denies chest pain, palpitations, dyspnea, PND, orthopnea, nausea, vomiting, dizziness, syncope, edema, weight gain, or early satiety.   Review of systems complete and found to be negative unless listed in HPI.   EP Information / Studies Reviewed:    EKG is ordered today. Personal review as below.       Cardiac monitor 11/13/2020 personally reviewed Sinus rhythm with frequent PVCs and occasional PAC.  Burden of PVCs 1.5%, 6533.  Physical Exam:   VS:  There were no vitals taken for this visit.   Wt Readings from Last 3 Encounters:  02/16/23 126 lb (57.2 kg)  09/29/22 114 lb 12.8 oz (52.1 kg)  04/28/22 119 lb (54 kg)     GEN: Well nourished, well developed in no acute distress NECK: No JVD; No carotid bruits CARDIAC: {EPRHYTHM:28826}, no murmurs, rubs, gallops RESPIRATORY:  Clear to auscultation without rales, wheezing or rhonchi  ABDOMEN: Soft, non-tender, non-distended EXTREMITIES:  No edema; No deformity   ASSESSMENT AND PLAN:    PVCs Palpitations EKG today shows *** Prior monitor with <2% burden Continue flecainide 50 mg BID Consider BB/CCB as needed if palpitations continued  HLD Continue statin    {Click here to Review PMH, Prob List, Meds, Allergies, SHx, FHx  :1}   Follow up with {WUJWJ:19147} {EPFOLLOW WG:[id number]}  Signed, Graciella Freer, PA-C

## 2023-07-05 ENCOUNTER — Encounter: Payer: Self-pay | Admitting: Student

## 2023-07-05 ENCOUNTER — Ambulatory Visit: Payer: 59 | Attending: Student | Admitting: Student

## 2023-07-05 VITALS — BP 118/80 | HR 72 | Ht 65.0 in | Wt 140.2 lb

## 2023-07-05 DIAGNOSIS — E7800 Pure hypercholesterolemia, unspecified: Secondary | ICD-10-CM

## 2023-07-05 DIAGNOSIS — R002 Palpitations: Secondary | ICD-10-CM

## 2023-07-05 DIAGNOSIS — I493 Ventricular premature depolarization: Secondary | ICD-10-CM | POA: Diagnosis not present

## 2023-07-05 NOTE — Patient Instructions (Signed)
Medication Instructions:  Your physician recommends that you continue on your current medications as directed. Please refer to the Current Medication list given to you today.  *If you need a refill on your cardiac medications before your next appointment, please call your pharmacy*  Lab Work: None ordered If you have labs (blood work) drawn today and your tests are completely normal, you will receive your results only by: MyChart Message (if you have MyChart) OR A paper copy in the mail If you have any lab test that is abnormal or we need to change your treatment, we will call you to review the results.  Follow-Up: At [facility], you and your health needs are our priority.  As part of our continuing mission to provide you with exceptional heart care, we have created designated Provider Care Teams.  These Care Teams include your primary Cardiologist (physician) and Advanced Practice Providers (APPs -  Physician Assistants and Nurse Practitioners) who all work together to provide you with the care you need, when you need it.  Your next appointment:   6 month(s)  Provider:   Will Camnitz, MD  

## 2023-07-25 ENCOUNTER — Other Ambulatory Visit: Payer: Self-pay

## 2023-07-25 ENCOUNTER — Other Ambulatory Visit (HOSPITAL_BASED_OUTPATIENT_CLINIC_OR_DEPARTMENT_OTHER): Payer: Self-pay

## 2023-09-19 ENCOUNTER — Encounter: Payer: Self-pay | Admitting: Oncology

## 2023-09-19 ENCOUNTER — Other Ambulatory Visit (HOSPITAL_BASED_OUTPATIENT_CLINIC_OR_DEPARTMENT_OTHER): Payer: Self-pay

## 2023-09-21 ENCOUNTER — Other Ambulatory Visit (HOSPITAL_BASED_OUTPATIENT_CLINIC_OR_DEPARTMENT_OTHER): Payer: Self-pay

## 2023-10-07 NOTE — Telephone Encounter (Signed)
 Contacted patient and advised information from [place], [place]. Pt made an appointment for 4/1 with Mardelle Matte, PA-C. Pt reports that she will monitor symptoms through the weekend and will let us know on Monday if she wants to do the live monitor. Pt report that she currently does not have health insurance.

## 2023-10-11 ENCOUNTER — Ambulatory Visit (INDEPENDENT_AMBULATORY_CARE_PROVIDER_SITE_OTHER): Payer: Self-pay | Admitting: Physician Assistant

## 2023-10-11 ENCOUNTER — Encounter: Payer: Self-pay | Admitting: Physician Assistant

## 2023-10-11 ENCOUNTER — Encounter: Payer: Self-pay | Admitting: Oncology

## 2023-10-11 ENCOUNTER — Other Ambulatory Visit (HOSPITAL_BASED_OUTPATIENT_CLINIC_OR_DEPARTMENT_OTHER): Payer: Self-pay

## 2023-10-11 VITALS — BP 128/71 | HR 84

## 2023-10-11 DIAGNOSIS — I498 Other specified cardiac arrhythmias: Secondary | ICD-10-CM

## 2023-10-11 DIAGNOSIS — R002 Palpitations: Secondary | ICD-10-CM

## 2023-10-11 DIAGNOSIS — F419 Anxiety disorder, unspecified: Secondary | ICD-10-CM

## 2023-10-11 DIAGNOSIS — R0789 Other chest pain: Secondary | ICD-10-CM

## 2023-10-11 MED ORDER — LORAZEPAM 0.5 MG PO TABS
0.5000 mg | ORAL_TABLET | Freq: Two times a day (BID) | ORAL | 0 refills | Status: DC | PRN
  Filled 2023-10-11: qty 15, 8d supply, fill #0

## 2023-10-11 NOTE — Telephone Encounter (Signed)
 Just wanted to provide this update before ordering monitor to make sure you didn't have any new recommendations

## 2023-10-11 NOTE — Assessment & Plan Note (Addendum)
 Pt now managed on flecainide 50 mg, was tolerating very well with limited breakthroughs until late last week. EKG today appears normal sinus, no changes from comparison 06/2023. Vitals otherwise normal

## 2023-10-11 NOTE — Progress Notes (Signed)
 Established patient visit   Patient: Jennifer Abbott   DOB: 1981-01-07   43 y.o. Female  MRN: [id number] Visit Date: 10/11/2023  Today's healthcare provider: Alfredia Ferguson, PA-C   Cc. Chest discomfort, heart palpitations   Subjective    Pt, with a h/o ventricular bigeminy, reports feeling intermittent twinges in her chest, L arm, and L back since Thursday. Denies crushing pain, deep persistent aching pain. Some worsening Thurs/Fri, yesterday/today, with activity. Intermittent palpitations yesterday, today. Through the weekend, no symptoms-- denies pain or palpitations.   Today, she walked upstairs, felt lightheaded and needed to sit down. Symptoms resolved after a few seconds. Reports high levels of anxiety today, took 1/4 of a either 0.5 mg or 1 mg lorazepam dose, overall feels a bit better.  She reports some intermittent reflux symptoms, took a pepcid yesterday for a flushing on her chest that she though was 2/2 to anxiety, and all her symptoms improved. Medications: Outpatient Medications Prior to Visit  Medication Sig   atorvastatin (LIPITOR) 10 MG tablet Take 1 tablet (10 mg total) by mouth daily.   drospirenone-ethinyl estradiol (YAZ) 3-0.02 MG tablet Take 1 tablet by mouth daily. Skip placebos.   flecainide (TAMBOCOR) 50 MG tablet Take 1 tablet by mouth 2 times daily.   hydroquinone 4 % cream Apply topically 2 (two) times daily. Use with tretinoin at bedtime and alone in the morning.   rizatriptan (MAXALT-MLT) 10 MG disintegrating tablet Take 1 tablet (10 mg total) by mouth as needed for migraine. May repeat in 2 hours if needed.   tretinoin (RETIN-A) 0.1 % cream Apply topically at bedtime.   No facility-administered medications prior to visit.    Review of Systems  Constitutional:  Negative for fatigue and fever.  Respiratory:  Positive for chest tightness. Negative for cough and shortness of breath.   Cardiovascular:  Positive for palpitations. Negative for chest pain  and leg swelling.  Gastrointestinal:  Negative for abdominal pain.  Neurological:  Negative for dizziness and headaches.       Objective    BP 128/71   Pulse 84    Physical Exam Constitutional:      General: She is awake.     Appearance: She is well-developed.  HENT:     Head: Normocephalic.  Eyes:     Conjunctiva/sclera: Conjunctivae normal.  Cardiovascular:     Rate and Rhythm: Normal rate and regular rhythm.     Heart sounds: Normal heart sounds.  Pulmonary:     Effort: Pulmonary effort is normal.     Breath sounds: Normal breath sounds.  Skin:    General: Skin is warm.  Neurological:     Mental Status: She is alert and oriented to person, place, and time.  Psychiatric:        Attention and Perception: Attention normal.        Mood and Affect: Mood is anxious.        Speech: Speech normal.        Behavior: Behavior is cooperative.     No results found for any visits on 10/11/23.  Assessment & Plan    Ventricular bigeminy Assessment & Plan: Pt now managed on flecainide 50 mg, was tolerating very well with limited breakthroughs until late last week. EKG today appears normal sinus, no changes from comparison 06/2023. Vitals otherwise normal    Palpitation -     EKG 12-Lead  Chest discomfort  Anxiety -     LORazepam; Take 1  tablet (0.5 mg total) by mouth 2 (two) times daily as needed for anxiety.  Dispense: 15 tablet; Refill: 0   Recommending trial of pepcid/tums for reflux etiology, rx lorazepam 0.5 mg 1/2-1 pill bid prn for anxiety.   Pt has messaged her cardiologist-- they recommend another Holter monitor. Pt will consider.  Return if symptoms worsen or fail to improve.       Alfredia Ferguson, PA-C  [facility] [phone] (phone) [phone] (fax)  [facility]

## 2023-10-25 ENCOUNTER — Ambulatory Visit: Payer: Self-pay | Admitting: Student

## 2023-11-02 ENCOUNTER — Other Ambulatory Visit: Payer: Self-pay | Admitting: Cardiology

## 2023-11-02 ENCOUNTER — Encounter: Payer: Self-pay | Admitting: Oncology

## 2023-11-02 ENCOUNTER — Other Ambulatory Visit (HOSPITAL_BASED_OUTPATIENT_CLINIC_OR_DEPARTMENT_OTHER): Payer: Self-pay

## 2023-11-02 MED ORDER — FLECAINIDE ACETATE 50 MG PO TABS
50.0000 mg | ORAL_TABLET | Freq: Two times a day (BID) | ORAL | 1 refills | Status: DC
  Filled 2023-11-02: qty 180, 90d supply, fill #0
  Filled 2024-02-03: qty 180, 90d supply, fill #1

## 2023-11-03 ENCOUNTER — Other Ambulatory Visit (HOSPITAL_BASED_OUTPATIENT_CLINIC_OR_DEPARTMENT_OTHER): Payer: Self-pay

## 2023-11-14 NOTE — Progress Notes (Signed)
  Electrophysiology Office Note:   Date:  11/15/2023  ID:  Jennifer Abbott, DOB Jan 23, 1981, MRN [id number]  Primary Cardiologist: None Electrophysiologist: Will Cortland Ding, MD      History of Present Illness:   Jennifer Abbott is a 43 y.o. female with h/o HLD and PVCs seen today for routine electrophysiology followup.   Since last being seen in our clinic the patient reports an increase in her palpitations over the past 6 weeks. Noted to be somewhat tied to anxiety. Feels better after her Ativan  at times, as well as noting a DECREASE if she has had alcohol. Not a heavy drinker. Taking flecainide  BID without missed doses.  Palpitations are sometimes associated with fullness/pounding in neck or pressure, usually only lasts for a few seconds. No clear aggravating factors. Has issues resting and sleeping at night at times.   Review of systems complete and found to be negative unless listed in HPI.   EP Information / Studies Reviewed:    EKG is ordered today. Personal review as below.  EKG Interpretation Date/Time:  Tuesday November 15 2023 08:40:29 EDT Ventricular Rate:  100 PR Interval:  128 QRS Duration:  80 QT Interval:  368 QTC Calculation: 474 R Axis:   -29  Text Interpretation: Normal sinus rhythm Normal ECG When compared with ECG of 05-Jul-2023 09:40, No significant change was found Confirmed by Pilar Bridge [phone]) on 11/15/2023 8:49:44 AM    Arrhythmia/Device History No specialty comments available.   Physical Exam:   VS:  BP 116/76   Pulse 100   Ht 5' 5.5" (1.664 m)   Wt 135 lb 4.8 oz (61.4 kg)   SpO2 98%   BMI 22.17 kg/m    Wt Readings from Last 3 Encounters:  11/15/23 135 lb 4.8 oz (61.4 kg)  07/05/23 140 lb 3.2 oz (63.6 kg)  02/16/23 126 lb (57.2 kg)     GEN: No acute distress NECK: No JVD; No carotid bruits CARDIAC: Regular rate and rhythm, no murmurs, rubs, gallops RESPIRATORY:  Clear to auscultation without rales, wheezing or rhonchi  ABDOMEN: Soft,  non-tender, non-distended EXTREMITIES:  No edema; No deformity   ASSESSMENT AND PLAN:    PVCs Palpitations EKG today shows sinus tachycardia.  Prior monitor with <2% burden, and updating is unlikely to change recommendation to attempt BB or CC.  Continue flecainide  50 mg BID. Discussed increasing. Suspect may get more relief from additive effects from BB or CCB first.  Add diltiazem  120 mg daily Watch has shown PACs and PVCs at times of symptoms.   Will plan ETT to assess response to exercise and monitoring of flecainide , given chest discomfort at times.    HLD Continue statin   Chest discomfort Non-exertional, but associated with her palpitations at times.   Follow up with EP APP in 6 months as placeholder. Sooner follow up pending results of ETT and symptoms.   Signed, Tylene Galla, PA-C

## 2023-11-15 ENCOUNTER — Encounter: Payer: Self-pay | Admitting: Oncology

## 2023-11-15 ENCOUNTER — Other Ambulatory Visit (HOSPITAL_BASED_OUTPATIENT_CLINIC_OR_DEPARTMENT_OTHER): Payer: Self-pay

## 2023-11-15 ENCOUNTER — Ambulatory Visit: Payer: Self-pay | Attending: Student | Admitting: Student

## 2023-11-15 ENCOUNTER — Encounter (HOSPITAL_COMMUNITY): Payer: Self-pay

## 2023-11-15 ENCOUNTER — Encounter: Payer: Self-pay | Admitting: Student

## 2023-11-15 VITALS — BP 116/76 | HR 100 | Ht 65.5 in | Wt 135.3 lb

## 2023-11-15 DIAGNOSIS — R0789 Other chest pain: Secondary | ICD-10-CM

## 2023-11-15 DIAGNOSIS — R002 Palpitations: Secondary | ICD-10-CM

## 2023-11-15 DIAGNOSIS — Z79899 Other long term (current) drug therapy: Secondary | ICD-10-CM

## 2023-11-15 DIAGNOSIS — I493 Ventricular premature depolarization: Secondary | ICD-10-CM

## 2023-11-15 MED ORDER — DILTIAZEM HCL ER COATED BEADS 120 MG PO CP24
120.0000 mg | ORAL_CAPSULE | Freq: Every day | ORAL | 3 refills | Status: DC
  Filled 2023-11-15: qty 90, 90d supply, fill #0

## 2023-11-15 NOTE — Patient Instructions (Addendum)
 Medication Instructions:  Start diltiazem  120 mg daily *If you need a refill on your cardiac medications before your next appointment, please call your pharmacy*  Lab Work: None ordered If you have labs (blood work) drawn today and your tests are completely normal, you will receive your results only by: MyChart Message (if you have MyChart) OR A paper copy in the mail If you have any lab test that is abnormal or we need to change your treatment, we will call you to review the results.  Testing/Procedures: Your provider has recommended that you have an exercise tolerance test. Noting to eat or drink and no smoking 3 hours prior. Come dressed with appropriate clothing and shoes for exercise. Do not take diltiazem  on the morning of the test, but do take flecainide .   Follow-Up: At [facility], you and your health needs are our priority.  As part of our continuing mission to provide you with exceptional heart care, our providers are all part of one team.  This team includes your primary Cardiologist (physician) and Advanced Practice Providers or APPs (Physician Assistants and Nurse Practitioners) who all work together to provide you with the care you need, when you need it.  Your next appointment:   6 month(s)  Provider:   Agatha Horsfall, MD or Joycelyn Noa" [place], PA-C       1st Floor: - Lobby - Registration  - Pharmacy  - Lab - Cafe  2nd Floor: - PV Lab - Diagnostic Testing (echo, CT, nuclear med)  3rd Floor: - Vacant  4th Floor: - TCTS (cardiothoracic surgery) - AFib Clinic - Structural Heart Clinic - Vascular Surgery  - Vascular Ultrasound  5th Floor: - HeartCare Cardiology (general and EP) - Clinical Pharmacy for coumadin, hypertension, lipid, weight-loss medications, and med management appointments    Valet parking services will be available as well.

## 2023-11-24 ENCOUNTER — Ambulatory Visit (HOSPITAL_COMMUNITY): Payer: Self-pay | Attending: Student

## 2023-11-24 DIAGNOSIS — Z79899 Other long term (current) drug therapy: Secondary | ICD-10-CM | POA: Insufficient documentation

## 2023-11-24 DIAGNOSIS — R002 Palpitations: Secondary | ICD-10-CM | POA: Insufficient documentation

## 2023-11-24 DIAGNOSIS — R0789 Other chest pain: Secondary | ICD-10-CM | POA: Insufficient documentation

## 2023-11-24 LAB — EXERCISE TOLERANCE TEST
Angina Index: 0
Duke Treadmill Score: 9
Estimated workload: 10.1
Exercise duration (min): 8 min
Exercise duration (sec): 45 s
MPHR: 175 {beats}/min
Peak HR: 173 {beats}/min
Percent HR: 98 %
Rest HR: 91 {beats}/min
ST Depression (mm): 0 mm

## 2023-11-25 NOTE — Progress Notes (Signed)
 Result has been reviewed by patient in mychart.

## 2023-11-28 ENCOUNTER — Encounter: Payer: Self-pay | Admitting: Oncology

## 2023-12-01 ENCOUNTER — Ambulatory Visit: Payer: Self-pay | Admitting: Nurse Practitioner

## 2023-12-01 ENCOUNTER — Other Ambulatory Visit (HOSPITAL_BASED_OUTPATIENT_CLINIC_OR_DEPARTMENT_OTHER): Payer: Self-pay

## 2023-12-01 ENCOUNTER — Encounter: Payer: Self-pay | Admitting: Nurse Practitioner

## 2023-12-01 ENCOUNTER — Encounter: Payer: Self-pay | Admitting: Oncology

## 2023-12-01 VITALS — BP 124/70 | HR 100 | Ht 65.5 in | Wt 134.4 lb

## 2023-12-01 DIAGNOSIS — F419 Anxiety disorder, unspecified: Secondary | ICD-10-CM

## 2023-12-01 DIAGNOSIS — R002 Palpitations: Secondary | ICD-10-CM

## 2023-12-01 DIAGNOSIS — K2960 Other gastritis without bleeding: Secondary | ICD-10-CM

## 2023-12-01 MED ORDER — CITALOPRAM HYDROBROMIDE 20 MG PO TABS
20.0000 mg | ORAL_TABLET | Freq: Every day | ORAL | 12 refills | Status: DC
  Filled 2023-12-01: qty 30, 30d supply, fill #0

## 2023-12-01 MED ORDER — LORAZEPAM 0.5 MG PO TABS: 0.5000 mg | ORAL_TABLET | Freq: Two times a day (BID) | ORAL | 0 refills | Status: AC | PRN

## 2023-12-01 MED ORDER — PANTOPRAZOLE SODIUM 40 MG PO TBEC
40.0000 mg | DELAYED_RELEASE_TABLET | Freq: Every day | ORAL | 3 refills | Status: DC
  Filled 2023-12-01: qty 30, 30d supply, fill #0
  Filled 2024-02-01: qty 30, 30d supply, fill #1

## 2023-12-01 NOTE — Progress Notes (Signed)
 Dell Fennel, DNP, AGNP-c [facility]  [address] [place], [place] [address] [phone]  ESTABLISHED PATIENT- Chronic Health and/or Follow-Up Visit  Blood pressure 124/70, pulse 100, height 5' 5.5" (1.664 m), weight 134 lb 6.4 oz (61 kg), SpO2 98%.    Jennifer Abbott is a 43 y.o. year old female presenting today for evaluation and management of chronic conditions.   History of Present Illness Jennifer Abbott is a 43 year old female who presents with anxiety and palpitations.  She has been experiencing anxiety and palpitations for almost two months. She feels anxious and has a racing heart and has been unable to exercise for approximately two and a half weeks due to her symptoms.  She wakes up between 3 and 4 AM and has difficulty falling back asleep, often feeling her heartbeat and experiencing premature ventricular contractions (PVCs), which increase her anxiety. She takes half a lorazepam  at bedtime, which helps reduce panic upon waking, although she still wakes up during the night.  She experiences intermittent chest discomfort, described as a pulling sensation across her chest and up to her neck, and occasional pain under her breast, which she wonders might be related to gastrointestinal issues. She has been taking omeprazole for a few days but is unsure of its effectiveness.  Her current medications include flecainide , which she believes is reducing the frequency of PVCs, and diltiazem , though she questions its efficacy. She has a history of using trazodone  at bedtime but has not taken it recently.  She is concerned about her weight loss despite not exercising and eating freely, attributing it to possible increased metabolism or anxiety.  Her family history includes her mother, who is dealing with significant health issues, including a thymoma and autoimmune conditions, which have been a source of stress for her.  All ROS negative with exception of what is  listed above.   PHYSICAL EXAM Physical Exam Vitals and nursing note reviewed.  Constitutional:      Appearance: Normal appearance. She is not ill-appearing or toxic-appearing.  HENT:     Head: Normocephalic.  Eyes:     Conjunctiva/sclera: Conjunctivae normal.  Cardiovascular:     Rate and Rhythm: Normal rate and regular rhythm.     Pulses: Normal pulses.     Heart sounds: Normal heart sounds. No murmur heard.    No friction rub. No gallop.  Pulmonary:     Effort: Pulmonary effort is normal.     Breath sounds: Normal breath sounds.  Abdominal:     General: Bowel sounds are normal. There is no distension.     Palpations: Abdomen is soft.     Tenderness: There is no abdominal tenderness. There is no guarding.  Musculoskeletal:        General: Normal range of motion.  Skin:    General: Skin is warm and dry.     Capillary Refill: Capillary refill takes less than 2 seconds.  Neurological:     Mental Status: She is alert and oriented to person, place, and time.     Motor: No weakness.  Psychiatric:        Mood and Affect: Mood normal.        Behavior: Behavior normal.      PLAN Problem List Items Addressed This Visit     Heart palpitations   Intermittent palpitations, possibly related to anxiety and exacerbated by stress. Flecainide  is reducing the frequency of PVCs. Discussed potential GERD contribution to symptoms. - Continue flecainide  as prescribed.  Relevant Orders   CBC with Differential/Platelet (Completed)   Comprehensive metabolic panel with GFR (Completed)   TSH (Completed)   Anxiety - Primary   Chronic anxiety exacerbated by recent stressors, including family health issues and personal health concerns. Symptoms include nocturnal palpitations, tingling, and hot sensations, consistent with anxiety and panic. Lorazepam  at bedtime has reduced panic upon waking. Anxiety may contribute to palpitations and gastrointestinal symptoms. Consideration of initiating  daily medication for anxiety management. - Prescribe citalopram  10 mg, instruct to split tablets for a 5 mg dose daily. - Continue lorazepam  0.5 mg as needed for acute anxiety, with a plan to refill.       Relevant Medications   citalopram  (CELEXA ) 20 MG tablet   LORazepam  (ATIVAN ) 0.5 MG tablet   Reflux gastritis   Symptoms suggestive of GERD, including chest discomfort and pulling sensations, possibly exacerbated by anxiety. Omeprazole has been taken intermittently without clear benefit. GERD may contribute to palpitations and chest discomfort. - Continue omeprazole for a minimum of 30 days to assess efficacy. - Consider pantoprazole  if omeprazole is not effective.      Relevant Medications   pantoprazole  (PROTONIX ) 40 MG tablet    Return for tbd.  Dell Fennel, DNP, AGNP-c

## 2023-12-03 LAB — COMPREHENSIVE METABOLIC PANEL WITH GFR
ALT: 10 IU/L (ref 0–32)
AST: 13 IU/L (ref 0–40)
Albumin: 4.6 g/dL (ref 3.9–4.9)
Alkaline Phosphatase: 41 IU/L — ABNORMAL LOW (ref 44–121)
BUN/Creatinine Ratio: 17 (ref 9–23)
BUN: 13 mg/dL (ref 6–24)
Bilirubin Total: 0.4 mg/dL (ref 0.0–1.2)
CO2: 20 mmol/L (ref 20–29)
Calcium: 9.2 mg/dL (ref 8.7–10.2)
Chloride: 99 mmol/L (ref 96–106)
Creatinine, Ser: 0.76 mg/dL (ref 0.57–1.00)
Globulin, Total: 2.6 g/dL (ref 1.5–4.5)
Glucose: 97 mg/dL (ref 70–99)
Potassium: 3.8 mmol/L (ref 3.5–5.2)
Sodium: 137 mmol/L (ref 134–144)
Total Protein: 7.2 g/dL (ref 6.0–8.5)
eGFR: 98 mL/min/{1.73_m2} (ref >59)

## 2023-12-03 LAB — CBC WITH DIFFERENTIAL/PLATELET
Basophils Absolute: 0.1 10*3/uL (ref 0.0–0.2)
Basos: 1 %
EOS (ABSOLUTE): 0.2 10*3/uL (ref 0.0–0.4)
Eos: 2 %
Hematocrit: 37.2 % (ref 34.0–46.6)
Hemoglobin: 12.5 g/dL (ref 11.1–15.9)
Immature Grans (Abs): 0.1 10*3/uL (ref 0.0–0.1)
Immature Granulocytes: 1 %
Lymphocytes Absolute: 2.6 10*3/uL (ref 0.7–3.1)
Lymphs: 25 %
MCH: 31.2 pg (ref 26.6–33.0)
MCHC: 33.6 g/dL (ref 31.5–35.7)
MCV: 93 fL (ref 79–97)
Monocytes Absolute: 0.8 10*3/uL (ref 0.1–0.9)
Monocytes: 7 %
Neutrophils Absolute: 6.9 10*3/uL (ref 1.4–7.0)
Neutrophils: 64 %
Platelets: 325 10*3/uL (ref 150–450)
RBC: 4.01 x10E6/uL (ref 3.77–5.28)
RDW: 11.1 % — ABNORMAL LOW (ref 11.7–15.4)
WBC: 10.6 10*3/uL (ref 3.4–10.8)

## 2023-12-03 LAB — TSH: TSH: 1.04 u[IU]/mL (ref 0.450–4.500)

## 2023-12-05 ENCOUNTER — Encounter: Payer: Self-pay | Admitting: Oncology

## 2023-12-05 ENCOUNTER — Other Ambulatory Visit (HOSPITAL_BASED_OUTPATIENT_CLINIC_OR_DEPARTMENT_OTHER): Payer: Self-pay

## 2023-12-06 ENCOUNTER — Other Ambulatory Visit (HOSPITAL_BASED_OUTPATIENT_CLINIC_OR_DEPARTMENT_OTHER): Payer: Self-pay

## 2023-12-20 DIAGNOSIS — F413 Other mixed anxiety disorders: Secondary | ICD-10-CM | POA: Insufficient documentation

## 2023-12-20 DIAGNOSIS — K2960 Other gastritis without bleeding: Secondary | ICD-10-CM | POA: Insufficient documentation

## 2023-12-20 DIAGNOSIS — F419 Anxiety disorder, unspecified: Secondary | ICD-10-CM | POA: Insufficient documentation

## 2023-12-20 NOTE — Assessment & Plan Note (Signed)
 Intermittent palpitations, possibly related to anxiety and exacerbated by stress. Flecainide  is reducing the frequency of PVCs. Discussed potential GERD contribution to symptoms. - Continue flecainide  as prescribed.

## 2023-12-20 NOTE — Assessment & Plan Note (Signed)
 Symptoms suggestive of GERD, including chest discomfort and pulling sensations, possibly exacerbated by anxiety. Omeprazole has been taken intermittently without clear benefit. GERD may contribute to palpitations and chest discomfort. - Continue omeprazole for a minimum of 30 days to assess efficacy. - Consider pantoprazole  if omeprazole is not effective.

## 2023-12-20 NOTE — Assessment & Plan Note (Signed)
 Chronic anxiety exacerbated by recent stressors, including family health issues and personal health concerns. Symptoms include nocturnal palpitations, tingling, and hot sensations, consistent with anxiety and panic. Lorazepam  at bedtime has reduced panic upon waking. Anxiety may contribute to palpitations and gastrointestinal symptoms. Consideration of initiating daily medication for anxiety management. - Prescribe citalopram  10 mg, instruct to split tablets for a 5 mg dose daily. - Continue lorazepam  0.5 mg as needed for acute anxiety, with a plan to refill.

## 2023-12-21 ENCOUNTER — Ambulatory Visit: Payer: Self-pay | Admitting: Nurse Practitioner

## 2024-02-01 ENCOUNTER — Encounter: Payer: Self-pay | Admitting: Oncology

## 2024-02-01 ENCOUNTER — Other Ambulatory Visit (HOSPITAL_BASED_OUTPATIENT_CLINIC_OR_DEPARTMENT_OTHER): Payer: Self-pay

## 2024-02-03 ENCOUNTER — Encounter: Payer: Self-pay | Admitting: Oncology

## 2024-02-03 ENCOUNTER — Other Ambulatory Visit (HOSPITAL_BASED_OUTPATIENT_CLINIC_OR_DEPARTMENT_OTHER): Payer: Self-pay

## 2024-02-07 ENCOUNTER — Telehealth: Payer: Self-pay | Admitting: Pharmacy Technician

## 2024-02-07 ENCOUNTER — Other Ambulatory Visit (HOSPITAL_COMMUNITY): Payer: Self-pay

## 2024-02-07 NOTE — Telephone Encounter (Signed)
   But this is ready

## 2024-02-17 ENCOUNTER — Other Ambulatory Visit (HOSPITAL_BASED_OUTPATIENT_CLINIC_OR_DEPARTMENT_OTHER): Payer: Self-pay

## 2024-02-17 ENCOUNTER — Encounter: Payer: Self-pay | Admitting: Oncology

## 2024-02-20 ENCOUNTER — Other Ambulatory Visit (HOSPITAL_BASED_OUTPATIENT_CLINIC_OR_DEPARTMENT_OTHER): Payer: Self-pay

## 2024-03-28 ENCOUNTER — Ambulatory Visit: Payer: Self-pay | Admitting: Nurse Practitioner

## 2024-03-28 ENCOUNTER — Encounter: Payer: Self-pay | Admitting: Nurse Practitioner

## 2024-03-28 ENCOUNTER — Telehealth (INDEPENDENT_AMBULATORY_CARE_PROVIDER_SITE_OTHER): Payer: Self-pay | Admitting: Nurse Practitioner

## 2024-03-28 VITALS — BP 123/79 | HR 92 | Wt 144.0 lb

## 2024-03-28 DIAGNOSIS — R0789 Other chest pain: Secondary | ICD-10-CM

## 2024-03-28 DIAGNOSIS — E7800 Pure hypercholesterolemia, unspecified: Secondary | ICD-10-CM

## 2024-03-28 DIAGNOSIS — R002 Palpitations: Secondary | ICD-10-CM

## 2024-03-28 DIAGNOSIS — F413 Other mixed anxiety disorders: Secondary | ICD-10-CM

## 2024-03-28 NOTE — Progress Notes (Signed)
 Virtual Visit Encounter telephone visit.   I connected with  Iona L Sheer on 04/16/24 at 10:00 AM EDT by secure audio telemedicine application. I verified that I am speaking with the correct person using two identifiers.   I introduced myself as a Publishing rights manager with the practice. The limitations of evaluation and management by telemedicine discussed with the patient and the availability of in person appointments. The patient expressed verbal understanding and consent to proceed.  Participating parties in this visit include: Myself and patient  The patient is: Patient Location: Other:  vehicle I am: Provider Location: Office/Clinic Subjective:    CC and HPI: BARNEY GERTSCH is a 43 y.o. year old female presenting for follow-up on palpitations, mood, and weight.   She is currently managed with Flecainide  50mg  BID for chronic frequent palpitations. Switching from atenolol  to metoprolol  several years ago and metoprolol  to Flecainide  in 2022 due to ongoing symptoms and hypotension with higher doses of metoprolol . Initially, her symptoms were fairly well managed on the Flecainide  but in the past year, she has noticed a significant uptick in symptoms. Current symptoms include chest tightness (starting midsternal and radiating bilaterally and inferior to breast tissue), mild tremors, intermittent fluttering sensation (lasting only a few beats), and increased anxiety.   Her anxiety is a significant concern. She has had several life events to contribute including the sudden passing of her ex-husband, severe illness in her mother, job changes and stressors, and financial concerns. She has been unable to maintain medication for anxiety on a daily basis due to interaction with the Flecainide , but has been using PRN lorazepam  in small doses to help when symptoms are particularly overwhelming. To help cope, she also admits to increasing her alcohol consumption in the evenings to 3-4 white claw or similar a  day.   She does not feel the symptoms listed above when consuming alcohol and she reports this is the only time her anxiety feels controlled and she can turn off everything going on around her.   Past medical history, Surgical history, Family history not pertinant except as noted below, Social history, Allergies, and medications have been entered into the medical record, reviewed, and corrections made.   Review of Systems:  All review of systems negative except what is listed in the HPI  Objective:    Alert and oriented x 4 Speaking in clear sentences with no shortness of breath. No distress.  Impression and Recommendations:    Problem List Items Addressed This Visit     Heart palpitations   Chronic palpitations currently managed with Flecainide . Unfortunately, despite the previous success of the medication, she has started to experience and up tick in palpitations and is also noting chest tightness/pressure and increased anxiety. We discussed that the Flecainide  may be contributing to the symptoms. Previous EKG's reviewed. Recommend slow titration off of Flecainide  after starting Bisoprolol  5mg  daily. We discussed that if her palpitations worsen to report this immediately and consider returning to the most recent dose of Flecainide  if she has tapered.       Elevated LDL cholesterol level   Continue on statin therapy for best management and protection from CVD. Low fat, high fiber diet along with daily exercise also recommended.       Relevant Medications   atorvastatin  (LIPITOR) 10 MG tablet   Other mixed anxiety disorders   Significant anxiety with intermittent panic symptoms related to life stressors. Her anxiety has recently increased to include daily fears for health and the health  of her loved ones. It is not clear if a single event has triggered this. We discussed trial off of Flecainide  to see if this could be contributing, but also to allow us  to try medication for her  anxiety symptoms. She will start the taper of Flecainide  after starting on Bisoprolol .       Sensation of chest pressure - Primary    orders and follow up as documented in EMR I discussed the assessment and treatment plan with the patient. The patient was provided an opportunity to ask questions and all were answered. The patient agreed with the plan and demonstrated an understanding of the instructions.   The patient was advised to call back or seek an in-person evaluation if the symptoms worsen or if the condition fails to improve as anticipated.  Follow-Up: in 3 months  Camie CHARLENA Doing, NP , DNP, AGNP-c  [facility]

## 2024-03-28 NOTE — Patient Instructions (Signed)
 Goal: Smoothly transition from rhythm-control (flecainide ) to rate-control (bisoprolol), which can reduce palpitations and help with anxiety/tremor.  Week Flecainide     Bisoprolol   Notes 1 50 mg AM, 25 mg PM   Start 2.5 mg daily  Add bisoprolol while lowering flecainide  2 25 mg BID    2.5-5 mg daily   Adjust bisoprolol based on HR/BP 3 25 mg AM only   5 mg daily (typical target) Flecainide  almost out 4 Stop     Continue 5 mg daily  Maintain bisoprolol long term

## 2024-03-29 ENCOUNTER — Other Ambulatory Visit (HOSPITAL_BASED_OUTPATIENT_CLINIC_OR_DEPARTMENT_OTHER): Payer: Self-pay

## 2024-04-03 MED ORDER — ATORVASTATIN CALCIUM 10 MG PO TABS
10.0000 mg | ORAL_TABLET | Freq: Every day | ORAL | 3 refills | Status: AC
Start: 2024-04-03 — End: 2025-04-03
  Filled 2024-04-03: qty 90, 90d supply, fill #0

## 2024-04-03 MED ORDER — BISOPROLOL FUMARATE 5 MG PO TABS
5.0000 mg | ORAL_TABLET | Freq: Every day | ORAL | 3 refills | Status: DC
  Filled 2024-04-03: qty 90, 90d supply, fill #0
  Filled 2024-06-10: qty 90, 90d supply, fill #1
  Filled 2024-07-24: qty 90, 90d supply, fill #2

## 2024-04-04 ENCOUNTER — Other Ambulatory Visit (HOSPITAL_BASED_OUTPATIENT_CLINIC_OR_DEPARTMENT_OTHER): Payer: Self-pay

## 2024-04-04 ENCOUNTER — Other Ambulatory Visit: Payer: Self-pay

## 2024-04-04 ENCOUNTER — Encounter: Payer: Self-pay | Admitting: Oncology

## 2024-04-16 ENCOUNTER — Encounter: Payer: Self-pay | Admitting: Nurse Practitioner

## 2024-04-16 NOTE — Assessment & Plan Note (Signed)
 Chronic palpitations currently managed with Flecainide . Unfortunately, despite the previous success of the medication, she has started to experience and up tick in palpitations and is also noting chest tightness/pressure and increased anxiety. We discussed that the Flecainide  may be contributing to the symptoms. Previous EKG's reviewed. Recommend slow titration off of Flecainide  after starting Bisoprolol  5mg  daily. We discussed that if her palpitations worsen to report this immediately and consider returning to the most recent dose of Flecainide  if she has tapered.

## 2024-04-16 NOTE — Assessment & Plan Note (Signed)
 Significant anxiety with intermittent panic symptoms related to life stressors. Her anxiety has recently increased to include daily fears for health and the health of her loved ones. It is not clear if a single event has triggered this. We discussed trial off of Flecainide  to see if this could be contributing, but also to allow us  to try medication for her anxiety symptoms. She will start the taper of Flecainide  after starting on Bisoprolol .

## 2024-04-16 NOTE — Assessment & Plan Note (Signed)
 Continue on statin therapy for best management and protection from CVD. Low fat, high fiber diet along with daily exercise also recommended.

## 2024-04-24 ENCOUNTER — Telehealth: Payer: Self-pay | Admitting: Nurse Practitioner

## 2024-04-24 NOTE — Telephone Encounter (Signed)
 [employer]MLA forms received by fax and sent back in folder  return to [facility]

## 2024-04-30 ENCOUNTER — Other Ambulatory Visit: Payer: Self-pay | Admitting: Nurse Practitioner

## 2024-04-30 ENCOUNTER — Encounter: Payer: Self-pay | Admitting: Oncology

## 2024-04-30 ENCOUNTER — Other Ambulatory Visit (HOSPITAL_BASED_OUTPATIENT_CLINIC_OR_DEPARTMENT_OTHER): Payer: Self-pay

## 2024-04-30 DIAGNOSIS — N946 Dysmenorrhea, unspecified: Secondary | ICD-10-CM

## 2024-04-30 MED ORDER — DROSPIRENONE-ETHINYL ESTRADIOL 3-0.02 MG PO TABS
1.0000 | ORAL_TABLET | Freq: Every day | ORAL | 3 refills | Status: AC
  Filled 2024-04-30: qty 84, 72d supply, fill #0
  Filled 2024-07-13: qty 84, 72d supply, fill #1

## 2024-05-02 ENCOUNTER — Other Ambulatory Visit: Payer: Self-pay | Admitting: Nurse Practitioner

## 2024-05-02 ENCOUNTER — Encounter: Payer: Self-pay | Admitting: Nurse Practitioner

## 2024-05-02 ENCOUNTER — Other Ambulatory Visit (HOSPITAL_BASED_OUTPATIENT_CLINIC_OR_DEPARTMENT_OTHER): Payer: Self-pay

## 2024-05-02 ENCOUNTER — Encounter: Payer: Self-pay | Admitting: Oncology

## 2024-05-02 DIAGNOSIS — M7591 Shoulder lesion, unspecified, right shoulder: Secondary | ICD-10-CM

## 2024-05-02 MED ORDER — PREDNISONE 20 MG PO TABS
20.0000 mg | ORAL_TABLET | Freq: Every day | ORAL | 0 refills | Status: DC
  Filled 2024-05-02: qty 5, 5d supply, fill #0

## 2024-05-02 MED ORDER — MELOXICAM 15 MG PO TABS
15.0000 mg | ORAL_TABLET | Freq: Every day | ORAL | 0 refills | Status: DC
  Filled 2024-05-02: qty 30, 30d supply, fill #0

## 2024-05-09 ENCOUNTER — Ambulatory Visit: Payer: Self-pay | Admitting: Nurse Practitioner

## 2024-05-09 ENCOUNTER — Other Ambulatory Visit: Payer: Self-pay | Admitting: Cardiology

## 2024-05-10 ENCOUNTER — Encounter: Payer: Self-pay | Admitting: Oncology

## 2024-05-10 ENCOUNTER — Other Ambulatory Visit (HOSPITAL_BASED_OUTPATIENT_CLINIC_OR_DEPARTMENT_OTHER): Payer: Self-pay

## 2024-05-10 ENCOUNTER — Other Ambulatory Visit: Payer: Self-pay

## 2024-05-10 MED ORDER — FLECAINIDE ACETATE 50 MG PO TABS
50.0000 mg | ORAL_TABLET | Freq: Two times a day (BID) | ORAL | 0 refills | Status: AC
  Filled 2024-05-10: qty 60, 30d supply, fill #0

## 2024-06-05 ENCOUNTER — Other Ambulatory Visit (HOSPITAL_BASED_OUTPATIENT_CLINIC_OR_DEPARTMENT_OTHER): Payer: Self-pay

## 2024-06-05 ENCOUNTER — Other Ambulatory Visit: Payer: Self-pay

## 2024-06-05 ENCOUNTER — Telehealth: Payer: Self-pay | Admitting: Nurse Practitioner

## 2024-06-05 ENCOUNTER — Encounter: Payer: Self-pay | Admitting: Oncology

## 2024-06-05 DIAGNOSIS — Z862 Personal history of diseases of the blood and blood-forming organs and certain disorders involving the immune mechanism: Secondary | ICD-10-CM

## 2024-06-05 DIAGNOSIS — K529 Noninfective gastroenteritis and colitis, unspecified: Secondary | ICD-10-CM

## 2024-06-05 MED ORDER — PREDNISONE 10 MG PO TABS
ORAL_TABLET | ORAL | 0 refills | Status: DC
  Filled 2024-06-05: qty 74, 35d supply, fill #0

## 2024-06-05 NOTE — Telephone Encounter (Signed)
 Patient contacted provider with concerns of intermittent soft stools with small amount of blood present on occasion.   Symptoms started on 10/31 with intermittent abdominal cramping and an increased frequency of bowel movements. Bowel movements were soft consistency, which is her normal. On 11/9 she noticed a small amount of BRB in the stool with 4 soft bowel movements that day. Since then, she has had less than 4 BM's per day and has noted a small amount of BRB in some of her BM, but not all. She is still having mild cramping.  She denies fever, chills, weakness, dizziness, weight loss, severe abdominal cramping, or tachycardia. She is eating and drinking normally. She is able to go to work.   Patient has a history of colitis and reports that symptoms are similar to her last episode. She has had no sick contacts. No one else in the household has similar symptoms. She has not traveled out of the country. She has not been on antibiotics recently.   Previous treatment success with prednisone  taper, but not mesalamine  treatment. Will send treatment today and schedule visit within the next few days for further evaluation.

## 2024-06-06 ENCOUNTER — Encounter: Payer: Self-pay | Admitting: Oncology

## 2024-06-06 ENCOUNTER — Other Ambulatory Visit (HOSPITAL_BASED_OUTPATIENT_CLINIC_OR_DEPARTMENT_OTHER): Payer: Self-pay

## 2024-06-06 NOTE — Addendum Note (Signed)
 Addended by: Rigoberto Repass, CAMIE E on: 06/06/2024 12:06 PM   Modules accepted: Orders

## 2024-06-07 ENCOUNTER — Other Ambulatory Visit: Payer: Self-pay

## 2024-06-07 DIAGNOSIS — Z862 Personal history of diseases of the blood and blood-forming organs and certain disorders involving the immune mechanism: Secondary | ICD-10-CM

## 2024-06-07 DIAGNOSIS — K529 Noninfective gastroenteritis and colitis, unspecified: Secondary | ICD-10-CM

## 2024-06-08 ENCOUNTER — Other Ambulatory Visit: Payer: Self-pay | Admitting: Nurse Practitioner

## 2024-06-08 ENCOUNTER — Other Ambulatory Visit (INDEPENDENT_AMBULATORY_CARE_PROVIDER_SITE_OTHER): Payer: Self-pay

## 2024-06-08 DIAGNOSIS — Z862 Personal history of diseases of the blood and blood-forming organs and certain disorders involving the immune mechanism: Secondary | ICD-10-CM

## 2024-06-08 DIAGNOSIS — K529 Noninfective gastroenteritis and colitis, unspecified: Secondary | ICD-10-CM

## 2024-06-09 LAB — CBC WITH DIFFERENTIAL/PLATELET
Basophils Absolute: 0.1 x10E3/uL (ref 0.0–0.2)
Basos: 0 %
EOS (ABSOLUTE): 0 x10E3/uL (ref 0.0–0.4)
Eos: 0 %
Hematocrit: 39.5 % (ref 34.0–46.6)
Hemoglobin: 12.9 g/dL (ref 11.1–15.9)
Immature Grans (Abs): 0.2 x10E3/uL — ABNORMAL HIGH (ref 0.0–0.1)
Immature Granulocytes: 1 %
Lymphocytes Absolute: 1.1 x10E3/uL (ref 0.7–3.1)
Lymphs: 8 %
MCH: 31.4 pg (ref 26.6–33.0)
MCHC: 32.7 g/dL (ref 31.5–35.7)
MCV: 96 fL (ref 79–97)
Monocytes Absolute: 0.2 x10E3/uL (ref 0.1–0.9)
Monocytes: 1 %
Neutrophils Absolute: 11.9 x10E3/uL — ABNORMAL HIGH (ref 1.4–7.0)
Neutrophils: 90 %
Platelets: 403 x10E3/uL (ref 150–450)
RBC: 4.11 x10E6/uL (ref 3.77–5.28)
RDW: 11.5 % — ABNORMAL LOW (ref 11.7–15.4)
WBC: 13.4 x10E3/uL — ABNORMAL HIGH (ref 3.4–10.8)

## 2024-06-09 LAB — PROTIME-INR
INR: 0.9 (ref 0.9–1.2)
Prothrombin Time: 9.8 s (ref 9.1–12.0)

## 2024-06-09 LAB — C-REACTIVE PROTEIN: CRP: 3 mg/L (ref 0–10)

## 2024-06-11 ENCOUNTER — Other Ambulatory Visit: Payer: Self-pay

## 2024-06-11 ENCOUNTER — Other Ambulatory Visit (HOSPITAL_BASED_OUTPATIENT_CLINIC_OR_DEPARTMENT_OTHER): Payer: Self-pay

## 2024-06-11 ENCOUNTER — Encounter: Payer: Self-pay | Admitting: Oncology

## 2024-06-11 ENCOUNTER — Ambulatory Visit: Payer: Self-pay | Admitting: Family Medicine

## 2024-07-13 ENCOUNTER — Other Ambulatory Visit (HOSPITAL_BASED_OUTPATIENT_CLINIC_OR_DEPARTMENT_OTHER): Payer: Self-pay

## 2024-07-24 ENCOUNTER — Other Ambulatory Visit (HOSPITAL_BASED_OUTPATIENT_CLINIC_OR_DEPARTMENT_OTHER): Payer: Self-pay

## 2024-07-24 ENCOUNTER — Other Ambulatory Visit: Payer: Self-pay | Admitting: Nurse Practitioner

## 2024-07-24 ENCOUNTER — Encounter: Payer: Self-pay | Admitting: Oncology

## 2024-07-24 DIAGNOSIS — I498 Other specified cardiac arrhythmias: Secondary | ICD-10-CM

## 2024-07-24 DIAGNOSIS — R002 Palpitations: Secondary | ICD-10-CM

## 2024-07-24 MED ORDER — BISOPROLOL FUMARATE 10 MG PO TABS
10.0000 mg | ORAL_TABLET | Freq: Every day | ORAL | 3 refills | Status: AC
  Filled 2024-07-24: qty 90, 90d supply, fill #0

## 2024-08-29 ENCOUNTER — Telehealth: Payer: Self-pay | Admitting: Nurse Practitioner

## 2024-08-29 NOTE — Telephone Encounter (Signed)
 [employer]ML[employer] forms received and sent back in folder  return to [employer] for form fee
